# Patient Record
Sex: Male | Born: 1970 | Race: White | Hispanic: No | Smoking: Former smoker
Health system: Southern US, Community
[De-identification: ages and names within clinical notes are randomized; demographics above are authoritative.]

## PROBLEM LIST (undated history)

## (undated) DIAGNOSIS — K219 Gastro-esophageal reflux disease without esophagitis: Secondary | ICD-10-CM

## (undated) DIAGNOSIS — K589 Irritable bowel syndrome without diarrhea: Secondary | ICD-10-CM

## (undated) DIAGNOSIS — K8500 Idiopathic acute pancreatitis without necrosis or infection: Secondary | ICD-10-CM

## (undated) DIAGNOSIS — Z87891 Personal history of nicotine dependence: Secondary | ICD-10-CM

## (undated) HISTORY — PX: CHOLECYSTECTOMY: SHX55

---
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
## 2018-09-21 DIAGNOSIS — R079 Chest pain, unspecified: Secondary | ICD-10-CM | POA: Insufficient documentation

## 2018-09-21 DIAGNOSIS — J189 Pneumonia, unspecified organism: Secondary | ICD-10-CM | POA: Insufficient documentation

## 2018-09-21 DIAGNOSIS — Z720 Tobacco use: Secondary | ICD-10-CM | POA: Diagnosis present

## 2019-04-20 DIAGNOSIS — Z87891 Personal history of nicotine dependence: Secondary | ICD-10-CM

## 2019-04-20 HISTORY — DX: Personal history of nicotine dependence: Z87.891

## 2019-04-24 DIAGNOSIS — R05 Cough: Secondary | ICD-10-CM | POA: Insufficient documentation

## 2019-04-24 DIAGNOSIS — R059 Cough, unspecified: Secondary | ICD-10-CM | POA: Insufficient documentation

## 2019-05-05 DIAGNOSIS — A419 Sepsis, unspecified organism: Secondary | ICD-10-CM | POA: Insufficient documentation

## 2019-05-06 DIAGNOSIS — R197 Diarrhea, unspecified: Secondary | ICD-10-CM | POA: Insufficient documentation

## 2019-05-16 DIAGNOSIS — D72829 Elevated white blood cell count, unspecified: Secondary | ICD-10-CM | POA: Insufficient documentation

## 2019-06-09 DIAGNOSIS — K8501 Idiopathic acute pancreatitis with uninfected necrosis: Secondary | ICD-10-CM | POA: Diagnosis present

## 2019-06-20 ENCOUNTER — Encounter (HOSPITAL_COMMUNITY): Payer: Self-pay

## 2019-06-20 ENCOUNTER — Emergency Department (HOSPITAL_COMMUNITY): Payer: Self-pay

## 2019-06-20 ENCOUNTER — Inpatient Hospital Stay (HOSPITAL_COMMUNITY)
Admission: EM | Admit: 2019-06-20 | Discharge: 2019-06-28 | DRG: 438 | Disposition: A | Discharge status: Home / Self Care | Payer: Self-pay | Attending: Internal Medicine | Admitting: Internal Medicine

## 2019-06-20 ENCOUNTER — Other Ambulatory Visit: Payer: Self-pay

## 2019-06-20 DIAGNOSIS — Z9889 Other specified postprocedural states: Secondary | ICD-10-CM

## 2019-06-20 DIAGNOSIS — Z87891 Personal history of nicotine dependence: Secondary | ICD-10-CM

## 2019-06-20 DIAGNOSIS — D473 Essential (hemorrhagic) thrombocythemia: Secondary | ICD-10-CM | POA: Diagnosis present

## 2019-06-20 DIAGNOSIS — D649 Anemia, unspecified: Secondary | ICD-10-CM | POA: Diagnosis present

## 2019-06-20 DIAGNOSIS — IMO0002 Reserved for concepts with insufficient information to code with codable children: Secondary | ICD-10-CM | POA: Diagnosis present

## 2019-06-20 DIAGNOSIS — R0600 Dyspnea, unspecified: Secondary | ICD-10-CM

## 2019-06-20 DIAGNOSIS — K589 Irritable bowel syndrome without diarrhea: Secondary | ICD-10-CM | POA: Insufficient documentation

## 2019-06-20 DIAGNOSIS — K582 Mixed irritable bowel syndrome: Secondary | ICD-10-CM | POA: Diagnosis present

## 2019-06-20 DIAGNOSIS — J918 Pleural effusion in other conditions classified elsewhere: Secondary | ICD-10-CM | POA: Diagnosis present

## 2019-06-20 DIAGNOSIS — K5900 Constipation, unspecified: Secondary | ICD-10-CM

## 2019-06-20 DIAGNOSIS — R7989 Other specified abnormal findings of blood chemistry: Secondary | ICD-10-CM | POA: Diagnosis present

## 2019-06-20 DIAGNOSIS — D75839 Thrombocytosis, unspecified: Secondary | ICD-10-CM | POA: Diagnosis present

## 2019-06-20 DIAGNOSIS — K859 Acute pancreatitis without necrosis or infection, unspecified: Secondary | ICD-10-CM

## 2019-06-20 DIAGNOSIS — Z79899 Other long term (current) drug therapy: Secondary | ICD-10-CM

## 2019-06-20 DIAGNOSIS — Z20828 Contact with and (suspected) exposure to other viral communicable diseases: Secondary | ICD-10-CM | POA: Diagnosis present

## 2019-06-20 DIAGNOSIS — R1084 Generalized abdominal pain: Secondary | ICD-10-CM

## 2019-06-20 DIAGNOSIS — Z888 Allergy status to other drugs, medicaments and biological substances status: Secondary | ICD-10-CM

## 2019-06-20 DIAGNOSIS — J90 Pleural effusion, not elsewhere classified: Secondary | ICD-10-CM | POA: Diagnosis present

## 2019-06-20 DIAGNOSIS — K219 Gastro-esophageal reflux disease without esophagitis: Secondary | ICD-10-CM | POA: Insufficient documentation

## 2019-06-20 DIAGNOSIS — E46 Unspecified protein-calorie malnutrition: Secondary | ICD-10-CM | POA: Diagnosis present

## 2019-06-20 DIAGNOSIS — K861 Other chronic pancreatitis: Secondary | ICD-10-CM | POA: Diagnosis present

## 2019-06-20 DIAGNOSIS — R079 Chest pain, unspecified: Secondary | ICD-10-CM

## 2019-06-20 DIAGNOSIS — R161 Splenomegaly, not elsewhere classified: Secondary | ICD-10-CM | POA: Diagnosis present

## 2019-06-20 DIAGNOSIS — I10 Essential (primary) hypertension: Secondary | ICD-10-CM | POA: Diagnosis present

## 2019-06-20 DIAGNOSIS — Z6821 Body mass index (BMI) 21.0-21.9, adult: Secondary | ICD-10-CM

## 2019-06-20 DIAGNOSIS — K8681 Exocrine pancreatic insufficiency: Secondary | ICD-10-CM | POA: Diagnosis present

## 2019-06-20 DIAGNOSIS — Z9049 Acquired absence of other specified parts of digestive tract: Secondary | ICD-10-CM

## 2019-06-20 DIAGNOSIS — Z885 Allergy status to narcotic agent status: Secondary | ICD-10-CM

## 2019-06-20 DIAGNOSIS — K8501 Idiopathic acute pancreatitis with uninfected necrosis: Principal | ICD-10-CM | POA: Diagnosis present

## 2019-06-20 DIAGNOSIS — R109 Unspecified abdominal pain: Secondary | ICD-10-CM

## 2019-06-20 DIAGNOSIS — J189 Pneumonia, unspecified organism: Secondary | ICD-10-CM | POA: Diagnosis present

## 2019-06-20 HISTORY — DX: Idiopathic acute pancreatitis without necrosis or infection: K85.00

## 2019-06-20 HISTORY — DX: Gastro-esophageal reflux disease without esophagitis: K21.9

## 2019-06-20 HISTORY — DX: Irritable bowel syndrome, unspecified: K58.9

## 2019-06-20 LAB — CBC WITH DIFFERENTIAL/PLATELET
Abs Immature Granulocytes: 0.15 10*3/uL — ABNORMAL HIGH (ref 0.00–0.07)
Basophils Absolute: 0.1 10*3/uL (ref 0.0–0.1)
Basophils Relative: 0 %
Eosinophils Absolute: 0.1 10*3/uL (ref 0.0–0.5)
Eosinophils Relative: 0 %
HCT: 44.5 % (ref 39.0–52.0)
Hemoglobin: 14.6 g/dL (ref 13.0–17.0)
Immature Granulocytes: 1 %
Lymphocytes Relative: 9 %
Lymphs Abs: 2.5 10*3/uL (ref 0.7–4.0)
MCH: 28.6 pg (ref 26.0–34.0)
MCHC: 32.8 g/dL (ref 30.0–36.0)
MCV: 87.1 fL (ref 80.0–100.0)
Monocytes Absolute: 1.4 10*3/uL — ABNORMAL HIGH (ref 0.1–1.0)
Monocytes Relative: 5 %
Neutro Abs: 25.2 10*3/uL — ABNORMAL HIGH (ref 1.7–7.7)
Neutrophils Relative %: 85 %
Platelets: 754 10*3/uL — ABNORMAL HIGH (ref 150–400)
RBC: 5.11 MIL/uL (ref 4.22–5.81)
RDW: 12.8 % (ref 11.5–15.5)
WBC: 29.6 10*3/uL — ABNORMAL HIGH (ref 4.0–10.5)
nRBC: 0 % (ref 0.0–0.2)

## 2019-06-20 LAB — COMPREHENSIVE METABOLIC PANEL
ALT: 50 U/L — ABNORMAL HIGH (ref 0–44)
AST: 32 U/L (ref 15–41)
Albumin: 2.5 g/dL — ABNORMAL LOW (ref 3.5–5.0)
Alkaline Phosphatase: 100 U/L (ref 38–126)
Anion gap: 13 (ref 5–15)
BUN: 7 mg/dL (ref 6–20)
CO2: 23 mmol/L (ref 22–32)
Calcium: 9.2 mg/dL (ref 8.9–10.3)
Chloride: 94 mmol/L — ABNORMAL LOW (ref 98–111)
Creatinine, Ser: 0.8 mg/dL (ref 0.61–1.24)
GFR calc Af Amer: >60 mL/min (ref >60)
GFR calc non Af Amer: >60 mL/min (ref >60)
Glucose, Bld: 151 mg/dL — ABNORMAL HIGH (ref 70–99)
Potassium: 4.1 mmol/L (ref 3.5–5.1)
Sodium: 130 mmol/L — ABNORMAL LOW (ref 135–145)
Total Bilirubin: 0.9 mg/dL (ref 0.3–1.2)
Total Protein: 6.8 g/dL (ref 6.5–8.1)

## 2019-06-20 LAB — TROPONIN I (HIGH SENSITIVITY): Troponin I (High Sensitivity): 6 ng/L (ref <18)

## 2019-06-20 LAB — LACTIC ACID, PLASMA: Lactic Acid, Venous: 1.8 mmol/L (ref 0.5–1.9)

## 2019-06-20 LAB — LIPASE, BLOOD: Lipase: 181 U/L — ABNORMAL HIGH (ref 11–51)

## 2019-06-20 LAB — SARS CORONAVIRUS 2 BY RT PCR (HOSPITAL ORDER, PERFORMED IN ~~LOC~~ HOSPITAL LAB): SARS Coronavirus 2: NEGATIVE

## 2019-06-20 MED ORDER — ACETAMINOPHEN 500 MG PO TABS
1000.0000 mg | ORAL_TABLET | Freq: Once | ORAL | Status: AC
  Administered 2019-06-20: 1000 mg via ORAL
  Filled 2019-06-20: qty 2

## 2019-06-20 MED ORDER — ACETAMINOPHEN 650 MG RE SUPP: 650.0000 mg | Freq: Four times a day (QID) | RECTAL | Status: DC | PRN

## 2019-06-20 MED ORDER — SODIUM CHLORIDE 0.9 % IV SOLN
1.0000 g | Freq: Once | INTRAVENOUS | Status: AC
  Administered 2019-06-20: 1 g via INTRAVENOUS
  Filled 2019-06-20: qty 10

## 2019-06-20 MED ORDER — HYDROMORPHONE HCL 1 MG/ML IJ SOLN
1.0000 mg | Freq: Once | INTRAMUSCULAR | Status: AC
  Administered 2019-06-20: 1 mg via INTRAVENOUS
  Filled 2019-06-20: qty 1

## 2019-06-20 MED ORDER — SODIUM CHLORIDE 0.9 % IV SOLN
500.0000 mg | Freq: Once | INTRAVENOUS | Status: AC
  Administered 2019-06-20: 500 mg via INTRAVENOUS
  Filled 2019-06-20: qty 500

## 2019-06-20 MED ORDER — IOHEXOL 300 MG/ML  SOLN
100.0000 mL | Freq: Once | INTRAMUSCULAR | Status: AC | PRN
  Administered 2019-06-20: 100 mL via INTRAVENOUS

## 2019-06-20 MED ORDER — DIPHENHYDRAMINE HCL 12.5 MG/5ML PO ELIX: 12.5000 mg | ORAL_SOLUTION | Freq: Four times a day (QID) | ORAL | Status: DC | PRN

## 2019-06-20 MED ORDER — ONDANSETRON HCL 4 MG/2ML IJ SOLN
4.0000 mg | Freq: Four times a day (QID) | INTRAMUSCULAR | Status: DC | PRN
  Administered 2019-06-21 – 2019-06-28 (×3): 4 mg via INTRAVENOUS
  Filled 2019-06-20 (×2): qty 2

## 2019-06-20 MED ORDER — ONDANSETRON HCL 4 MG/2ML IJ SOLN
4.0000 mg | Freq: Once | INTRAMUSCULAR | Status: AC
  Administered 2019-06-20: 4 mg via INTRAVENOUS

## 2019-06-20 MED ORDER — DIPHENHYDRAMINE HCL 50 MG/ML IJ SOLN: 12.5000 mg | Freq: Four times a day (QID) | INTRAMUSCULAR | Status: DC | PRN

## 2019-06-20 MED ORDER — LACTATED RINGERS IV BOLUS
1000.0000 mL | Freq: Once | INTRAVENOUS | Status: AC
  Administered 2019-06-21: 1000 mL via INTRAVENOUS

## 2019-06-20 MED ORDER — ACETAMINOPHEN 325 MG PO TABS
650.0000 mg | ORAL_TABLET | Freq: Four times a day (QID) | ORAL | Status: DC | PRN
  Administered 2019-06-21 – 2019-06-24 (×3): 650 mg via ORAL
  Filled 2019-06-20 (×3): qty 2

## 2019-06-20 MED ORDER — MORPHINE SULFATE 2 MG/ML IV SOLN
INTRAVENOUS | Status: DC
  Administered 2019-06-21: 15 mg via INTRAVENOUS
  Administered 2019-06-21: 04:00:00 via INTRAVENOUS
  Filled 2019-06-20 (×2): qty 30

## 2019-06-20 MED ORDER — LACTATED RINGERS IV BOLUS
1000.0000 mL | Freq: Once | INTRAVENOUS | Status: AC
  Administered 2019-06-20: 1000 mL via INTRAVENOUS

## 2019-06-20 MED ORDER — SODIUM CHLORIDE 0.9% FLUSH: 9.0000 mL | INTRAVENOUS | Status: DC | PRN

## 2019-06-20 MED ORDER — DEXTROSE-NACL 5-0.45 % IV SOLN
INTRAVENOUS | Status: DC
  Administered 2019-06-21: 01:00:00 via INTRAVENOUS

## 2019-06-20 MED ORDER — NALOXONE HCL 0.4 MG/ML IJ SOLN: 0.4000 mg | INTRAMUSCULAR | Status: DC | PRN

## 2019-06-20 MED ORDER — ENOXAPARIN SODIUM 40 MG/0.4ML ~~LOC~~ SOLN
40.0000 mg | Freq: Every day | SUBCUTANEOUS | Status: DC
  Administered 2019-06-21: 40 mg via SUBCUTANEOUS
  Filled 2019-06-20 (×4): qty 0.4

## 2019-06-20 MED ORDER — ONDANSETRON HCL 4 MG PO TABS
4.0000 mg | ORAL_TABLET | Freq: Four times a day (QID) | ORAL | Status: DC | PRN
  Administered 2019-06-24: 4 mg via ORAL
  Filled 2019-06-20: qty 1

## 2019-06-20 NOTE — ED Notes (Signed)
4mg zofran given

## 2019-06-20 NOTE — ED Provider Notes (Signed)
I have personally seen and examined the patient. I have reviewed the documentation on PMH/FH/Soc Hx. I have discussed the plan of care with the resident and patient.  I have reviewed and agree with the resident's documentation. Please see associated encounter note.  Briefly, the patient is a 48 y.o. male here with chest pain, abdominal pain.  Patient was routed here as concern for code STEMI however EKG shows sinus tachycardia.  There is no concern for ST elevation on EKG.  Cardiology and I agreed to call off STEMI.  Patient has mostly severe epigastric abdominal pain, left-sided pain.  Has a history of idiopathic pancreatitis which is likely cause of his symptoms.  Possibly infectious.  Patient has tachycardia, intense nausea and vomiting.  Tenderness throughout on exam of his abdomen.  Sepsis work-up was initiated.  Patient with leukocytosis of 30.  Lipase elevated at 181.  Lactic acid normal.  Chest x-ray concerning for pneumonia.  IV antibiotics started. Blood cx collected. Patient with CT scan concerning for pancreatitis with fluid collections but no major abscess.  No signs of pancreatic necrosis.  Mostly has a moderate size left-sided pleural effusion as well.  Also has bowel wall thickening concerning for infectious colitis.  Will admit to medicine for further sepsis care as patient tachycardic, leukocytosis with possibly multiple sources.  Will discuss with hospitalist about expanding antibiotic coverage.  This chart was dictated using voice recognition software.  Despite best efforts to proofread,  errors can occur which can change the documentation meaning.   .Critical Care Performed by: Lennice Sites, DO Authorized by: Lennice Sites, DO   Critical care provider statement:    Critical care time (minutes):  45   Critical care was necessary to treat or prevent imminent or life-threatening deterioration of the following conditions:  Sepsis   Critical care was time spent personally by me on the  following activities:  Blood draw for specimens, development of treatment plan with patient or surrogate, discussions with primary provider, evaluation of patient's response to treatment, examination of patient, obtaining history from patient or surrogate, ordering and performing treatments and interventions, ordering and review of laboratory studies, ordering and review of radiographic studies, pulse oximetry, review of old charts and re-evaluation of patient's condition   I assumed direction of critical care for this patient from another provider in my specialty: no        EKG Interpretation  Date/Time:  Saturday June 20 2019 19:01:13 EDT Ventricular Rate:  158 PR Interval:    QRS Duration: 80 QT Interval:  266 QTC Calculation: 432 R Axis:   25 Text Interpretation:  Sinus tachycardia Atrial premature complex Confirmed by Lennice Sites [phone]) on 06/20/2019 7:07:18 PM        Lennice Sites, DO 06/20/19 2152

## 2019-06-20 NOTE — ED Triage Notes (Signed)
Per [facility], pt called 911 for Chest pain with neck pain, shob, hx idiopathic pancreatitis(recently seen at [facility] for) and having generalized left side pain, in route to [place] saw elevation in lead 2 and diverted here and activated code stemi.

## 2019-06-20 NOTE — ED Provider Notes (Signed)
[facility] EMERGENCY DEPARTMENT Provider Note   CSN: [id number] Arrival date & time: 06/20/19  1857    History   Chief Complaint Chief Complaint  Patient presents with   Chest Pain   Abdominal Pain    HPI Kyle Mooney is a 48 y.o. male.     HPI EMS as code STEMI.  Per protocol cardiology and ED providers at bedside upon arrival.  EMS reports they were called out for concerns of chest pain.  Patient reported left-sided chest pain and then in route they saw possible ST elevation in lead II on the monitor.  Patient was also tachycardic.  Administered full dose aspirin and 1 nitro.  On my interview the patient states that he had waxing on and off upper abdominal and left chest and abdominal pain this morning.  Came and went.  Was improved with hydrocodone which he has at home, but then acutely worsened.  States that it feels different than his previous pancreatitis.  Is sharp in nature.  Worse with palpation and deep breathing.  Hurts to move.  Past Medical History:  Diagnosis Date   Ex-cigarette smoker 04/2019   GERD (gastroesophageal reflux disease)    IBS (irritable bowel syndrome)    Idiopathic pancreatitis     Patient Active Problem List   Diagnosis Date Noted   Chronic pancreatitis ([place]) 06/20/2019   Thrombocytosis ([place]) 06/20/2019   LLL pneumonia ([place]) 06/20/2019   Recurrent left pleural effusion 06/20/2019   GERD (gastroesophageal reflux disease) 06/20/2019   IBS (irritable bowel syndrome) 06/20/2019   Pancreatitis, recurrent 06/20/2019    Past Surgical History:  Procedure Laterality Date   CHOLECYSTECTOMY          Home Medications    Prior to Admission medications   Not on File    Family History History reviewed. No pertinent family history.  Social History Social History   Tobacco Use   Smoking status: Former Smoker    Types: Cigarettes    Quit date: 04/20/2019    Years since quitting: 0.1  Substance Use  Topics   Alcohol use: Never    Frequency: Never   Drug use: Never     Allergies   Codeine and Other   Review of Systems Review of Systems  Constitutional: Negative for chills and fever.  HENT: Negative for sore throat.   Eyes: Negative for pain and visual disturbance.  Respiratory: Positive for cough. Negative for shortness of breath.        Persistent dry cough  Cardiovascular: Positive for chest pain.  Gastrointestinal: Positive for abdominal pain.  Genitourinary: Negative for dysuria and hematuria.  Musculoskeletal: Negative for back pain.  Skin: Negative for color change and rash.  Neurological: Negative for seizures and syncope.  All other systems reviewed and are negative.    Physical Exam Updated Vital Signs BP (!) 131/95    Pulse (!) 114    Temp 98.5 F (36.9 C) (Oral)    Resp (!) 22    Ht 6\' 1"  (1.854 m)    Wt 68 kg    SpO2 94%    BMI 19.79 kg/m   Physical Exam Vitals signs and nursing note reviewed.  Constitutional:      General: He is in acute distress.     Appearance: He is well-developed.  HENT:     Head: Normocephalic and atraumatic.  Eyes:     Conjunctiva/sclera: Conjunctivae normal.  Neck:     Musculoskeletal: Neck supple.  Cardiovascular:  Rate and Rhythm: Normal rate and regular rhythm.     Heart sounds: No murmur.  Pulmonary:     Effort: Pulmonary effort is normal. No respiratory distress.     Breath sounds: Examination of the left-lower field reveals decreased breath sounds and rhonchi. Decreased breath sounds and rhonchi present.  Chest:     Chest wall: Tenderness present.     Comments: Tenderness left-sided lower palpation Abdominal:     Palpations: Abdomen is soft.     Tenderness: There is abdominal tenderness. There is guarding.     Comments: Tenderness palpation of the epigastrium and left upper quadrant  Skin:    General: Skin is warm and dry.  Neurological:     Mental Status: He is alert and oriented to person, place, and  time.  Psychiatric:        Mood and Affect: Mood is anxious.      ED Treatments / Results  Labs (all labs ordered are listed, but only abnormal results are displayed) Labs Reviewed  CBC WITH DIFFERENTIAL/PLATELET - Abnormal; Notable for the following components:      Result Value   WBC 29.6 (*)    Platelets 754 (*)    Neutro Abs 25.2 (*)    Monocytes Absolute 1.4 (*)    Abs Immature Granulocytes 0.15 (*)    All other components within normal limits  COMPREHENSIVE METABOLIC PANEL - Abnormal; Notable for the following components:   Sodium 130 (*)    Chloride 94 (*)    Glucose, Bld 151 (*)    Albumin 2.5 (*)    ALT 50 (*)    All other components within normal limits  LIPASE, BLOOD - Abnormal; Notable for the following components:   Lipase 181 (*)    All other components within normal limits  SARS CORONAVIRUS 2 (HOSPITAL ORDER, [place] [facility])  CULTURE, BLOOD (ROUTINE X 2)  CULTURE, BLOOD (ROUTINE X 2)  URINE CULTURE  LACTIC ACID, PLASMA  URINALYSIS, ROUTINE W REFLEX MICROSCOPIC  LACTIC ACID, PLASMA  HIV ANTIBODY (ROUTINE TESTING W REFLEX)  CBC  CREATININE, SERUM  CBC  PATHOLOGIST SMEAR REVIEW  FERRITIN  C-REACTIVE PROTEIN  FIBRINOGEN  LIPASE, BLOOD  AMYLASE  TROPONIN I (HIGH SENSITIVITY)  TROPONIN I (HIGH SENSITIVITY)    EKG EKG Interpretation  Date/Time:  Saturday June 20 2019 19:01:13 EDT Ventricular Rate:  158 PR Interval:    QRS Duration: 80 QT Interval:  266 QTC Calculation: 432 R Axis:   25 Text Interpretation:  Sinus tachycardia Atrial premature complex Confirmed by Lennice Sites [phone]) on 06/20/2019 7:07:18 PM   Radiology Ct Abdomen Pelvis W Contrast  Result Date: 06/20/2019 CLINICAL DATA:  Abdominal pain.  Neutropenia. EXAM: CT ABDOMEN AND PELVIS WITH CONTRAST TECHNIQUE: Multidetector CT imaging of the abdomen and pelvis was performed using the standard protocol following bolus administration of intravenous contrast.  CONTRAST:  159mL OMNIPAQUE IOHEXOL 300 MG/ML  SOLN COMPARISON:  None. FINDINGS: Lower chest: There is a moderate-sized left-sided pleural effusion with near complete collapse of the left lower lobe.The heart size is normal. Hepatobiliary: The liver is normal. Status post cholecystectomy.There is no biliary ductal dilation. Pancreas: There are multiple peripancreatic fluid collections the largest of which measures approximately 3.7 by 1.8 cm. The pancreas appears to enhance symmetrically. Spleen: The spleen is enlarged measuring approximately 14 cm craniocaudad. Adrenals/Urinary Tract: --Adrenal glands: No adrenal hemorrhage. --Right kidney/ureter: No hydronephrosis or perinephric hematoma. --Left kidney/ureter: There is no left-sided hydronephrosis. There is a  complex collection in the left posterior pararenal space measuring approximately 8.5 x 2.5 cm. --Urinary bladder: Unremarkable. Stomach/Bowel: --Stomach/Duodenum: There is some wall thickening of the stomach. --Small bowel: No dilatation or inflammation. --Colon: There are soft tissue densities along the descending colon measuring approximately 2.8 x 2.4 cm. There is a collection of the splenic flexure measuring 4.7 x 2.7 cm causing mass effect on the nearby:Marland Kitchen There is wall thickening of the transverse colon without evidence of an obstruction. --Appendix: Not visualized. No right lower quadrant inflammation or free fluid. Vascular/Lymphatic: Atherosclerotic changes are noted of the abdominal aorta without evidence of an abdominal aortic aneurysm. The portal vein and splenic vein remain patent. The splenic artery remains patent. --No retroperitoneal lymphadenopathy. --No mesenteric lymphadenopathy. --No pelvic or inguinal lymphadenopathy. Reproductive: Unremarkable Other: There is a small volume of free fluid in the pelvis. There are multiple scattered collections in the retroperitoneum and peritoneal cavity. A few these collections demonstrate mild rim  enhancement. For example in the left upper quadrant there is a 3.9 x 1.9 cm collection that demonstrates mild peripheral rim enhancement. There is a 3.8 by 4.4 cm collection in the region of the gallbladder fossa. Given the surgical clips in the gallbladder fossa this is favored to represent a loculated fluid collection as opposed to a remnant gallbladder. Musculoskeletal. No acute displaced fractures. IMPRESSION: 1. Overall findings concerning for pancreatitis with multiple loculated fluid collections as detailed above. Some of the smaller collections, for example in the left upper quadrant, demonstrate rim enhancement. An abscess is not excluded. There is no CT evidence for pancreatic necrosis. 2. Moderate-sized left-sided pleural effusion with at least partial collapse of the left lower lobe. 3. Diffuse wall thickening of the transverse colon and splenic flexure favored to be reactive. Other considerations include infectious or inflammatory colitis. 4. Splenomegaly.  The splenic vein remains patent. Electronically Signed   By: Constance Holster M.D.   On: 06/20/2019 21:07   Dg Chest Portable 1 View  Result Date: 06/20/2019 CLINICAL DATA:  Chest pain EXAM: PORTABLE CHEST 1 VIEW COMPARISON:  None. FINDINGS: There is airspace consolidation in the left base with left pleural effusion. Lungs elsewhere are clear. Heart size and pulmonary vascularity are normal. No adenopathy. No bone lesions. IMPRESSION: Left lower lobe airspace consolidation consistent with pneumonia. Small left pleural effusion. Lungs elsewhere clear. No adenopathy. Heart size normal. Followup PA and lateral chest radiographs recommended in 3-4 weeks following trial of antibiotic therapy to ensure resolution and exclude underlying malignancy. Electronically Signed   By: Lowella Grip III M.D.   On: 06/20/2019 19:32    Procedures Procedures (including critical care time)  Medications Ordered in ED Medications  enoxaparin (LOVENOX)  injection 40 mg (has no administration in time range)  dextrose 5 %-0.45 % sodium chloride infusion (has no administration in time range)  acetaminophen (TYLENOL) tablet 650 mg (650 mg Oral Given 06/21/19 0052)    Or  acetaminophen (TYLENOL) suppository 650 mg ( Rectal See Alternative 06/21/19 0052)  ondansetron (ZOFRAN) tablet 4 mg (has no administration in time range)    Or  ondansetron (ZOFRAN) injection 4 mg (has no administration in time range)  naloxone [facility]) injection 0.4 mg (has no administration in time range)    And  sodium chloride flush (NS) 0.9 % injection 9 mL (has no administration in time range)  diphenhydrAMINE (BENADRYL) injection 12.5 mg (has no administration in time range)    Or  diphenhydrAMINE (BENADRYL) 12.5 MG/5ML elixir 12.5 mg (has no administration  in time range)  morphine 2 mg/mL PCA injection (has no administration in time range)  cefTRIAXone (ROCEPHIN) 1 g in sodium chloride 0.9 % 100 mL IVPB (has no administration in time range)  azithromycin (ZITHROMAX) 250 mg in dextrose 5 % 125 mL IVPB (has no administration in time range)  HYDROmorphone (DILAUDID) injection 1 mg (1 mg Intravenous Given 06/20/19 1920)  lactated ringers bolus 1,000 mL (0 mLs Intravenous Stopped 06/20/19 2133)  ondansetron (ZOFRAN) injection 4 mg (4 mg Intravenous Given 06/20/19 1914)  cefTRIAXone (ROCEPHIN) 1 g in sodium chloride 0.9 % 100 mL IVPB (0 g Intravenous Stopped 06/21/19 0043)  azithromycin (ZITHROMAX) 500 mg in sodium chloride 0.9 % 250 mL IVPB (0 mg Intravenous Stopped 06/21/19 0102)  acetaminophen (TYLENOL) tablet 1,000 mg (1,000 mg Oral Given 06/20/19 2100)  lactated ringers bolus 1,000 mL (1,000 mLs Intravenous New Bag/Given 06/21/19 0002)  HYDROmorphone (DILAUDID) injection 1 mg (1 mg Intravenous Given 06/20/19 2055)  iohexol (OMNIPAQUE) 300 MG/ML solution 100 mL (100 mLs Intravenous Contrast Given 06/20/19 2041)     Initial Impression / Assessment and Plan / ED Course  I have reviewed the  triage vital signs and the nursing notes.  Pertinent labs & imaging results that were available during my care of the patient were reviewed by me and considered in my medical decision making (see chart for details).       Mr. Vacha is a 48 year old male with a history of idiopathic pancreatitis, IBS, and GERD.  He was recently admitted to [facility] with CT suggesting necrotic pancreatitis with extensive fluid collections.  Was admitted on 7/14 and discharged on 7/23 after tolerating p.o. without any surgical or GI intervention.  Arrival today because of pain was unclear.  After chest x-ray became concerned for effusion consistent with pneumonia especially when I saw that his leukocytosis had increased to 29,000.  He also had significant abdominal pain and elevated lipase.  His lipase is not impressive in and of itself, but this may be due to extensive pancreatic necrosis.  CT ultimately showed findings consistent with acute pancreatitis.  Patient was treated empirically with ceftriaxone and azithromycin for pneumonia, though this effusion may be secondary to pancreatitis.  Given IV fluids, Tylenol, IV narcotics.  Overall had good response.  Continue to be tachycardic but had significant provement in symptoms.  Subsequently admitted to the inpatient hospitalist service for continued observation and management.  Final Clinical Impressions(s) / ED Diagnoses   Final diagnoses:  Acute on chronic pancreatitis ([place])  Pleural effusion associated with pancreatitis    ED Discharge Orders    None       Tillie Fantasia, MD 06/21/19 0105    Lennice Sites, DO 06/21/19 1708

## 2019-06-20 NOTE — H&P (Signed)
History and Physical    Kyle Mooney SWF:[id number] DOB: 10/06/1971 DOA: 06/20/2019  PCP: Patient, No Pcp Per (Confirm with patient/family/NH records and if not entered, this has to be entered at [facility] point of entry) Patient coming from: Patient is coming from his home and [place]  I have personally briefly reviewed patient's old medical records in [place]  Chief Complaint: Worsening abdominal pain and left flank and back pain  HPI: Kyle Mooney is a 48 y.o. male with medical history significant of recurrent pancreatitis.  He has had 5 prior hospitalizations since May 2020.  Most recent hospitalization was at [facility] 7/14 to 06/11/2019 for a diagnosis of idiopathic necrotizing pancreatitis.  The patient was seen by GI and surgical services.  He had MR CP which confirmed his necrotizing pancreatitis and also revealed multiple fluid collections in the peripancreatic region and throughout the abdomen.  He was not felt to be a candidate for a bile duct stent.  Patient during his hospitalization had a thrombocytosis which was thought to be reactive.  He had a leukocytosis also thought to be reactive.  Since being home the patient has continued to have abdominal discomfort.  He has had poor p.o. intake.  The day of admission he had a sudden increase in pain in the left flank and left lower back.  His abdominal pain also became more intense.  He was discharged from [facility] with a prescription for oxycodone and he did take this today.  Patient called EMS and asked to be taken to [facility] but he was taken instead to [facility].  He reports that his pain is severe at a 10/10 particularly in the left upper quadrant of the abdomen.  Does admit to having had a cough productive of a grayish-greenish sputum.  He denies any frank shortness of breath.    ED Course: In transfer the patient was thought to have cardiac symptoms.  Defibrillator pads were attached.   No medications were given.   In the ED his EKG was unremarkable for acute changes and his initial troponin was unremarkable.  Lab work did reveal an elevated lipase at 181.  He had a marked leukocytosis at 29,500 with a normal differential.  CT scan of the abdomen revealed changes consistent with pancreatitis.  He had multiple fluid collections in the peripancreatic region and also throughout the abdomen.  There were several thick walled fluid collections but no evidence of abscess.  The transverse colon was somewhat thickened.  The spleen was enlarged and measured 14.5 cm.  There was a moderate left pleural effusion and partial collapse of the left lung.  Chest x-ray revealed left lower lobe density consistent with pneumonia and a small left pleural effusion.  [facility] were called to admit the patient for recurrent pancreatitis and left lower lobe pneumonia.  In the ED the patient was given a dose of ceftriaxone and azithromycin.  Review of Systems: As per HPI otherwise 10 point review of systems negative.  Patient does report an almost 50 pound weight loss since May.   Past Medical History:  Diagnosis Date   Ex-cigarette smoker 04/2019   GERD (gastroesophageal reflux disease)    IBS (irritable bowel syndrome)    Idiopathic pancreatitis     Past Surgical History:  Procedure Laterality Date   CHOLECYSTECTOMY       reports that he quit smoking about 2 months ago. His smoking use included cigarettes. He does not have any smokeless tobacco history on file.  He reports that he does not drink alcohol or use drugs.  Allergies  Allergen Reactions   Codeine    Other     prolixa    History reviewed. No pertinent family history.  Social Hx - HSG. Married x 2: he had a son and two daughters from first marriage. His son died 2/2 complications of DM and opioid abuse. Married for 8 years to 2nd wife but recently seperated since his illness. 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  No family in [place].  Prior to Admission medications   Not on File    Physical Exam: Vitals:   06/20/19 2104 06/20/19 2105 06/20/19 2200 06/20/19 2300  BP:   (!) 142/96 (!) 131/95  Pulse: (!) 122  (!) 120 (!) 114  Resp:  (!) 22 (!) 31 (!) 22  Temp:      TempSrc:      SpO2: 98%  93% 94%  Weight:      Height:        Constitutional: NAD, calm, comfortable Vitals:   06/20/19 2104 06/20/19 2105 06/20/19 2200 06/20/19 2300  BP:   (!) 142/96 (!) 131/95  Pulse: (!) 122  (!) 120 (!) 114  Resp:  (!) 22 (!) 31 (!) 22  Temp:      TempSrc:      SpO2: 98%  93% 94%  Weight:      Height:       General  Chronically ill-appearing gentleman who is uncomfortable  eyes: PERRL, lids and conjunctivae normal without icterus ENMT: Mucous membranes are moist. Posterior pharynx clear of any exudate or lesions. dentition. -Fair condition Neck: normal, supple, no masses, no thyromegaly Respiratory: Right lung is clear with no rales or rhonchi.  Absent breath sounds at the left base up midway of the chest.  No wheezing is noted no rales are noted  cardiovascular: Regular tachycardia,  no murmurs / rubs / gallops. No extremity edema. 2+ pedal pulses. No carotid bruits.  Abdomen: Absent to hypoactive bowel sounds throughout.  Diffuse abdominal tenderness and protuberance.  Greatest point of tenderness is in the epigastrium.  Patient is very tender in the left upper quadrant.  Patient does guard, does have rebound.  Could not palpate his spleen edge secondary to tenderness.  Genitalia exam deferred Musculoskeletal: no clubbing / cyanosis. No joint deformity upper and lower extremities. Good ROM, no contractures. Normal muscle tone.  Skin: no rashes, lesions, ulcers. No induration.  Chronic scarring, small dark brown circular lesions, distal lower extremity Neurologic: CN 2-12 grossly intact. Sensation intact,  Strength 5/5 in all 4.  Psychiatric: Normal judgment and insight. Alert and oriented x 3. Normal mood.       Labs on Admission: I have personally reviewed following labs and imaging studies  CBC: Recent Labs  Lab 06/20/19 1915  WBC 29.6*  NEUTROABS 25.2*  HGB 14.6  HCT 44.5  MCV 87.1  PLT 093*   Basic Metabolic Panel: Recent Labs  Lab 06/20/19 1915  NA 130*  K 4.1  CL 94*  CO2 23  GLUCOSE 151*  BUN 7  CREATININE 0.80  CALCIUM 9.2   GFR: Estimated Creatinine Clearance: 109.8 mL/min (by C-G formula based on SCr of 0.8 mg/dL). Liver Function Tests: Recent Labs  Lab 06/20/19 1915  AST 32  ALT 50*  ALKPHOS 100  BILITOT 0.9  PROT 6.8  ALBUMIN 2.5*   Recent Labs  Lab 06/20/19 1915  LIPASE 181*  No results for input(s): AMMONIA in the last 168 hours. Coagulation Profile: No results for input(s): INR, PROTIME in the last 168 hours. Cardiac Enzymes: No results for input(s): CKTOTAL, CKMB, CKMBINDEX, TROPONINI in the last 168 hours. BNP (last 3 results) No results for input(s): PROBNP in the last 8760 hours. HbA1C: No results for input(s): HGBA1C in the last 72 hours. CBG: No results for input(s): GLUCAP in the last 168 hours. Lipid Profile: No results for input(s): CHOL, HDL, LDLCALC, TRIG, CHOLHDL, LDLDIRECT in the last 72 hours. Thyroid Function Tests: No results for input(s): TSH, T4TOTAL, FREET4, T3FREE, THYROIDAB in the last 72 hours. Anemia Panel: No results for input(s): VITAMINB12, FOLATE, FERRITIN, TIBC, IRON, RETICCTPCT in the last 72 hours. Urine analysis: No results found for: COLORURINE, APPEARANCEUR, [place], [place], GLUCOSEU, HGBUR, BILIRUBINUR, KETONESUR, PROTEINUR, UROBILINOGEN, NITRITE, LEUKOCYTESUR  Radiological Exams on Admission: Ct Abdomen Pelvis W Contrast  Result Date: 06/20/2019 CLINICAL DATA:  Abdominal pain.  Neutropenia. EXAM: CT ABDOMEN AND PELVIS WITH CONTRAST TECHNIQUE: Multidetector CT imaging of the abdomen and pelvis was performed using the standard protocol following bolus administration of intravenous contrast. CONTRAST:   180mL OMNIPAQUE IOHEXOL 300 MG/ML  SOLN COMPARISON:  None. FINDINGS: Lower chest: There is a moderate-sized left-sided pleural effusion with near complete collapse of the left lower lobe.The heart size is normal. Hepatobiliary: The liver is normal. Status post cholecystectomy.There is no biliary ductal dilation. Pancreas: There are multiple peripancreatic fluid collections the largest of which measures approximately 3.7 by 1.8 cm. The pancreas appears to enhance symmetrically. Spleen: The spleen is enlarged measuring approximately 14 cm craniocaudad. Adrenals/Urinary Tract: --Adrenal glands: No adrenal hemorrhage. --Right kidney/ureter: No hydronephrosis or perinephric hematoma. --Left kidney/ureter: There is no left-sided hydronephrosis. There is a complex collection in the left posterior pararenal space measuring approximately 8.5 x 2.5 cm. --Urinary bladder: Unremarkable. Stomach/Bowel: --Stomach/Duodenum: There is some wall thickening of the stomach. --Small bowel: No dilatation or inflammation. --Colon: There are soft tissue densities along the descending colon measuring approximately 2.8 x 2.4 cm. There is a collection of the splenic flexure measuring 4.7 x 2.7 cm causing mass effect on the nearby:Marland Kitchen There is wall thickening of the transverse colon without evidence of an obstruction. --Appendix: Not visualized. No right lower quadrant inflammation or free fluid. Vascular/Lymphatic: Atherosclerotic changes are noted of the abdominal aorta without evidence of an abdominal aortic aneurysm. The portal vein and splenic vein remain patent. The splenic artery remains patent. --No retroperitoneal lymphadenopathy. --No mesenteric lymphadenopathy. --No pelvic or inguinal lymphadenopathy. Reproductive: Unremarkable Other: There is a small volume of free fluid in the pelvis. There are multiple scattered collections in the retroperitoneum and peritoneal cavity. A few these collections demonstrate mild rim enhancement. For  example in the left upper quadrant there is a 3.9 x 1.9 cm collection that demonstrates mild peripheral rim enhancement. There is a 3.8 by 4.4 cm collection in the region of the gallbladder fossa. Given the surgical clips in the gallbladder fossa this is favored to represent a loculated fluid collection as opposed to a remnant gallbladder. Musculoskeletal. No acute displaced fractures. IMPRESSION: 1. Overall findings concerning for pancreatitis with multiple loculated fluid collections as detailed above. Some of the smaller collections, for example in the left upper quadrant, demonstrate rim enhancement. An abscess is not excluded. There is no CT evidence for pancreatic necrosis. 2. Moderate-sized left-sided pleural effusion with at least partial collapse of the left lower lobe. 3. Diffuse wall thickening of the transverse colon and splenic flexure favored to be  reactive. Other considerations include infectious or inflammatory colitis. 4. Splenomegaly.  The splenic vein remains patent. Electronically Signed   By: Constance Holster M.D.   On: 06/20/2019 21:07   Dg Chest Portable 1 View  Result Date: 06/20/2019 CLINICAL DATA:  Chest pain EXAM: PORTABLE CHEST 1 VIEW COMPARISON:  None. FINDINGS: There is airspace consolidation in the left base with left pleural effusion. Lungs elsewhere are clear. Heart size and pulmonary vascularity are normal. No adenopathy. No bone lesions. IMPRESSION: Left lower lobe airspace consolidation consistent with pneumonia. Small left pleural effusion. Lungs elsewhere clear. No adenopathy. Heart size normal. Followup PA and lateral chest radiographs recommended in 3-4 weeks following trial of antibiotic therapy to ensure resolution and exclude underlying malignancy. Electronically Signed   By: Lowella Grip III M.D.   On: 06/20/2019 19:32    EKG: Independently reviewed.  Sinus tachycardia with atrial premature beats.  No acute changes  Assessment/Plan Active Problems:    Chronic pancreatitis (HCC)   LLL pneumonia (HCC)   Recurrent left pleural effusion   Thrombocytosis (HCC)   Pancreatitis, recurrent  (please populate well all problems here in Problem List. (For example, if patient is on BP meds at home and you resume or decide to hold them, it is a problem that needs to be her. Same for CAD, COPD, HLD and so on)   1.  Recurrent/chronic pancreatitis -this is the patient's sixth hospitalization since May.  At his last admission to [facility] he was diagnosed as having necrotizing pancreatitis with multiple areas of loculation in the peripancreatic region and throughout the abdomen.  He is status post cholecystectomy and has no evidence of common bile duct stone by MRCP.  He does not have a history of heavy alcohol consumption and does not drink in the recent past.  His pancreatitis is idiopathic.  At his last hospitalization a common bile duct stent was discussed but he was not felt a candidate for that procedure at that time. Plan IV hydration while n.p.o.  Pain control using a PCA pump  GI consult -to be called in the a.m.  2.  Thrombocytosis -patient with a marked thrombocytosis at 754,000.  His hospital record from [facility] revealed he had a persistent thrombocytosis at that time that was thought to be reactive.  Patient now has splenomegaly.  Would be concerned for underlying hematologic disorder. Plan pathologist to review blood smear  Additional lab work to include a ferritin CRP fibrinogen  3.  Left lower lobe pneumonia -by chest x-ray and CT scan the patient has some compressive loss of lung volume secondary to a moderate pleural effusion and infiltrative  density at the left lower lobe.  Patient is afebrile but does have a leukocytosis. Plan continue ceftriaxone and azithromycin  Patient may come to thoracentesis for both diagnostic and therapeutic reasons  DVT prophylaxis: Lovenox (Lovenox/Heparin/SCD's/anticoagulated/None (if comfort care) Code  Status: Code (Full/Partial (specify details) Family Communication: Patient had no family he wanted contacted.  He does understand his diagnosis and plan of treatment and is agreeable (Specify name, relationship. Do not write "discussed with patient". Specify tel # if discussed over the phone) Disposition Plan: Home in 5 to 7 days (specify when and where you expect patient to be discharged) Consults called: GI consult to be called in a.m. (with names) Admission status: Inpatient (inpatient / obs / tele / medical floor / SDU)   Adella Hare MD [facility] Pager [phone]  If 7PM-7AM, please contact night-coverage [email or web address] Password [email or web address]  06/21/2019, 12:07 AM

## 2019-06-20 NOTE — ED Notes (Signed)
Patient transported to CT 

## 2019-06-20 NOTE — ED Notes (Signed)
Stemi cancelled by Cardiology.

## 2019-06-21 ENCOUNTER — Encounter (HOSPITAL_COMMUNITY): Payer: Self-pay | Admitting: Internal Medicine

## 2019-06-21 ENCOUNTER — Inpatient Hospital Stay (HOSPITAL_COMMUNITY): Payer: Self-pay

## 2019-06-21 DIAGNOSIS — R935 Abnormal findings on diagnostic imaging of other abdominal regions, including retroperitoneum: Secondary | ICD-10-CM

## 2019-06-21 DIAGNOSIS — K869 Disease of pancreas, unspecified: Secondary | ICD-10-CM

## 2019-06-21 DIAGNOSIS — J181 Lobar pneumonia, unspecified organism: Secondary | ICD-10-CM

## 2019-06-21 DIAGNOSIS — K8501 Idiopathic acute pancreatitis with uninfected necrosis: Principal | ICD-10-CM

## 2019-06-21 LAB — CBC
HCT: 37.5 % — ABNORMAL LOW (ref 39.0–52.0)
Hemoglobin: 11.9 g/dL — ABNORMAL LOW (ref 13.0–17.0)
MCH: 28.3 pg (ref 26.0–34.0)
MCHC: 31.7 g/dL (ref 30.0–36.0)
MCV: 89.3 fL (ref 80.0–100.0)
Platelets: 564 10*3/uL — ABNORMAL HIGH (ref 150–400)
RBC: 4.2 MIL/uL — ABNORMAL LOW (ref 4.22–5.81)
RDW: 13 % (ref 11.5–15.5)
WBC: 21.1 10*3/uL — ABNORMAL HIGH (ref 4.0–10.5)
nRBC: 0 % (ref 0.0–0.2)

## 2019-06-21 LAB — URINALYSIS, ROUTINE W REFLEX MICROSCOPIC
Bacteria, UA: NONE SEEN
Bilirubin Urine: NEGATIVE
Glucose, UA: NEGATIVE mg/dL
Hgb urine dipstick: NEGATIVE
Ketones, ur: NEGATIVE mg/dL
Leukocytes,Ua: NEGATIVE
Nitrite: NEGATIVE
Protein, ur: 30 mg/dL — AB
Specific Gravity, Urine: >1.046 — ABNORMAL HIGH (ref 1.005–1.030)
pH: 5 (ref 5.0–8.0)

## 2019-06-21 LAB — HIV ANTIBODY (ROUTINE TESTING W REFLEX): HIV Screen 4th Generation wRfx: NONREACTIVE

## 2019-06-21 LAB — CREATININE, SERUM
Creatinine, Ser: 0.73 mg/dL (ref 0.61–1.24)
GFR calc Af Amer: >60 mL/min (ref >60)
GFR calc non Af Amer: >60 mL/min (ref >60)

## 2019-06-21 LAB — FERRITIN: Ferritin: 553 ng/mL — ABNORMAL HIGH (ref 24–336)

## 2019-06-21 LAB — FIBRINOGEN: Fibrinogen: 545 mg/dL — ABNORMAL HIGH (ref 210–475)

## 2019-06-21 LAB — C-REACTIVE PROTEIN: CRP: 12.5 mg/dL — ABNORMAL HIGH (ref <1.0)

## 2019-06-21 LAB — TROPONIN I (HIGH SENSITIVITY)
Troponin I (High Sensitivity): 64 ng/L — ABNORMAL HIGH (ref <18)
Troponin I (High Sensitivity): 8 ng/L (ref <18)
Troponin I (High Sensitivity): 5 ng/L (ref <18)

## 2019-06-21 LAB — LACTIC ACID, PLASMA: Lactic Acid, Venous: 1.3 mmol/L (ref 0.5–1.9)

## 2019-06-21 LAB — AMYLASE: Amylase: 354 U/L — ABNORMAL HIGH (ref 28–100)

## 2019-06-21 LAB — LIPASE, BLOOD: Lipase: 161 U/L — ABNORMAL HIGH (ref 11–51)

## 2019-06-21 MED ORDER — ENSURE ENLIVE PO LIQD
237.0000 mL | Freq: Two times a day (BID) | ORAL | Status: DC
  Administered 2019-06-22 – 2019-06-26 (×4): 237 mL via ORAL

## 2019-06-21 MED ORDER — DIPHENHYDRAMINE HCL 12.5 MG/5ML PO ELIX: 12.5000 mg | ORAL_SOLUTION | Freq: Four times a day (QID) | ORAL | Status: DC | PRN

## 2019-06-21 MED ORDER — ONDANSETRON HCL 4 MG/2ML IJ SOLN
4.0000 mg | Freq: Four times a day (QID) | INTRAMUSCULAR | Status: DC | PRN
  Filled 2019-06-21: qty 2

## 2019-06-21 MED ORDER — HYDROMORPHONE HCL 1 MG/ML IJ SOLN
1.0000 mg | INTRAMUSCULAR | Status: DC | PRN
  Administered 2019-06-21: 1 mg via INTRAVENOUS
  Filled 2019-06-21: qty 1

## 2019-06-21 MED ORDER — SODIUM CHLORIDE 0.9 % IV SOLN
INTRAVENOUS | Status: DC
  Administered 2019-06-21 – 2019-06-25 (×10): via INTRAVENOUS

## 2019-06-21 MED ORDER — SODIUM CHLORIDE 0.9 % IV BOLUS
2000.0000 mL | Freq: Once | INTRAVENOUS | Status: AC
  Administered 2019-06-21: 2000 mL via INTRAVENOUS

## 2019-06-21 MED ORDER — DEXTROSE 5 % IV SOLN
250.0000 mg | INTRAVENOUS | Status: AC
  Administered 2019-06-21 – 2019-06-24 (×4): 250 mg via INTRAVENOUS
  Filled 2019-06-21 (×4): qty 250

## 2019-06-21 MED ORDER — MORPHINE SULFATE (PF) 4 MG/ML IV SOLN
4.0000 mg | INTRAVENOUS | Status: DC | PRN
  Administered 2019-06-21: 4 mg via INTRAVENOUS
  Filled 2019-06-21: qty 1

## 2019-06-21 MED ORDER — SODIUM CHLORIDE 0.9 % IV SOLN
1.0000 g | INTRAVENOUS | Status: DC
  Filled 2019-06-21: qty 10

## 2019-06-21 MED ORDER — MORPHINE SULFATE 2 MG/ML IV SOLN
INTRAVENOUS | Status: DC
  Administered 2019-06-21: 19.28 mg via INTRAVENOUS
  Administered 2019-06-21: 14 mg via INTRAVENOUS
  Administered 2019-06-21: 22 mg via INTRAVENOUS
  Administered 2019-06-21: 24 mg via INTRAVENOUS
  Administered 2019-06-21: 10:00:00 via INTRAVENOUS
  Administered 2019-06-22: 6 mg via INTRAVENOUS
  Administered 2019-06-22: 20 mg via INTRAVENOUS
  Administered 2019-06-22: 30 mg via INTRAVENOUS
  Administered 2019-06-22: 20 mg via INTRAVENOUS
  Administered 2019-06-22: 30 mg via INTRAVENOUS
  Administered 2019-06-23: 16 mg via INTRAVENOUS
  Administered 2019-06-23: 14 mg via INTRAVENOUS
  Administered 2019-06-23: 16.97 mg via INTRAVENOUS
  Administered 2019-06-23: 16 mg via INTRAVENOUS
  Administered 2019-06-23: 10 mg via INTRAVENOUS
  Administered 2019-06-23: 22 mg via INTRAVENOUS
  Administered 2019-06-24: 04:00:00 via INTRAVENOUS
  Administered 2019-06-24: 2 mg via INTRAVENOUS
  Administered 2019-06-24: 6 mg via INTRAVENOUS
  Administered 2019-06-24: 24 mg via INTRAVENOUS
  Filled 2019-06-21 (×6): qty 30

## 2019-06-21 MED ORDER — KETOROLAC TROMETHAMINE 30 MG/ML IJ SOLN
30.0000 mg | Freq: Four times a day (QID) | INTRAMUSCULAR | Status: DC
  Administered 2019-06-21 – 2019-06-22 (×4): 30 mg via INTRAVENOUS
  Filled 2019-06-21 (×4): qty 1

## 2019-06-21 MED ORDER — PIPERACILLIN-TAZOBACTAM 3.375 G IVPB
3.3750 g | Freq: Three times a day (TID) | INTRAVENOUS | Status: DC
  Administered 2019-06-21 – 2019-06-26 (×15): 3.375 g via INTRAVENOUS
  Filled 2019-06-21 (×14): qty 50

## 2019-06-21 MED ORDER — MORPHINE SULFATE (PF) 4 MG/ML IV SOLN: 4.0000 mg | INTRAVENOUS | Status: DC | PRN

## 2019-06-21 MED ORDER — BACLOFEN 10 MG PO TABS
5.0000 mg | ORAL_TABLET | Freq: Three times a day (TID) | ORAL | Status: DC
  Administered 2019-06-21 – 2019-06-22 (×3): 5 mg via ORAL
  Filled 2019-06-21 (×3): qty 1

## 2019-06-21 MED ORDER — SODIUM CHLORIDE 0.9% FLUSH: 9.0000 mL | INTRAVENOUS | Status: DC | PRN

## 2019-06-21 MED ORDER — NALOXONE HCL 0.4 MG/ML IJ SOLN: 0.4000 mg | INTRAMUSCULAR | Status: DC | PRN

## 2019-06-21 MED ORDER — PANTOPRAZOLE SODIUM 40 MG IV SOLR
40.0000 mg | Freq: Every day | INTRAVENOUS | Status: DC
  Administered 2019-06-21 – 2019-06-22 (×2): 40 mg via INTRAVENOUS
  Filled 2019-06-21 (×2): qty 40

## 2019-06-21 MED ORDER — DIPHENHYDRAMINE HCL 50 MG/ML IJ SOLN: 12.5000 mg | Freq: Four times a day (QID) | INTRAMUSCULAR | Status: DC | PRN

## 2019-06-21 NOTE — Progress Notes (Signed)
At approximately 0730 Patient c/o 14/10 abdominal pain, burning sensation and chest pain. Patient heart rate sustained in 120's. PCA transfusing per orders. MD paged and notified. New orders received. Will continue to monitor closely for remainder of shift.

## 2019-06-21 NOTE — Progress Notes (Signed)
Per HPI: Kyle Mooney is a 48 y.o. male with medical history significant of recurrent pancreatitis.  He has had 5 prior hospitalizations since May 2020.  Most recent hospitalization was at [facility] 7/14 to 06/11/2019 for a diagnosis of idiopathic necrotizing pancreatitis.  The patient was seen by GI and surgical services.  He had MR CP which confirmed his necrotizing pancreatitis and also revealed multiple fluid collections in the peripancreatic region and throughout the abdomen.  He was not felt to be a candidate for a bile duct stent.  Patient during his hospitalization had a thrombocytosis which was thought to be reactive.  He had a leukocytosis also thought to be reactive.  Since being home the patient has continued to have abdominal discomfort.  He has had poor p.o. intake.  The day of admission he had a sudden increase in pain in the left flank and left lower back.  His abdominal pain also became more intense.  He was discharged from [facility] with a prescription for oxycodone and he did take this today.  Patient called EMS and asked to be taken to [facility] but he was taken instead to [facility].  He reports that his pain is severe at a 10/10 particularly in the left upper quadrant of the abdomen.  Does admit to having had a cough productive of a grayish-greenish sputum.  He denies any frank shortness of breath.    I have seen and evaluated the patient at bedside.  Patient was admitted for recurrent acute on chronic pancreatitis with recent hospitalization at [facility] where he was noted to have necrotizing pancreatitis.  He has also had recent MRCP and placement of a bile duct stent was discussed at his last hospitalization, but was not performed.  We will plan to maintain on morphine PCA for pain control and remain n.p.o.  He has only received 1 L fluid bolus in the ED and I will bolus another 2 L of normal saline at this time and increase IV fluid infusion rate after the bolus.   Change antibiotics from Rocephin to Zosyn to include abdominal coverage and still maintain on azithromycin for atypical coverage could be related to left lower lobe pneumonia.  Will consult GI to assist with management.  Currently, without shortness of breath or hypoxemia and does not appear to require thoracentesis just yet, but may consider during this hospitalization.  Repeat a.m. labs ordered.  Total care time: 35 minutes.

## 2019-06-21 NOTE — Progress Notes (Signed)
Wasted 40ml morphine from PCA into steri cycle with Designer, television/film set.

## 2019-06-21 NOTE — Plan of Care (Signed)
?  Problem: Elimination: ?Goal: Will not experience complications related to urinary retention ?Outcome: Progressing ?  ?

## 2019-06-21 NOTE — ED Notes (Signed)
ED TO INPATIENT HANDOFF REPORT  ED Nurse Name and Phone #: Caprice Kluver [phone]  S Name/Age/Gender Gerald Stabs 48 y.o. male Room/Bed: 038C/038C  Code Status   Code Status: Full Code  Home/SNF/Other Home Patient oriented to: self, place, time and situation Is this baseline? Yes   Triage Complete: Triage complete  Chief Complaint Stemi  Triage Note Per [facility], pt called 911 for Chest pain with neck pain, shob, hx idiopathic pancreatitis(recently seen at [facility]) and having generalized left side pain, in route to [facility] saw elevation in lead 2 and diverted here and activated code stemi.    Allergies Allergies  Allergen Reactions  . Codeine   . Other     prolixa    Level of Care/Admitting Diagnosis ED Disposition    ED Disposition Condition Comment   Admit  Hospital Area: [place] [[id number]]  Level of Care: Med-Surg [16]  Covid Evaluation: Confirmed COVID Negative  Diagnosis: Pancreatitis, recurrent [[id number]]  Admitting Physician: Neena Rhymes [[id number]]  Attending Physician: Adella Hare E [[id number]]  Estimated length of stay: 3 - 4 days  Certification:: I certify this patient will need inpatient services for at least 2 midnights  PT Class (Do Not Modify): Inpatient [101]  PT Acc Code (Do Not Modify): Private [1]       B Medical/Surgery History Past Medical History:  Diagnosis Date  . Ex-cigarette smoker 04/2019  . GERD (gastroesophageal reflux disease)   . IBS (irritable bowel syndrome)   . Idiopathic pancreatitis    Past Surgical History:  Procedure Laterality Date  . CHOLECYSTECTOMY       A IV Location/Drains/Wounds Patient Lines/Drains/Airways Status   Active Line/Drains/Airways    Name:   Placement date:   Placement time:   Site:   Days:   Peripheral IV 06/20/19 Left Antecubital   06/20/19    -    Antecubital   1   Peripheral IV 06/20/19 Left Wrist   06/20/19    1910    Wrist   1          Intake/Output  Last 24 hours  Intake/Output Summary (Last 24 hours) at 06/21/2019 0229 Last data filed at 06/21/2019 0110 Gross per 24 hour  Intake 1350 ml  Output -  Net 1350 ml    Labs/Imaging Results for orders placed or performed during the hospital encounter of 06/20/19 (from the past 48 hour(s))  Lactic acid, plasma     Status: None   Collection Time: 06/20/19  7:10 PM  Result Value Ref Range   Lactic Acid, Venous 1.8 0.5 - 1.9 mmol/L    Comment: Performed at [facility], [place] [address], [place], [place] [address]  CBC with Differential     Status: Abnormal   Collection Time: 06/20/19  7:15 PM  Result Value Ref Range   WBC 29.6 (H) 4.0 - 10.5 K/uL   RBC 5.11 4.22 - 5.81 MIL/uL   Hemoglobin 14.6 13.0 - 17.0 g/dL   HCT 44.5 39.0 - 52.0 %   MCV 87.1 80.0 - 100.0 fL   MCH 28.6 26.0 - 34.0 pg   MCHC 32.8 30.0 - 36.0 g/dL   RDW 12.8 11.5 - 15.5 %   Platelets 754 (H) 150 - 400 K/uL   nRBC 0.0 0.0 - 0.2 %   Neutrophils Relative % 85 %   Neutro Abs 25.2 (H) 1.7 - 7.7 K/uL   Lymphocytes Relative 9 %   Lymphs Abs 2.5 0.7 -  4.0 K/uL   Monocytes Relative 5 %   Monocytes Absolute 1.4 (H) 0.1 - 1.0 K/uL   Eosinophils Relative 0 %   Eosinophils Absolute 0.1 0.0 - 0.5 K/uL   Basophils Relative 0 %   Basophils Absolute 0.1 0.0 - 0.1 K/uL   WBC Morphology See Note     Comment: Dohle Bodies Vaculated Neutrophils    Immature Granulocytes 1 %   Abs Immature Granulocytes 0.15 (H) 0.00 - 0.07 K/uL    Comment: Performed at [place] [address] [place], [place] [address]  Comprehensive metabolic panel     Status: Abnormal   Collection Time: 06/20/19  7:15 PM  Result Value Ref Range   Sodium 130 (L) 135 - 145 mmol/L   Potassium 4.1 3.5 - 5.1 mmol/L   Chloride 94 (L) 98 - 111 mmol/L   CO2 23 22 - 32 mmol/L   Glucose, Bld 151 (H) 70 - 99 mg/dL   BUN 7 6 - 20 mg/dL   Creatinine, Ser 0.80 0.61 - 1.24 mg/dL   Calcium 9.2 8.9 - 10.3 mg/dL   Total Protein 6.8 6.5 - 8.1 g/dL    Albumin 2.5 (L) 3.5 - 5.0 g/dL   AST 32 15 - 41 U/L   ALT 50 (H) 0 - 44 U/L   Alkaline Phosphatase 100 38 - 126 U/L   Total Bilirubin 0.9 0.3 - 1.2 mg/dL   GFR calc non Af Amer >60 >60 mL/min   GFR calc Af Amer >60 >60 mL/min   Anion gap 13 5 - 15    Comment: Performed at [facility], [place] [address] [place], [place] [address]  Lipase, blood     Status: Abnormal   Collection Time: 06/20/19  7:15 PM  Result Value Ref Range   Lipase 181 (H) 11 - 51 U/L    Comment: Performed at [facility], [place] [address], [place], [place] [address]  Troponin I (High Sensitivity)     Status: None   Collection Time: 06/20/19  7:15 PM  Result Value Ref Range   Troponin I (High Sensitivity) 6 <18 ng/L    Comment: (NOTE) Elevated high sensitivity troponin I (hsTnI) values and significant  changes across serial measurements may suggest ACS but many other  chronic and acute conditions are known to elevate hsTnI results.  Refer to the "Links" section for chest pain algorithms and additional  guidance. Performed at [facility], [place] [address], [place],  [address]   SARS Coronavirus 2 [facility] order, Performed in [facility] hospital lab) Nasopharyngeal Nasopharyngeal Swab     Status: None   Collection Time: 06/20/19  9:33 PM   Specimen: Nasopharyngeal Swab  Result Value Ref Range   SARS Coronavirus 2 NEGATIVE NEGATIVE    Comment: (NOTE) If result is NEGATIVE SARS-CoV-2 target nucleic acids are NOT DETECTED. The SARS-CoV-2 RNA is generally detectable in upper and lower  respiratory specimens during the acute phase of infection. The lowest  concentration of SARS-CoV-2 viral copies this assay can detect is 250  copies / mL. A negative result does not preclude SARS-CoV-2 infection  and should not be used as the sole basis for treatment or other  patient management decisions.  A negative result may occur with  improper specimen collection / handling, submission of specimen other   than nasopharyngeal swab, presence of viral mutation(s) within the  areas targeted by this assay, and inadequate number of viral copies  (<250  copies / mL). A negative result must be combined with clinical  observations, patient history, and epidemiological information. If result is POSITIVE SARS-CoV-2 target nucleic acids are DETECTED. The SARS-CoV-2 RNA is generally detectable in upper and lower  respiratory specimens dur ing the acute phase of infection.  Positive  results are indicative of active infection with SARS-CoV-2.  Clinical  correlation with patient history and other diagnostic information is  necessary to determine patient infection status.  Positive results do  not rule out bacterial infection or co-infection with other viruses. If result is PRESUMPTIVE POSTIVE SARS-CoV-2 nucleic acids MAY BE PRESENT.   A presumptive positive result was obtained on the submitted specimen  and confirmed on repeat testing.  While 2019 novel coronavirus  (SARS-CoV-2) nucleic acids may be present in the submitted sample  additional confirmatory testing may be necessary for epidemiological  and / or clinical management purposes  to differentiate between  SARS-CoV-2 and other Sarbecovirus currently known to infect humans.  If clinically indicated additional testing with an alternate test  methodology [phone]) is advised. The SARS-CoV-2 RNA is generally  detectable in upper and lower respiratory sp ecimens during the acute  phase of infection. The expected result is Negative. Fact Sheet for Patients:  StrictlyIdeas.no Fact Sheet for Healthcare Providers: BankingDealers.co.za This test is not yet approved or cleared by the [place] FDA and has been authorized for detection and/or diagnosis of SARS-CoV-2 by FDA under an Emergency Use Authorization (EUA).  This EUA will remain in effect (meaning this test can be used) for the duration of  the COVID-19 declaration under Section 564(b)(1) of the Act, 21 U.S.C. section 360bbb-3(b)(1), unless the authorization is terminated or revoked sooner. Performed at [facility], [place] [address], [place], [place] [address]   Lactic acid, plasma     Status: None   Collection Time: 06/21/19  1:06 AM  Result Value Ref Range   Lactic Acid, Venous 1.3 0.5 - 1.9 mmol/L    Comment: Performed at [place] [address], [place], [place] [address]  Troponin I (High Sensitivity)     Status: Abnormal   Collection Time: 06/21/19  1:06 AM  Result Value Ref Range   Troponin I (High Sensitivity) 64 (H) <18 ng/L    Comment: RESULT CALLED TO, READ BACK BY AND VERIFIED WITH: B.OLDLAND,RN 0220 06/21/2019 M.CAMPBELL (NOTE) Elevated high sensitivity troponin I (hsTnI) values and significant  changes across serial measurements may suggest ACS but many other  chronic and acute conditions are known to elevate hsTnI results.  Refer to the Links section for chest pain algorithms and additional  guidance. Performed at [facility], [place] [address], [place], [place] [address]   Creatinine, serum     Status: None   Collection Time: 06/21/19  1:06 AM  Result Value Ref Range   Creatinine, Ser 0.73 0.61 - 1.24 mg/dL   GFR calc non Af Amer >60 >60 mL/min   GFR calc Af Amer >60 >60 mL/min    Comment: Performed at [place] [address] [place], [place] [address]  Ferritin     Status: Abnormal   Collection Time: 06/21/19  1:06 AM  Result Value Ref Range   Ferritin 553 (H) 24 - 336 ng/mL    Comment: Performed at [facility], [place] [address], [place], [place] [address]  C-reactive protein     Status: Abnormal   Collection Time: 06/21/19  1:06 AM  Result Value  Ref Range   CRP 12.5 (H) <1.0 mg/dL    Comment: Performed at [place] [address], [place], [place] [address]  Fibrinogen     Status: Abnormal   Collection Time: 06/21/19  1:06 AM  Result Value Ref  Range   Fibrinogen 545 (H) 210 - 475 mg/dL    Comment: Performed at [place] [address], [place], [place] [address]  CBC     Status: Abnormal   Collection Time: 06/21/19  1:10 AM  Result Value Ref Range   WBC 21.1 (H) 4.0 - 10.5 K/uL   RBC 4.20 (L) 4.22 - 5.81 MIL/uL   Hemoglobin 11.9 (L) 13.0 - 17.0 g/dL   HCT 37.5 (L) 39.0 - 52.0 %   MCV 89.3 80.0 - 100.0 fL   MCH 28.3 26.0 - 34.0 pg   MCHC 31.7 30.0 - 36.0 g/dL   RDW 13.0 11.5 - 15.5 %   Platelets 564 (H) 150 - 400 K/uL   nRBC 0.0 0.0 - 0.2 %    Comment: Performed at [facility], [place] [address] [place], [place] [address]  Lipase, blood     Status: Abnormal   Collection Time: 06/21/19  1:10 AM  Result Value Ref Range   Lipase 161 (H) 11 - 51 U/L    Comment: Performed at [facility], [place] [address], [place], [place] [address]  Amylase     Status: Abnormal   Collection Time: 06/21/19  1:10 AM  Result Value Ref Range   Amylase 354 (H) 28 - 100 U/L    Comment: Performed at [facility], [place] [address] [place], [place] [address]   Ct Abdomen Pelvis W Contrast  Result Date: 06/20/2019 CLINICAL DATA:  Abdominal pain.  Neutropenia. EXAM: CT ABDOMEN AND PELVIS WITH CONTRAST TECHNIQUE: Multidetector CT imaging of the abdomen and pelvis was performed using the standard protocol following bolus administration of intravenous contrast. CONTRAST:  127mL OMNIPAQUE IOHEXOL 300 MG/ML  SOLN COMPARISON:  None. FINDINGS: Lower chest: There is a moderate-sized left-sided pleural effusion with near complete collapse of the left lower lobe.The heart size is normal. Hepatobiliary: The liver is normal. Status post cholecystectomy.There is no biliary ductal dilation. Pancreas: There are multiple peripancreatic fluid collections the largest of which measures approximately 3.7 by 1.8 cm. The pancreas appears to enhance symmetrically. Spleen: The spleen is enlarged measuring approximately 14 cm craniocaudad. Adrenals/Urinary  Tract: --Adrenal glands: No adrenal hemorrhage. --Right kidney/ureter: No hydronephrosis or perinephric hematoma. --Left kidney/ureter: There is no left-sided hydronephrosis. There is a complex collection in the left posterior pararenal space measuring approximately 8.5 x 2.5 cm. --Urinary bladder: Unremarkable. Stomach/Bowel: --Stomach/Duodenum: There is some wall thickening of the stomach. --Small bowel: No dilatation or inflammation. --Colon: There are soft tissue densities along the descending colon measuring approximately 2.8 x 2.4 cm. There is a collection of the splenic flexure measuring 4.7 x 2.7 cm causing mass effect on the nearby:Marland Kitchen There is wall thickening of the transverse colon without evidence of an obstruction. --Appendix: Not visualized. No right lower quadrant inflammation or free fluid. Vascular/Lymphatic: Atherosclerotic changes are noted of the abdominal aorta without evidence of an abdominal aortic aneurysm. The portal vein and splenic vein remain patent. The splenic artery remains patent. --No retroperitoneal lymphadenopathy. --No mesenteric lymphadenopathy. --No pelvic or inguinal lymphadenopathy. Reproductive: Unremarkable Other: There is a small volume of free fluid in the pelvis. There are multiple scattered collections in the retroperitoneum and peritoneal  cavity. A few these collections demonstrate mild rim enhancement. For example in the left upper quadrant there is a 3.9 x 1.9 cm collection that demonstrates mild peripheral rim enhancement. There is a 3.8 by 4.4 cm collection in the region of the gallbladder fossa. Given the surgical clips in the gallbladder fossa this is favored to represent a loculated fluid collection as opposed to a remnant gallbladder. Musculoskeletal. No acute displaced fractures. IMPRESSION: 1. Overall findings concerning for pancreatitis with multiple loculated fluid collections as detailed above. Some of the smaller collections, for example in the left upper  quadrant, demonstrate rim enhancement. An abscess is not excluded. There is no CT evidence for pancreatic necrosis. 2. Moderate-sized left-sided pleural effusion with at least partial collapse of the left lower lobe. 3. Diffuse wall thickening of the transverse colon and splenic flexure favored to be reactive. Other considerations include infectious or inflammatory colitis. 4. Splenomegaly.  The splenic vein remains patent. Electronically Signed   By: Constance Holster M.D.   On: 06/20/2019 21:07   Dg Chest Portable 1 View  Result Date: 06/20/2019 CLINICAL DATA:  Chest pain EXAM: PORTABLE CHEST 1 VIEW COMPARISON:  None. FINDINGS: There is airspace consolidation in the left base with left pleural effusion. Lungs elsewhere are clear. Heart size and pulmonary vascularity are normal. No adenopathy. No bone lesions. IMPRESSION: Left lower lobe airspace consolidation consistent with pneumonia. Small left pleural effusion. Lungs elsewhere clear. No adenopathy. Heart size normal. Followup PA and lateral chest radiographs recommended in 3-4 weeks following trial of antibiotic therapy to ensure resolution and exclude underlying malignancy. Electronically Signed   By: Lowella Grip III M.D.   On: 06/20/2019 19:32    Pending Labs Unresulted Labs (From admission, onward)    Start     Ordered   06/27/19 0500  Creatinine, serum  (enoxaparin (LOVENOX)    CrCl >/= 30 ml/min)  Weekly,   R    Comments: while on enoxaparin therapy    06/20/19 2356   06/20/19 2352  Pathologist smear review  Once,   STAT    Comments: Thrombocytosis persistent with splenomegaly.    06/20/19 2356   06/20/19 2343  HIV antibody (Routine Testing)  Once,   STAT     06/20/19 2356   06/20/19 2027  Urine culture  ONCE - STAT,   STAT     06/20/19 2026   06/20/19 2025  Blood Culture (routine x 2)  BLOOD CULTURE X 2,   STAT     06/20/19 2025   06/20/19 1908  Urinalysis, Routine w reflex microscopic  (ED Abdominal Pain)  ONCE - STAT,    STAT     06/20/19 1911          Vitals/Pain Today's Vitals   06/20/19 2200 06/20/19 2300 06/20/19 2356 06/21/19 0200  BP: (!) 142/96 (!) 131/95  (!) 149/89  Pulse: (!) 120 (!) 114  (!) 107  Resp: (!) 31 (!) 22  20  Temp:      TempSrc:      SpO2: 93% 94%  94%  Weight:      Height:      PainSc:   7      Isolation Precautions No active isolations  Medications Medications  enoxaparin (LOVENOX) injection 40 mg (has no administration in time range)  dextrose 5 %-0.45 % sodium chloride infusion ( Intravenous New Bag/Given 06/21/19 0108)  acetaminophen (TYLENOL) tablet 650 mg (650 mg Oral Given 06/21/19 0052)    Or  acetaminophen (TYLENOL)  suppository 650 mg ( Rectal See Alternative 06/21/19 0052)  ondansetron (ZOFRAN) tablet 4 mg (has no administration in time range)    Or  ondansetron (ZOFRAN) injection 4 mg (has no administration in time range)  naloxone (NARCAN) injection 0.4 mg (has no administration in time range)    And  sodium chloride flush (NS) 0.9 % injection 9 mL (has no administration in time range)  diphenhydrAMINE (BENADRYL) injection 12.5 mg (has no administration in time range)    Or  diphenhydrAMINE (BENADRYL) 12.5 MG/5ML elixir 12.5 mg (has no administration in time range)  morphine 2 mg/mL PCA injection (has no administration in time range)  cefTRIAXone (ROCEPHIN) 1 g in sodium chloride 0.9 % 100 mL IVPB (has no administration in time range)  azithromycin (ZITHROMAX) 250 mg in dextrose 5 % 125 mL IVPB (has no administration in time range)  HYDROmorphone (DILAUDID) injection 1 mg (1 mg Intravenous Given 06/20/19 1920)  lactated ringers bolus 1,000 mL (0 mLs Intravenous Stopped 06/20/19 2133)  ondansetron (ZOFRAN) injection 4 mg (4 mg Intravenous Given 06/20/19 1914)  cefTRIAXone (ROCEPHIN) 1 g in sodium chloride 0.9 % 100 mL IVPB (0 g Intravenous Stopped 06/21/19 0043)  azithromycin (ZITHROMAX) 500 mg in sodium chloride 0.9 % 250 mL IVPB (0 mg Intravenous Stopped 06/21/19  0102)  acetaminophen (TYLENOL) tablet 1,000 mg (1,000 mg Oral Given 06/20/19 2100)  lactated ringers bolus 1,000 mL (0 mLs Intravenous Stopped 06/21/19 0110)  HYDROmorphone (DILAUDID) injection 1 mg (1 mg Intravenous Given 06/20/19 2055)  iohexol (OMNIPAQUE) 300 MG/ML solution 100 mL (100 mLs Intravenous Contrast Given 06/20/19 2041)    Mobility walks Low fall risk   Focused Assessments GI   R Recommendations: See Admitting Provider Note  Report given to:   Additional Notes:

## 2019-06-21 NOTE — Progress Notes (Signed)
Received pt from ED. Pt alert and oriented x4. Oriented to room and call bell.

## 2019-06-21 NOTE — Consult Note (Addendum)
Referring Provider:   [facility]        Primary Care Physician:  Patient, No Pcp Per Primary Gastroenterologist:  unassigned           Reason for Consultation:   pancreatitis                ASSESSMENT /  PLAN    1. 48 yo male with several recent admissions for recurrent, presumably idiopathic, pancreatitis with associated fluid collection / pseudocysts. He denies hx of heavy Etoh, is s/p cholecystectomy and no evidence for choledocholithiasis on imaging  No history of hypertriglyceridemia.  Followed by [facility] in [place] as well as [facility]. CT scan this admission still concerning for pancreatitis with multiple loculated fluid collections but no evidence for pancreatitic necrosis as seen on CTscan at [facility] on 06/02/19.  -Im sure he has already had an IgG4 to rule out idiopathic pancreatitis? -Supportive care for now. He has been eating at home. Hasn't tolerated tube feeds in past so will try clears and advance from there as tolerated.  -No need to repeat MRCP. At some point he may need EUS but that will be up to [facility] and / or [facility] where he has follow up appointment later this month.   2. LLL PNA, on antibiotics.   3.  Lleukocytosis, probably combination of #1 and #2. improved overnight. WBC ~30 >>> 21 on antibiotics.   HPI:     Kyle Mooney is a 48 y.o. male with several recent admissions ([facility]) for recurrent idiopathic. No history of heavy Etoh he says.  He is s/p cholecystectomy. No evidence of choledocholithiasis on imaging.  No hypertriglyceridemia. Not sure if he has had IgG4. ient  Reviewed notes in Care Everywhere.   MRI of the abdomen 05/05/2019 compatible with acute pancreatitis with a complex fluid collection with internal debris within the pancreatic body measuring 3 x 4.7 cm, likely a pseudocyst. No biliary or pancreatic duct dilation. Following that he had a CTAP with contrast on 06/02/19 compatible with acute necrotizing pancreatitis with  extensive necrotic fluid collections involving multiple abdominal spaces.  Patient admitted through ED yesterday for abdominal pain and CTAP with contrast 06/20/19 still concerning for pancreatitis with multiple fluid collections.  He also has splenomegaly and a moderate sized left sided pleural effusion with at least partial collapse of the left lower lobe.  Also what is likely reactive wall thickening of the transverse colon and splenic flexure.    Patient was just released from [facility] a week or so ago.  His abdominal pain had nearly resolved then yesterday developed recurrent pain.  This pain was a little different and that it was more diffuse, burning in nature whereas pancreatitis type pain generally located just in the upper abdomen and more of a "pushing" type pain.  No associated fever, nausea or vomiting.  Bowel movements are unchanged.  Patient has alternating constipation diarrhea, says he has a history of IBS.  No blood in stool.  PREVIOUS GASTROINTESTINAL STUDIES  none  Past Medical History:  Diagnosis Date   Ex-cigarette smoker 04/2019   GERD (gastroesophageal reflux disease)    IBS (irritable bowel syndrome)    Idiopathic pancreatitis     Past Surgical History:  Procedure Laterality Date   CHOLECYSTECTOMY      Prior to Admission medications   Not on File    Current Facility-Administered Medications  Medication Dose Route Frequency Provider Last Rate Last Dose   0.9 %  sodium chloride infusion  Intravenous Continuous Heath Lark D, DO 150 mL/hr at 06/21/19 0962     acetaminophen (TYLENOL) tablet 650 mg  650 mg Oral Q6H PRN Neena Rhymes, MD   650 mg at 06/21/19 8366   Or   acetaminophen (TYLENOL) suppository 650 mg  650 mg Rectal Q6H PRN Norins, Heinz Knuckles, MD       azithromycin (ZITHROMAX) 250 mg in dextrose 5 % 125 mL IVPB  250 mg Intravenous Q24H Norins, Heinz Knuckles, MD       diphenhydrAMINE (BENADRYL) injection 12.5 mg  12.5 mg Intravenous Q6H PRN Manuella Ghazi,  Pratik D, DO       Or   diphenhydrAMINE (BENADRYL) 12.5 MG/5ML elixir 12.5 mg  12.5 mg Oral Q6H PRN Manuella Ghazi, Pratik D, DO       enoxaparin (LOVENOX) injection 40 mg  40 mg Subcutaneous Daily Norins, Heinz Knuckles, MD   40 mg at 06/21/19 0915   ketorolac (TORADOL) 30 MG/ML injection 30 mg  30 mg Intravenous Q6H Shah, Pratik D, DO       morphine 2 mg/mL PCA injection   Intravenous Q4H Shah, Pratik D, DO       morphine 4 MG/ML injection 4 mg  4 mg Intravenous Q1H PRN Manuella Ghazi, Pratik D, DO       naloxone (NARCAN) injection 0.4 mg  0.4 mg Intravenous PRN Manuella Ghazi, Pratik D, DO       And   sodium chloride flush (NS) 0.9 % injection 9 mL  9 mL Intravenous PRN Manuella Ghazi, Pratik D, DO       ondansetron (ZOFRAN) tablet 4 mg  4 mg Oral Q6H PRN Norins, Heinz Knuckles, MD       Or   ondansetron [facility]) injection 4 mg  4 mg Intravenous Q6H PRN Norins, Heinz Knuckles, MD   4 mg at 06/21/19 0928   ondansetron (ZOFRAN) injection 4 mg  4 mg Intravenous Q6H PRN Manuella Ghazi, Pratik D, DO       pantoprazole (PROTONIX) injection 40 mg  40 mg Intravenous Daily Manuella Ghazi, Pratik D, DO   40 mg at 06/21/19 0916   piperacillin-tazobactam (ZOSYN) IVPB 3.375 g  3.375 g Intravenous Q8H Shah, Pratik D, DO 12.5 mL/hr at 06/21/19 0758 3.375 g at 06/21/19 0758    Allergies as of 06/20/2019 - Review Complete 06/20/2019  Allergen Reaction Noted   Codeine  06/20/2019   Other  06/20/2019    History reviewed. No pertinent family history.  Social History   Socioeconomic History   Marital status: Legally Separated    Spouse name: Not on file   Number of children: Not on file   Years of education: Not on file   Highest education level: Not on file  Occupational History   Not on file  Social Needs   Financial resource strain: Not on file   Food insecurity    Worry: Not on file    Inability: Not on file   Transportation needs    Medical: Not on file    Non-medical: Not on file  Tobacco Use   Smoking status: Former Smoker    Types:  Cigarettes    Quit date: 04/20/2019    Years since quitting: 0.1  Substance and Sexual Activity   Alcohol use: Never    Frequency: Never   Drug use: Never   Sexual activity: Not on file  Lifestyle   Physical activity    Days per week: Not on file    Minutes per session: Not on file  Stress: Not on file  Relationships   Social connections    Talks on phone: Not on file    Gets together: Not on file    Attends religious service: Not on file    Active member of club or organization: Not on file    Attends meetings of clubs or organizations: Not on file    Relationship status: Not on file   Intimate partner violence    Fear of current or ex partner: Not on file    Emotionally abused: Not on file    Physically abused: Not on file    Forced sexual activity: Not on file  Other Topics Concern   Not on file  Social History Narrative   Not on file    Review of Systems: All systems reviewed and negative except where noted in HPI.  Physical Exam: Vital signs in last 24 hours: Temp:  [97.5 F (36.4 C)-99.1 F (37.3 C)] 99.1 F (37.3 C) (08/02 0656) Pulse Rate:  [107-151] 119 (08/02 0656) Resp:  [18-31] 24 (08/02 0937) BP: (123-150)/(88-110) 124/97 (08/02 0656) SpO2:  [93 %-100 %] 95 % (08/02 0937) Weight:  [68 kg-75 kg] 75 kg (08/02 0302) Last BM Date: 06/20/19 General:   Alert, thin male male in NAD Psych:  Pleasant, cooperative. Normal mood and affect. Eyes:  Pupils equal, sclera clear, no icterus.   Conjunctiva pink. Ears:  Normal auditory acuity. Nose:  No deformity, discharge,  or lesions. Neck:  Supple; no masses Lungs:  Clear throughout to auscultation.   No wheezes, crackles, or rhonchi.  Heart:  Sinus tachycardia; no murmurs, no lower extremity edema Abdomen:  Soft, diffusely tender, hypoactive bowel sounds.    Rectal:  Deferred  Msk:  Symmetrical without gross deformities. . Neurologic:  Alert and  oriented x4;  grossly normal neurologically. Skin:   Intact without significant lesions or rashes.   Intake/Output from previous day: 08/01 0701 - 08/02 0700 In: 1610.6 [I.V.:260.6; IV Piggyback:1350] Out: 250 [Urine:250] Intake/Output this shift: No intake/output data recorded.  Lab Results: Recent Labs    06/20/19 1915 06/21/19 0110  WBC 29.6* 21.1*  HGB 14.6 11.9*  HCT 44.5 37.5*  PLT 754* 564*   BMET Recent Labs    06/20/19 1915 06/21/19 0106  NA 130*  --   K 4.1  --   CL 94*  --   CO2 23  --   GLUCOSE 151*  --   BUN 7  --   CREATININE 0.80 0.73  CALCIUM 9.2  --    LFT Recent Labs    06/20/19 1915  PROT 6.8  ALBUMIN 2.5*  AST 32  ALT 50*  ALKPHOS 100  BILITOT 0.9   . CBC Latest Ref Rng & Units 06/21/2019 06/20/2019  WBC 4.0 - 10.5 K/uL 21.1(H) 29.6(H)  Hemoglobin 13.0 - 17.0 g/dL 11.9(L) 14.6  Hematocrit 39.0 - 52.0 % 37.5(L) 44.5  Platelets 150 - 400 K/uL 564(H) 754(H)    . CMP Latest Ref Rng & Units 06/21/2019 06/20/2019  Glucose 70 - 99 mg/dL - 151(H)  BUN 6 - 20 mg/dL - 7  Creatinine 0.61 - 1.24 mg/dL 0.73 0.80  Sodium 135 - 145 mmol/L - 130(L)  Potassium 3.5 - 5.1 mmol/L - 4.1  Chloride 98 - 111 mmol/L - 94(L)  CO2 22 - 32 mmol/L - 23  Calcium 8.9 - 10.3 mg/dL - 9.2  Total Protein 6.5 - 8.1 g/dL - 6.8  Total Bilirubin 0.3 - 1.2 mg/dL - 0.9  Alkaline  Phos 38 - 126 U/L - 100  AST 15 - 41 U/L - 32  ALT 0 - 44 U/L - 50(H)   Studies/Results: Ct Abdomen Pelvis W Contrast  Result Date: 06/20/2019 CLINICAL DATA:  Abdominal pain.  Neutropenia. EXAM: CT ABDOMEN AND PELVIS WITH CONTRAST TECHNIQUE: Multidetector CT imaging of the abdomen and pelvis was performed using the standard protocol following bolus administration of intravenous contrast. CONTRAST:  174mL OMNIPAQUE IOHEXOL 300 MG/ML  SOLN COMPARISON:  None. FINDINGS: Lower chest: There is a moderate-sized left-sided pleural effusion with near complete collapse of the left lower lobe.The heart size is normal. Hepatobiliary: The liver is normal. Status  post cholecystectomy.There is no biliary ductal dilation. Pancreas: There are multiple peripancreatic fluid collections the largest of which measures approximately 3.7 by 1.8 cm. The pancreas appears to enhance symmetrically. Spleen: The spleen is enlarged measuring approximately 14 cm craniocaudad. Adrenals/Urinary Tract: --Adrenal glands: No adrenal hemorrhage. --Right kidney/ureter: No hydronephrosis or perinephric hematoma. --Left kidney/ureter: There is no left-sided hydronephrosis. There is a complex collection in the left posterior pararenal space measuring approximately 8.5 x 2.5 cm. --Urinary bladder: Unremarkable. Stomach/Bowel: --Stomach/Duodenum: There is some wall thickening of the stomach. --Small bowel: No dilatation or inflammation. --Colon: There are soft tissue densities along the descending colon measuring approximately 2.8 x 2.4 cm. There is a collection of the splenic flexure measuring 4.7 x 2.7 cm causing mass effect on the nearby:Marland Kitchen There is wall thickening of the transverse colon without evidence of an obstruction. --Appendix: Not visualized. No right lower quadrant inflammation or free fluid. Vascular/Lymphatic: Atherosclerotic changes are noted of the abdominal aorta without evidence of an abdominal aortic aneurysm. The portal vein and splenic vein remain patent. The splenic artery remains patent. --No retroperitoneal lymphadenopathy. --No mesenteric lymphadenopathy. --No pelvic or inguinal lymphadenopathy. Reproductive: Unremarkable Other: There is a small volume of free fluid in the pelvis. There are multiple scattered collections in the retroperitoneum and peritoneal cavity. A few these collections demonstrate mild rim enhancement. For example in the left upper quadrant there is a 3.9 x 1.9 cm collection that demonstrates mild peripheral rim enhancement. There is a 3.8 by 4.4 cm collection in the region of the gallbladder fossa. Given the surgical clips in the gallbladder fossa this is  favored to represent a loculated fluid collection as opposed to a remnant gallbladder. Musculoskeletal. No acute displaced fractures. IMPRESSION: 1. Overall findings concerning for pancreatitis with multiple loculated fluid collections as detailed above. Some of the smaller collections, for example in the left upper quadrant, demonstrate rim enhancement. An abscess is not excluded. There is no CT evidence for pancreatic necrosis. 2. Moderate-sized left-sided pleural effusion with at least partial collapse of the left lower lobe. 3. Diffuse wall thickening of the transverse colon and splenic flexure favored to be reactive. Other considerations include infectious or inflammatory colitis. 4. Splenomegaly.  The splenic vein remains patent. Electronically Signed   By: Constance Holster M.D.   On: 06/20/2019 21:07   Dg Chest Portable 1 View  Result Date: 06/20/2019 CLINICAL DATA:  Chest pain EXAM: PORTABLE CHEST 1 VIEW COMPARISON:  None. FINDINGS: There is airspace consolidation in the left base with left pleural effusion. Lungs elsewhere are clear. Heart size and pulmonary vascularity are normal. No adenopathy. No bone lesions. IMPRESSION: Left lower lobe airspace consolidation consistent with pneumonia. Small left pleural effusion. Lungs elsewhere clear. No adenopathy. Heart size normal. Followup PA and lateral chest radiographs recommended in 3-4 weeks following trial of antibiotic therapy to ensure resolution  and exclude underlying malignancy. Electronically Signed   By: Lowella Grip III M.D.   On: 06/20/2019 19:32    Active Problems:   Chronic pancreatitis (HCC)   Thrombocytosis (HCC)   LLL pneumonia (HCC)   Recurrent left pleural effusion   Pancreatitis, recurrent    Tye Savoy, NP-C @  06/21/2019, 9:57 AM

## 2019-06-22 ENCOUNTER — Encounter (HOSPITAL_COMMUNITY): Payer: Self-pay | Admitting: General Practice

## 2019-06-22 DIAGNOSIS — K861 Other chronic pancreatitis: Secondary | ICD-10-CM

## 2019-06-22 DIAGNOSIS — D473 Essential (hemorrhagic) thrombocythemia: Secondary | ICD-10-CM

## 2019-06-22 DIAGNOSIS — K5900 Constipation, unspecified: Secondary | ICD-10-CM

## 2019-06-22 DIAGNOSIS — K859 Acute pancreatitis without necrosis or infection, unspecified: Secondary | ICD-10-CM

## 2019-06-22 LAB — URINE CULTURE: Culture: NO GROWTH

## 2019-06-22 LAB — COMPREHENSIVE METABOLIC PANEL
ALT: 25 U/L (ref 0–44)
AST: 14 U/L — ABNORMAL LOW (ref 15–41)
Albumin: 1.7 g/dL — ABNORMAL LOW (ref 3.5–5.0)
Alkaline Phosphatase: 87 U/L (ref 38–126)
Anion gap: 6 (ref 5–15)
BUN: 9 mg/dL (ref 6–20)
CO2: 25 mmol/L (ref 22–32)
Calcium: 8.2 mg/dL — ABNORMAL LOW (ref 8.9–10.3)
Chloride: 100 mmol/L (ref 98–111)
Creatinine, Ser: 0.71 mg/dL (ref 0.61–1.24)
GFR calc Af Amer: >60 mL/min (ref >60)
GFR calc non Af Amer: >60 mL/min (ref >60)
Glucose, Bld: 105 mg/dL — ABNORMAL HIGH (ref 70–99)
Potassium: 3.9 mmol/L (ref 3.5–5.1)
Sodium: 131 mmol/L — ABNORMAL LOW (ref 135–145)
Total Bilirubin: 0.5 mg/dL (ref 0.3–1.2)
Total Protein: 4.8 g/dL — ABNORMAL LOW (ref 6.5–8.1)

## 2019-06-22 LAB — CBC
HCT: 29.8 % — ABNORMAL LOW (ref 39.0–52.0)
Hemoglobin: 9.4 g/dL — ABNORMAL LOW (ref 13.0–17.0)
MCH: 28.1 pg (ref 26.0–34.0)
MCHC: 31.5 g/dL (ref 30.0–36.0)
MCV: 89 fL (ref 80.0–100.0)
Platelets: 439 10*3/uL — ABNORMAL HIGH (ref 150–400)
RBC: 3.35 MIL/uL — ABNORMAL LOW (ref 4.22–5.81)
RDW: 13.1 % (ref 11.5–15.5)
WBC: 16.3 10*3/uL — ABNORMAL HIGH (ref 4.0–10.5)
nRBC: 0 % (ref 0.0–0.2)

## 2019-06-22 LAB — LIPASE, BLOOD: Lipase: 129 U/L — ABNORMAL HIGH (ref 11–51)

## 2019-06-22 LAB — MAGNESIUM: Magnesium: 1.4 mg/dL — ABNORMAL LOW (ref 1.7–2.4)

## 2019-06-22 MED ORDER — POLYETHYLENE GLYCOL 3350 17 G PO PACK
17.0000 g | PACK | Freq: Every day | ORAL | Status: DC
  Administered 2019-06-22: 17 g via ORAL
  Filled 2019-06-22: qty 1

## 2019-06-22 MED ORDER — KETOROLAC TROMETHAMINE 30 MG/ML IJ SOLN
30.0000 mg | Freq: Three times a day (TID) | INTRAMUSCULAR | Status: DC | PRN
  Administered 2019-06-23 (×2): 30 mg via INTRAVENOUS
  Filled 2019-06-22 (×2): qty 1

## 2019-06-22 MED ORDER — POLYETHYLENE GLYCOL 3350 17 G PO PACK
17.0000 g | PACK | Freq: Two times a day (BID) | ORAL | Status: DC
  Administered 2019-06-22 – 2019-06-23 (×3): 17 g via ORAL
  Filled 2019-06-22 (×7): qty 1

## 2019-06-22 MED ORDER — METOPROLOL TARTRATE 5 MG/5ML IV SOLN
5.0000 mg | Freq: Four times a day (QID) | INTRAVENOUS | Status: DC | PRN
  Administered 2019-06-22 – 2019-06-25 (×3): 5 mg via INTRAVENOUS
  Filled 2019-06-22 (×3): qty 5

## 2019-06-22 MED ORDER — BACLOFEN 10 MG PO TABS
10.0000 mg | ORAL_TABLET | Freq: Three times a day (TID) | ORAL | Status: DC
  Administered 2019-06-22 – 2019-06-28 (×17): 10 mg via ORAL
  Filled 2019-06-22 (×17): qty 1

## 2019-06-22 MED ORDER — MAGNESIUM SULFATE 2 GM/50ML IV SOLN
2.0000 g | Freq: Once | INTRAVENOUS | Status: AC
  Administered 2019-06-22: 2 g via INTRAVENOUS
  Filled 2019-06-22: qty 50

## 2019-06-22 MED ORDER — BOOST / RESOURCE BREEZE PO LIQD CUSTOM
1.0000 | Freq: Three times a day (TID) | ORAL | Status: DC
  Administered 2019-06-22 – 2019-06-26 (×9): 1 via ORAL

## 2019-06-22 MED ORDER — PANTOPRAZOLE SODIUM 40 MG PO TBEC
40.0000 mg | DELAYED_RELEASE_TABLET | Freq: Every day | ORAL | Status: DC
  Administered 2019-06-23 – 2019-06-28 (×6): 40 mg via ORAL
  Filled 2019-06-22 (×6): qty 1

## 2019-06-22 NOTE — Progress Notes (Signed)
PROGRESS NOTE    Kyle Mooney  UJW:[id number] DOB: November 23, 1970 DOA: 06/20/2019 PCP: Patient, No Pcp Per   Brief Narrative:  Per HPI: Kyle Mooney a 47 y.o.malewith medical history significant ofrecurrent pancreatitis. He has had 5 prior hospitalizations since May 2020. Most recent hospitalization was at [facility] 7/14 to 06/11/2019 for a diagnosis of idiopathic necrotizing pancreatitis. The patient was seen by GI and surgical services. He had MR CP which confirmed his necrotizing pancreatitis and also revealed multiple fluid collections in the peripancreatic region and throughout the abdomen. He was not felt to be a candidate for a bile duct stent. Patient during his hospitalization had a thrombocytosis which was thought to be reactive. He had a leukocytosis also thought to be reactive. Since being home the patient has continued to have abdominal discomfort. He has had poor p.o. intake. The day of admission he had a sudden increase in pain in the left flank and left lower back. His abdominal pain also became more intense. He was discharged from [facility] with a prescription for oxycodone and he did take this today. Patient called EMS and asked to be taken to [facility] but he was taken instead to [facility]. He reports that his pain is severe at a 10/10 particularly in the left upper quadrant of the abdomen. Does admit to having had a cough productive of a grayish-greenish sputum.He denies any frank shortness of breath.   Patient was admitted for recurrent acute on chronic pancreatitis with recent hospitalization at [facility] where he was noted to have necrotizing pancreatitis.  He has also had recent MRCP and placement of a bile duct stent was discussed at his last hospitalization, but was not performed.   8/3: Patient has improvement in his pain control and is currently tolerating clear liquid diet.  No other acute overnight events noted.  He is continued on  azithromycin and Zosyn for now.  Appreciate GI involvement and further evaluation.  Assessment & Plan:   Active Problems:   Chronic pancreatitis (HCC)   Thrombocytosis (HCC)   LLL pneumonia (HCC)   Recurrent left pleural effusion   Pancreatitis, recurrent   Recurrent abdominal pain secondary to idiopathic, persistent pancreatitis with pseudocysts-improved -Continue current IV fluid hydration -Begin to wean pain management and Toradol has been changed to as needed -Begin to wean off PCA pump as tolerated -Advance diet per GI -Will require further follow-up at [facility] with GI team -Possible MRCP soon -Continue IV Zosyn for now  Leukocytosis likely secondary to pancreatitis-improving -Continue to trend CBC  Left lower lobe fluid collection-moderate size -No significant shortness of breath or respiratory distress currently noted -Hold off on thoracentesis at this time given acute pancreatitis; would consider this once pancreatitis has resolved in outpatient setting -Continue on azithromycin and Zosyn for empiric coverage of possible pneumonia, but this is unlikely  Thrombocytosis-improving -Partially could be related to hemoconcentration as trend is downwards with IV fluid -Patient is noted to have some splenomegaly and will likely require further evaluation from hematologist in near future -Continue to trend with CBC   DVT prophylaxis: Lovenox Code Status: Full Family Communication: None at bedside Disposition Plan: Continue ongoing treatment for acute pancreatitis which appears to be slowly improving.  Anticipate discharge in the next 24 to 48 hours pending further GI evaluation and diet advancement.   Consultants:   GI  Procedures:   None  Antimicrobials:  Anti-infectives (From admission, onward)   Start     Dose/Rate Route Frequency Ordered Stop  06/21/19 2000  cefTRIAXone (ROCEPHIN) 1 g in sodium chloride 0.9 % 100 mL IVPB  Status:  Discontinued     1 g 200  mL/hr over 30 Minutes Intravenous Every 24 hours 06/21/19 0026 06/21/19 0710   06/21/19 2000  azithromycin (ZITHROMAX) 250 mg in dextrose 5 % 125 mL IVPB     250 mg 125 mL/hr over 60 Minutes Intravenous Every 24 hours 06/21/19 0026 06/25/19 1959   06/21/19 0800  piperacillin-tazobactam (ZOSYN) IVPB 3.375 g     3.375 g 12.5 mL/hr over 240 Minutes Intravenous Every 8 hours 06/21/19 0718     06/20/19 2015  cefTRIAXone (ROCEPHIN) 1 g in sodium chloride 0.9 % 100 mL IVPB     1 g 200 mL/hr over 30 Minutes Intravenous  Once 06/20/19 2008 06/21/19 0043   06/20/19 2015  azithromycin (ZITHROMAX) 500 mg in sodium chloride 0.9 % 250 mL IVPB     500 mg 250 mL/hr over 60 Minutes Intravenous  Once 06/20/19 2008 06/21/19 0102       Subjective: Patient seen and evaluated today with no new acute complaints or concerns. No acute concerns or events noted overnight.  He is able to tolerate his clear liquid diet and denies further abdominal pain this morning.  No nausea or vomiting noted.  Objective: Vitals:   06/21/19 2143 06/21/19 2318 06/22/19 0507 06/22/19 0510  BP: 96/63  100/64   Pulse: 89  90   Resp: 17 19 17 14   Temp: 98.2 F (36.8 C)  98 F (36.7 C)   TempSrc: Oral  Oral   SpO2: 100% 96% 100% 96%  Weight:      Height:        Intake/Output Summary (Last 24 hours) at 06/22/2019 0710 Last data filed at 06/21/2019 1837 Gross per 24 hour  Intake 1079.54 ml  Output 200 ml  Net 879.54 ml   Filed Weights   06/20/19 1910 06/21/19 0302  Weight: 68 kg 75 kg    Examination:  General exam: Appears calm and comfortable  Respiratory system: Clear to auscultation. Respiratory effort normal. Cardiovascular system: S1 & S2 heard, RRR. No JVD, murmurs, rubs, gallops or clicks. No pedal edema. Gastrointestinal system: Abdomen is nondistended, soft and nontender. No organomegaly or masses felt. Normal bowel sounds heard. Central nervous system: Alert and oriented. No focal neurological  deficits. Extremities: Symmetric 5 x 5 power. Skin: No rashes, lesions or ulcers Psychiatry: Judgement and insight appear normal. Mood & affect appropriate.     Data Reviewed: I have personally reviewed following labs and imaging studies  CBC: Recent Labs  Lab 06/20/19 1915 06/21/19 0110 06/22/19 0200  WBC 29.6* 21.1* 16.3*  NEUTROABS 25.2*  --   --   HGB 14.6 11.9* 9.4*  HCT 44.5 37.5* 29.8*  MCV 87.1 89.3 89.0  PLT 754* 564* 439*   Basic Metabolic Panel: Recent Labs  Lab 06/20/19 1915 06/21/19 0106 06/22/19 0200  NA 130*  --  131*  K 4.1  --  3.9  CL 94*  --  100  CO2 23  --  25  GLUCOSE 151*  --  105*  BUN 7  --  9  CREATININE 0.80 0.73 0.71  CALCIUM 9.2  --  8.2*  MG  --   --  1.4*   GFR: Estimated Creatinine Clearance: 121.1 mL/min (by C-G formula based on SCr of 0.71 mg/dL). Liver Function Tests: Recent Labs  Lab 06/20/19 1915 06/22/19 0200  AST 32 14*  ALT 50* 25  ALKPHOS 100 87  BILITOT 0.9 0.5  PROT 6.8 4.8*  ALBUMIN 2.5* 1.7*   Recent Labs  Lab 06/20/19 1915 06/21/19 0110 06/22/19 0200  LIPASE 181* 161* 129*  AMYLASE  --  354*  --    No results for input(s): AMMONIA in the last 168 hours. Coagulation Profile: No results for input(s): INR, PROTIME in the last 168 hours. Cardiac Enzymes: No results for input(s): CKTOTAL, CKMB, CKMBINDEX, TROPONINI in the last 168 hours. BNP (last 3 results) No results for input(s): PROBNP in the last 8760 hours. HbA1C: No results for input(s): HGBA1C in the last 72 hours. CBG: No results for input(s): GLUCAP in the last 168 hours. Lipid Profile: No results for input(s): CHOL, HDL, LDLCALC, TRIG, CHOLHDL, LDLDIRECT in the last 72 hours. Thyroid Function Tests: No results for input(s): TSH, T4TOTAL, FREET4, T3FREE, THYROIDAB in the last 72 hours. Anemia Panel: Recent Labs    06/21/19 0106  FERRITIN 553*   Sepsis Labs: Recent Labs  Lab 06/20/19 1910 06/21/19 0106  LATICACIDVEN 1.8 1.3     Recent Results (from the past 240 hour(s))  Blood Culture (routine x 2)     Status: None (Preliminary result)   Collection Time: 06/20/19  9:20 PM   Specimen: BLOOD RIGHT WRIST  Result Value Ref Range Status   Specimen Description BLOOD RIGHT WRIST  Final   Special Requests   Final    BOTTLES DRAWN AEROBIC AND ANAEROBIC Blood Culture results may not be optimal due to an inadequate volume of blood received in culture bottles   Culture   Final    NO GROWTH < 12 HOURS Performed at [facility], [address], [place], [place] [address]    Report Status PENDING  Incomplete  Blood Culture (routine x 2)     Status: None (Preliminary result)   Collection Time: 06/20/19  9:30 PM   Specimen: BLOOD RIGHT FOREARM  Result Value Ref Range Status   Specimen Description BLOOD RIGHT FOREARM  Final   Special Requests   Final    BOTTLES DRAWN AEROBIC AND ANAEROBIC Blood Culture adequate volume   Culture   Final    NO GROWTH < 12 HOURS Performed at [facility], [address], [place], [place] [address]    Report Status PENDING  Incomplete  SARS Coronavirus 2 [facility] order, Performed in [facility] hospital lab) Nasopharyngeal Nasopharyngeal Swab     Status: None   Collection Time: 06/20/19  9:33 PM   Specimen: Nasopharyngeal Swab  Result Value Ref Range Status   SARS Coronavirus 2 NEGATIVE NEGATIVE Final    Comment: (NOTE) If result is NEGATIVE SARS-CoV-2 target nucleic acids are NOT DETECTED. The SARS-CoV-2 RNA is generally detectable in upper and lower  respiratory specimens during the acute phase of infection. The lowest  concentration of SARS-CoV-2 viral copies this assay can detect is 250  copies / mL. A negative result does not preclude SARS-CoV-2 infection  and should not be used as the sole basis for treatment or other  patient management decisions.  A negative result may occur with  improper specimen collection / handling, submission of specimen other  than  nasopharyngeal swab, presence of viral mutation(s) within the  areas targeted by this assay, and inadequate number of viral copies  (<250 copies / mL). A negative result must be combined with clinical  observations, patient history, and epidemiological information. If result is POSITIVE SARS-CoV-2 target nucleic acids are DETECTED. The SARS-CoV-2 RNA is generally  detectable in upper and lower  respiratory specimens dur ing the acute phase of infection.  Positive  results are indicative of active infection with SARS-CoV-2.  Clinical  correlation with patient history and other diagnostic information is  necessary to determine patient infection status.  Positive results do  not rule out bacterial infection or co-infection with other viruses. If result is PRESUMPTIVE POSTIVE SARS-CoV-2 nucleic acids MAY BE PRESENT.   A presumptive positive result was obtained on the submitted specimen  and confirmed on repeat testing.  While 2019 novel coronavirus  (SARS-CoV-2) nucleic acids may be present in the submitted sample  additional confirmatory testing may be necessary for epidemiological  and / or clinical management purposes  to differentiate between  SARS-CoV-2 and other Sarbecovirus currently known to infect humans.  If clinically indicated additional testing with an alternate test  methodology [phone]) is advised. The SARS-CoV-2 RNA is generally  detectable in upper and lower respiratory sp ecimens during the acute  phase of infection. The expected result is Negative. Fact Sheet for Patients:  BoilerBrush.com.cy Fact Sheet for Healthcare Providers: https://pope.com/ This test is not yet approved or cleared by the [place] FDA and has been authorized for detection and/or diagnosis of SARS-CoV-2 by FDA under an Emergency Use Authorization (EUA).  This EUA will remain in effect (meaning this test can be used) for the duration of  the COVID-19 declaration under Section 564(b)(1) of the Act, 21 U.S.C. section 360bbb-3(b)(1), unless the authorization is terminated or revoked sooner. Performed at [facility], [address], [place], [place] [address]          Radiology Studies: Dg Abd 1 View  Result Date: 06/21/2019 CLINICAL DATA:  Pt reports extreme abdominal pain across entire abdomen. Reports hx of pancreatitis. Pain with any movements. EXAM: ABDOMEN - 1 VIEW COMPARISON:  None. FINDINGS: The bowel gas pattern is normal. Cholecystectomy clips. No radio-opaque calculi or other significant radiographic abnormality are seen. IMPRESSION: Negative. Electronically Signed   By: Corlis Leak M.D.   On: 06/21/2019 11:04   Ct Abdomen Pelvis W Contrast  Result Date: 06/20/2019 CLINICAL DATA:  Abdominal pain.  Neutropenia. EXAM: CT ABDOMEN AND PELVIS WITH CONTRAST TECHNIQUE: Multidetector CT imaging of the abdomen and pelvis was performed using the standard protocol following bolus administration of intravenous contrast. CONTRAST:  OMNIPAQUE IOHEXOL 300 MG/ML  SOLN COMPARISON:  None. FINDINGS: Lower chest: There is a moderate-sized left-sided pleural effusion with near complete collapse of the left lower lobe.The heart size is normal. Hepatobiliary: The liver is normal. Status post cholecystectomy.There is no biliary ductal dilation. Pancreas: There are multiple peripancreatic fluid collections the largest of which measures approximately 3.7 by 1.8 cm. The pancreas appears to enhance symmetrically. Spleen: The spleen is enlarged measuring approximately 14 cm craniocaudad. Adrenals/Urinary Tract: --Adrenal glands: No adrenal hemorrhage. --Right kidney/ureter: No hydronephrosis or perinephric hematoma. --Left kidney/ureter: There is no left-sided hydronephrosis. There is a complex collection in the left posterior pararenal space measuring approximately 8.5 x 2.5 cm. --Urinary bladder: Unremarkable. Stomach/Bowel:  --Stomach/Duodenum: There is some wall thickening of the stomach. --Small bowel: No dilatation or inflammation. --Colon: There are soft tissue densities along the descending colon measuring approximately 2.8 x 2.4 cm. There is a collection of the splenic flexure measuring 4.7 x 2.7 cm causing mass effect on the nearby:Marland Kitchen There is wall thickening of the transverse colon without evidence of an obstruction. --Appendix: Not visualized. No right lower quadrant inflammation or free fluid. Vascular/Lymphatic: Atherosclerotic changes are  noted of the abdominal aorta without evidence of an abdominal aortic aneurysm. The portal vein and splenic vein remain patent. The splenic artery remains patent. --No retroperitoneal lymphadenopathy. --No mesenteric lymphadenopathy. --No pelvic or inguinal lymphadenopathy. Reproductive: Unremarkable Other: There is a small volume of free fluid in the pelvis. There are multiple scattered collections in the retroperitoneum and peritoneal cavity. A few these collections demonstrate mild rim enhancement. For example in the left upper quadrant there is a 3.9 x 1.9 cm collection that demonstrates mild peripheral rim enhancement. There is a 3.8 by 4.4 cm collection in the region of the gallbladder fossa. Given the surgical clips in the gallbladder fossa this is favored to represent a loculated fluid collection as opposed to a remnant gallbladder. Musculoskeletal. No acute displaced fractures. IMPRESSION: 1. Overall findings concerning for pancreatitis with multiple loculated fluid collections as detailed above. Some of the smaller collections, for example in the left upper quadrant, demonstrate rim enhancement. An abscess is not excluded. There is no CT evidence for pancreatic necrosis. 2. Moderate-sized left-sided pleural effusion with at least partial collapse of the left lower lobe. 3. Diffuse wall thickening of the transverse colon and splenic flexure favored to be reactive. Other  considerations include infectious or inflammatory colitis. 4. Splenomegaly.  The splenic vein remains patent. Electronically Signed   By: Katherine Mantle M.D.   On: 06/20/2019 21:07   Dg Chest Portable 1 View  Result Date: 06/20/2019 CLINICAL DATA:  Chest pain EXAM: PORTABLE CHEST 1 VIEW COMPARISON:  None. FINDINGS: There is airspace consolidation in the left base with left pleural effusion. Lungs elsewhere are clear. Heart size and pulmonary vascularity are normal. No adenopathy. No bone lesions. IMPRESSION: Left lower lobe airspace consolidation consistent with pneumonia. Small left pleural effusion. Lungs elsewhere clear. No adenopathy. Heart size normal. Followup PA and lateral chest radiographs recommended in 3-4 weeks following trial of antibiotic therapy to ensure resolution and exclude underlying malignancy. Electronically Signed   By: Bretta Bang III M.D.   On: 06/20/2019 19:32        Scheduled Meds:  baclofen  5 mg Oral TID   enoxaparin (LOVENOX) injection  40 mg Subcutaneous Daily   feeding supplement (ENSURE ENLIVE)  237 mL Oral BID BM   morphine   Intravenous Q4H   pantoprazole (PROTONIX) IV  40 mg Intravenous Daily   Continuous Infusions:  sodium chloride 150 mL/hr at 06/21/19 1751   azithromycin 250 mg (06/21/19 2006)   magnesium sulfate bolus IVPB     piperacillin-tazobactam (ZOSYN)  IV 3.375 g (06/21/19 2318)     LOS: 2 days    Time spent: 30 minutes    Aishi Courts Hoover Brunette, DO [facility] Pager [phone]  If 7PM-7AM, please contact night-coverage [email or web address] Password [email or web address] 06/22/2019, 7:10 AM

## 2019-06-22 NOTE — TOC Initial Note (Signed)
Transition of Care [facility]) - Initial/Assessment Note    Patient Details  Name: Kyle Mooney MRN: [id number] Date of Birth: [birth year]  [facility] CM/SW Contact:    Marilu Favre, RN Phone Number: 06/22/2019, 1:32 PM  Clinical Narrative:                  Patient from home, confirmed face sheet information. Patient and his wife are "on and off, she comes and goes". Patient stated wife will help him at discharge. He is establishing care with Dr Wylene Men at [place] continue to follow , for assistance with discharge prescriptions. Patient aware NCM cannot assist with any pain medication. Patient would like to use TOC pharmacy at discharge. Expected Discharge Plan: Home/Self Care Barriers to Discharge: Continued Medical Work up   Patient Goals and CMS Choice Patient states their goals for this hospitalization and ongoing recovery are:: to go home CMS Medicare.gov Compare Post Acute Care list provided to:: Patient Choice offered to / list presented to : NA  Expected Discharge Plan and Services Expected Discharge Plan: Home/Self Care   Discharge Planning Services: CM Consult, [place] [facility], Medication Assistance   Living arrangements for the past 2 months: Single Family Home                 DME Arranged: N/A         HH Arranged: NA          Prior Living Arrangements/Services Living arrangements for the past 2 months: Single Family Home Lives with:: Spouse(on and off) Patient language and need for interpreter reviewed:: Yes Do you feel safe going back to the place where you live?: Yes      Need for Family Participation in Patient Care: No (Comment) Care giver support system in place?: Yes (comment)   Criminal Activity/Legal Involvement Pertinent to Current Situation/Hospitalization: No - Comment as needed  Activities of Daily Living Home Assistive Devices/Equipment: Eyeglasses ADL Screening (condition at time of admission) Patient's cognitive  ability adequate to safely complete daily activities?: Yes Is the patient deaf or have difficulty hearing?: No Does the patient have difficulty seeing, even when wearing glasses/contacts?: No Does the patient have difficulty concentrating, remembering, or making decisions?: No Patient able to express need for assistance with ADLs?: Yes Does the patient have difficulty dressing or bathing?: No Independently performs ADLs?: Yes (appropriate for developmental age) Does the patient have difficulty walking or climbing stairs?: No Weakness of Legs: None Weakness of Arms/Hands: None  Permission Sought/Granted                  Emotional Assessment Appearance:: Appears stated age Attitude/Demeanor/Rapport: Engaged Affect (typically observed): Accepting Orientation: : Oriented to Self, Oriented to Place, Oriented to  Time, Oriented to Situation Alcohol / Substance Use: Not Applicable Psych Involvement: No (comment)  Admission diagnosis:  Pleural effusion associated with pancreatitis [K85.90, J91.8] Acute on chronic pancreatitis ([place]) [K85.90, K86.1] Patient Active Problem List   Diagnosis Date Noted  . Chronic pancreatitis ([place]) 06/20/2019  . Thrombocytosis ([place]) 06/20/2019  . LLL pneumonia ([place]) 06/20/2019  . Recurrent left pleural effusion 06/20/2019  . GERD (gastroesophageal reflux disease) 06/20/2019  . IBS (irritable bowel syndrome) 06/20/2019  . Pancreatitis, recurrent 06/20/2019   PCP:  Patient, No Pcp Per Pharmacy:   [facility] [address], [place] [address] Phone: [phone] Fax: [phone]     Social Determinants of Health (SDOH) Interventions  Readmission Risk Interventions No flowsheet data found.

## 2019-06-22 NOTE — Plan of Care (Signed)
?  Problem: Elimination: ?Goal: Will not experience complications related to urinary retention ?Outcome: Progressing ?  ?

## 2019-06-22 NOTE — Progress Notes (Signed)
Daily Rounding Note  06/22/2019, 12:45 PM  LOS: 2 days   SUBJECTIVE:   Chief complaint: Recurrent pancreatitis. Abdominal pain much improved today the help of PCA.  Now just feels upper abdominal burning.  Tolerating clears and would love to have something more substantial to eat.  No nausea.  OBJECTIVE:         Vital signs in last 24 hours:    Temp:  [97.7 F (36.5 C)-98.2 F (36.8 C)] 98 F (36.7 C) (08/03 0507) Pulse Rate:  [89-94] 90 (08/03 0507) Resp:  [12-20] 12 (08/03 1225) BP: (96-115)/(63-86) 100/64 (08/03 0507) SpO2:  [95 %-100 %] 95 % (08/03 1225) Last BM Date: 06/21/19 Filed Weights   06/20/19 1910 06/21/19 0302  Weight: 68 kg 75 kg   General: Pleasant, comfortable, does not look ill. Heart: RRR. Chest: Clear bilaterally.  No labored breathing or cough. Abdomen: Soft.  Minimal tenderness across the upper abdomen.  No guarding or rebound. Extremities: No CCE. Neuro/Psych: Oriented x3.  Calm, pleasant.  No tremor or gross deficits.  Intake/Output from previous day: 08/02 0701 - 08/03 0700 In: 1079.5 [I.V.:1015; IV Piggyback:64.5] Out: 200 [Urine:200]  Intake/Output this shift: Total I/O In: 800 [P.O.:800] Out: -   Lab Results: Recent Labs    06/20/19 1915 06/21/19 0110 06/22/19 0200  WBC 29.6* 21.1* 16.3*  HGB 14.6 11.9* 9.4*  HCT 44.5 37.5* 29.8*  PLT 754* 564* 439*   BMET Recent Labs    06/20/19 1915 06/21/19 0106 06/22/19 0200  NA 130*  --  131*  K 4.1  --  3.9  CL 94*  --  100  CO2 23  --  25  GLUCOSE 151*  --  105*  BUN 7  --  9  CREATININE 0.80 0.73 0.71  CALCIUM 9.2  --  8.2*   LFT Recent Labs    06/20/19 1915 06/22/19 0200  PROT 6.8 4.8*  ALBUMIN 2.5* 1.7*  AST 32 14*  ALT 50* 25  ALKPHOS 100 87  BILITOT 0.9 0.5   PT/INR No results for input(s): LABPROT, INR in the last 72 hours. Hepatitis Panel No results for input(s): HEPBSAG, HCVAB, HEPAIGM, HEPBIGM in  the last 72 hours.  Studies/Results: Dg Abd 1 View  Result Date: 06/21/2019 CLINICAL DATA:  Pt reports extreme abdominal pain across entire abdomen. Reports hx of pancreatitis. Pain with any movements. EXAM: ABDOMEN - 1 VIEW COMPARISON:  None. FINDINGS: The bowel gas pattern is normal. Cholecystectomy clips. No radio-opaque calculi or other significant radiographic abnormality are seen. IMPRESSION: Negative. Electronically Signed   By: Lucrezia Europe M.D.   On: 06/21/2019 11:04   Ct Abdomen Pelvis W Contrast  Result Date: 06/20/2019 CLINICAL DATA:  Abdominal pain.  Neutropenia. EXAM: CT ABDOMEN AND PELVIS WITH CONTRAST TECHNIQUE: Multidetector CT imaging of the abdomen and pelvis was performed using the standard protocol following bolus administration of intravenous contrast. CONTRAST:  13mL OMNIPAQUE IOHEXOL 300 MG/ML  SOLN COMPARISON:  None. FINDINGS: Lower chest: There is a moderate-sized left-sided pleural effusion with near complete collapse of the left lower lobe.The heart size is normal. Hepatobiliary: The liver is normal. Status post cholecystectomy.There is no biliary ductal dilation. Pancreas: There are multiple peripancreatic fluid collections the largest of which measures approximately 3.7 by 1.8 cm. The pancreas appears to enhance symmetrically. Spleen: The spleen is enlarged measuring approximately 14 cm craniocaudad. Adrenals/Urinary Tract: --Adrenal glands: No adrenal hemorrhage. --Right kidney/ureter: No hydronephrosis or perinephric hematoma. --Left  kidney/ureter: There is no left-sided hydronephrosis. There is a complex collection in the left posterior pararenal space measuring approximately 8.5 x 2.5 cm. --Urinary bladder: Unremarkable. Stomach/Bowel: --Stomach/Duodenum: There is some wall thickening of the stomach. --Small bowel: No dilatation or inflammation. --Colon: There are soft tissue densities along the descending colon measuring approximately 2.8 x 2.4 cm. There is a collection of  the splenic flexure measuring 4.7 x 2.7 cm causing mass effect on the nearby:Marland Kitchen There is wall thickening of the transverse colon without evidence of an obstruction. --Appendix: Not visualized. No right lower quadrant inflammation or free fluid. Vascular/Lymphatic: Atherosclerotic changes are noted of the abdominal aorta without evidence of an abdominal aortic aneurysm. The portal vein and splenic vein remain patent. The splenic artery remains patent. --No retroperitoneal lymphadenopathy. --No mesenteric lymphadenopathy. --No pelvic or inguinal lymphadenopathy. Reproductive: Unremarkable Other: There is a small volume of free fluid in the pelvis. There are multiple scattered collections in the retroperitoneum and peritoneal cavity. A few these collections demonstrate mild rim enhancement. For example in the left upper quadrant there is a 3.9 x 1.9 cm collection that demonstrates mild peripheral rim enhancement. There is a 3.8 by 4.4 cm collection in the region of the gallbladder fossa. Given the surgical clips in the gallbladder fossa this is favored to represent a loculated fluid collection as opposed to a remnant gallbladder. Musculoskeletal. No acute displaced fractures. IMPRESSION: 1. Overall findings concerning for pancreatitis with multiple loculated fluid collections as detailed above. Some of the smaller collections, for example in the left upper quadrant, demonstrate rim enhancement. An abscess is not excluded. There is no CT evidence for pancreatic necrosis. 2. Moderate-sized left-sided pleural effusion with at least partial collapse of the left lower lobe. 3. Diffuse wall thickening of the transverse colon and splenic flexure favored to be reactive. Other considerations include infectious or inflammatory colitis. 4. Splenomegaly.  The splenic vein remains patent. Electronically Signed   By: Constance Holster M.D.   On: 06/20/2019 21:07   Dg Chest Portable 1 View  Result Date: 06/20/2019 CLINICAL DATA:   Chest pain EXAM: PORTABLE CHEST 1 VIEW COMPARISON:  None. FINDINGS: There is airspace consolidation in the left base with left pleural effusion. Lungs elsewhere are clear. Heart size and pulmonary vascularity are normal. No adenopathy. No bone lesions. IMPRESSION: Left lower lobe airspace consolidation consistent with pneumonia. Small left pleural effusion. Lungs elsewhere clear. No adenopathy. Heart size normal. Followup PA and lateral chest radiographs recommended in 3-4 weeks following trial of antibiotic therapy to ensure resolution and exclude underlying malignancy. Electronically Signed   By: Lowella Grip III M.D.   On: 06/20/2019 19:32    ASSESMENT:   *     Recurrent, idiopathic pancreatitis.  Pseudocysts, complex fluid collections on MRCP of 05/05/19.  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   Currently admission # 4 with pancreatitis.  Discharged from [facility] hospital about 10 days ago.  Into the [facility] ED with recurrent abdominal pain. CTAP 06/20/19: Pancreatitis, multiple fluid collections, splenomegaly, left pleural effusion and partial left lower lobe collapse.  Reactive colon wall thickening of transverse and splenic flexure. Previous Coley cystectomy, no history of choledocholithiasis.  Followed by digestive health in [place] and [facility]  *    LLL PNA.      PLAN   *  Advance to low fat diet.  Add Miralax.      Azucena Freed  06/22/2019, 12:45 PM Phone [phone]

## 2019-06-22 NOTE — Progress Notes (Signed)
Wasted 2.12ml morphine from PCA into stericycle with Rebecca,RN.

## 2019-06-23 LAB — COMPREHENSIVE METABOLIC PANEL
ALT: 20 U/L (ref 0–44)
AST: 16 U/L (ref 15–41)
Albumin: 1.5 g/dL — ABNORMAL LOW (ref 3.5–5.0)
Alkaline Phosphatase: 91 U/L (ref 38–126)
Anion gap: 9 (ref 5–15)
BUN: 7 mg/dL (ref 6–20)
CO2: 24 mmol/L (ref 22–32)
Calcium: 8 mg/dL — ABNORMAL LOW (ref 8.9–10.3)
Chloride: 99 mmol/L (ref 98–111)
Creatinine, Ser: 0.61 mg/dL (ref 0.61–1.24)
GFR calc Af Amer: >60 mL/min (ref >60)
GFR calc non Af Amer: >60 mL/min (ref >60)
Glucose, Bld: 90 mg/dL (ref 70–99)
Potassium: 3.9 mmol/L (ref 3.5–5.1)
Sodium: 132 mmol/L — ABNORMAL LOW (ref 135–145)
Total Bilirubin: 0.5 mg/dL (ref 0.3–1.2)
Total Protein: 4.8 g/dL — ABNORMAL LOW (ref 6.5–8.1)

## 2019-06-23 LAB — CBC
HCT: 27.3 % — ABNORMAL LOW (ref 39.0–52.0)
Hemoglobin: 8.6 g/dL — ABNORMAL LOW (ref 13.0–17.0)
MCH: 28.3 pg (ref 26.0–34.0)
MCHC: 31.5 g/dL (ref 30.0–36.0)
MCV: 89.8 fL (ref 80.0–100.0)
Platelets: 432 10*3/uL — ABNORMAL HIGH (ref 150–400)
RBC: 3.04 MIL/uL — ABNORMAL LOW (ref 4.22–5.81)
RDW: 13.2 % (ref 11.5–15.5)
WBC: 14.6 10*3/uL — ABNORMAL HIGH (ref 4.0–10.5)
nRBC: 0 % (ref 0.0–0.2)

## 2019-06-23 LAB — MAGNESIUM: Magnesium: 1.7 mg/dL (ref 1.7–2.4)

## 2019-06-23 LAB — LIPASE, BLOOD
Lipase: 102 U/L — ABNORMAL HIGH (ref 11–51)
Lipase: 93 U/L — ABNORMAL HIGH (ref 11–51)

## 2019-06-23 MED ORDER — BISACODYL 5 MG PO TBEC
20.0000 mg | DELAYED_RELEASE_TABLET | Freq: Once | ORAL | Status: AC
  Administered 2019-06-23: 20 mg via ORAL
  Filled 2019-06-23: qty 4

## 2019-06-23 NOTE — Progress Notes (Signed)
Initial Nutrition Assessment  DOCUMENTATION CODES:   Not applicable  INTERVENTION:  Monitor for tolerance to Heart Healthy diet Ensure Enlive po BID, each supplement provides 350 kcal and 20 grams of protein MVI   NUTRITION DIAGNOSIS:   Increased nutrient needs related to chronic illness(chronic pancreatitis) as evidenced by estimated needs.   GOAL:   Patient will meet greater than or equal to 90% of their needs  MONITOR:   PO intake, Supplement acceptance, Weight trends, Labs, I & O's  REASON FOR ASSESSMENT:   Malnutrition Screening Tool    ASSESSMENT:  RD working remotely.  48 year old male with medical history significant of recurrent pancreatitis, 5 prior hospitalizations since May 2020 with most recent 7/14-7/23 at [facility] for idiopathic necrotizing pancreatitis. Patient admitted with recurrent/chronic pancreatitis, thrombocytosis, and left lower lob pneumonia.  Unable reach patient via phone after 2 attempts. Per chart review, pt was not a candidate for bile duct stent s/p MRCP during 7/14-7/23 admission at [facility] Patient reports continued abdominal discomfort and poor po since discharge about 10 days ago.  8/4: Per GI, consider pt transfer to [facility] 8/3 - tolerating CL diet, abdominal pain improving  Patient with 75% po today on HH diet. RD to order Ensure BID and monitor for tolerance of advanced diet.  Per review of [facility] admissions, pt experienced 19 lb wt loss over a month. Patient with 8 lb wt gain over the past 2 weeks. Suspect actual gains to be less considering wts taken at different facilities.  Current wt 75kg (165.4 lbs) 7/23: from [facility]- 71.3 kg 6/27: from [facility] - 79.6 kg  Medications reviewed and include: morphine, protonix, miralax, zosyn PRN toradol today  Labs: Lipase 93 - trending down WBC 14.6 trending down   NUTRITION - FOCUSED PHYSICAL EXAM: Unable to complete at this time   Diet Order:   Diet Order            Diet Heart Room  service appropriate? Yes; Fluid consistency: Thin  Diet effective now              EDUCATION NEEDS:   No education needs have been identified at this time  Skin:  Skin Assessment: Reviewed RN Assessment  Last BM:  8/2  Height:   Ht Readings from Last 1 Encounters:  06/21/19 6\' 1"  (1.854 m)    Weight:   Wt Readings from Last 1 Encounters:  06/21/19 75 kg    Ideal Body Weight:  83.6 kg  BMI:  Body mass index is 21.81 kg/m.  Estimated Nutritional Needs:   Kcal:  2150-2350  Protein:  108-118  Fluid:  >2.1L  Lajuan Lines, RD, LDN  After Hours/Weekend Pager: [phone]

## 2019-06-23 NOTE — Progress Notes (Signed)
PROGRESS NOTE    Kyle Mooney  YOV:[id number] DOB: Nov 01, 1971 DOA: 06/20/2019 PCP: Patient, No Pcp Per   Brief Narrative:  Per HPI: Kyle Mooney a 48 y.o.malewith medical history significant ofrecurrent pancreatitis. He has had 5 prior hospitalizations since May 2020. Most recent hospitalization was at [facility] 7/14 to 06/11/2019 for a diagnosis of idiopathic necrotizing pancreatitis. The patient was seen by GI and surgical services. He had MR CP which confirmed his necrotizing pancreatitis and also revealed multiple fluid collections in the peripancreatic region and throughout the abdomen. He was not felt to be a candidate for a bile duct stent. Patient during his hospitalization had a thrombocytosis which was thought to be reactive. He had a leukocytosis also thought to be reactive. Since being home the patient has continued to have abdominal discomfort. He has had poor p.o. intake. The day of admission he had a sudden increase in pain in the left flank and left lower back. His abdominal pain also became more intense. He was discharged from [facility] with a prescription for oxycodone and he did take this today. Patient called EMS and asked to be taken to [facility] but he was taken instead to [facility]. He reports that his pain is severe at a 10/10 particularly in the left upper quadrant of the abdomen. Does admit to having had a cough productive of a grayish-greenish sputum.He denies any frank shortness of breath.   Patient was admitted for recurrent acute on chronic pancreatitis with recent hospitalization at [facility] where he was noted to have necrotizing pancreatitis.  He has also had recent MRCP and placement of a bile duct stent was discussed at his last hospitalization, but was not performed.   8/3: Patient has improvement in his pain control and is currently tolerating clear liquid diet.    Subjective:  06/23/2019 -patient was seen by myself for  first time, apparently started on a clear soft diet by gastroenterologist he has been tolerating clears well but still complaining of excessive pain on PCA pump.  Denies any nausea or vomiting.   Lipase steadily improving, on azithromycin and Zosyn for presumed pneumonia.  Afebrile normotensive, improved leukocytosis.   Assessment & Plan:   Active Problems:   Chronic pancreatitis (HCC)   Thrombocytosis (HCC)   LLL pneumonia (HCC)   Recurrent left pleural effusion   Pancreatitis, recurrent   Acute on chronic pancreatitis (HCC)   Constipation   Recurrent abdominal pain secondary to idiopathic, persistent pancreatitis with pseudocysts -Continues to improve, but still complaining of pain -Continue current IV fluid hydration -Begin to wean pain management and Toradol has been changed to as needed -Begin to wean off PCA pump as tolerated >>> has not been successfully taken off PCA pump as of now -Advance diet per GI -Will require further follow-up at [facility] with GI team -Possible MRCP soon -Continue IV Zosyn for now  Leukocytosis likely secondary to pancreatitis -Improving -Continue to trend CBC  Left lower lobe fluid collection-moderate size -Denies shortness of breath, - Does not seem to be in any respiratory distress at this time. -Hold off on thoracentesis at this time given acute pancreatitis; would consider this once pancreatitis has resolved in outpatient setting -Continue on azithromycin and Zosyn for empiric coverage of possible pneumonia, but this is unlikely  Thrombocytosis-improving -Partially could be related to hemoconcentration as trend is downwards with IV fluid -Patient is noted to have some splenomegaly and will likely require further evaluation from hematologist in near future -Continue to trend  with CBC   DVT prophylaxis: Lovenox Code Status: Full Family Communication: None at bedside Disposition Plan: Continue ongoing treatment for acute pancreatitis  which appears to be slowly improving.  Anticipate discharge in the next 24 to 48 hours pending further GI evaluation and diet advancement.   Consultants:   GI  Procedures:   None  Antimicrobials:  Anti-infectives (From admission, onward)   Start     Dose/Rate Route Frequency Ordered Stop   06/21/19 2000  cefTRIAXone (ROCEPHIN) 1 g in sodium chloride 0.9 % 100 mL IVPB  Status:  Discontinued     1 g 200 mL/hr over 30 Minutes Intravenous Every 24 hours 06/21/19 0026 06/21/19 0710   06/21/19 2000  azithromycin (ZITHROMAX) 250 mg in dextrose 5 % 125 mL IVPB     250 mg 125 mL/hr over 60 Minutes Intravenous Every 24 hours 06/21/19 0026 06/25/19 1959   06/21/19 0800  piperacillin-tazobactam (ZOSYN) IVPB 3.375 g     3.375 g 12.5 mL/hr over 240 Minutes Intravenous Every 8 hours 06/21/19 0718     06/20/19 2015  cefTRIAXone (ROCEPHIN) 1 g in sodium chloride 0.9 % 100 mL IVPB     1 g 200 mL/hr over 30 Minutes Intravenous  Once 06/20/19 2008 06/21/19 0043   06/20/19 2015  azithromycin (ZITHROMAX) 500 mg in sodium chloride 0.9 % 250 mL IVPB     500 mg 250 mL/hr over 60 Minutes Intravenous  Once 06/20/19 2008 06/21/19 0102      Subjective: Patient seen and evaluated today with no new acute complaints or concerns. No acute concerns or events noted overnight.  He is able to tolerate his clear liquid diet and denies further abdominal pain this morning.  No nausea or vomiting noted.  Objective: Filed Weights   06/20/19 1910 06/21/19 0302  Weight: 68 kg 75 kg   BP 115/76 (BP Location: Right Arm)   Pulse 91   Temp 98.1 F (36.7 C) (Oral)   Resp 16   Ht 6\' 1"  (1.854 m)   Wt 75 kg   SpO2 100%   BMI 21.81 kg/m    Physical Exam  Constitution:  Alert, cooperative, no distress,  Psychiatric: Normal and stable mood and affect, cognition intact,   HEENT: Normocephalic, PERRL, otherwise with in Normal limits  Chest:Chest symmetric Cardio vascular:  S1/S2, RRR, No murmure, No Rubs or Gallops   pulmonary: Clear to auscultation bilaterally, respirations unlabored, negative wheezes / crackles Abdomen: Soft, diffuse abdominal tenderness, non-distended, bowel sounds,no masses, no organomegaly Muscular skeletal: Limited exam - in bed, able to move all 4 extremities, Normal strength,  Neuro: CNII-XII intact. , normal motor and sensation, reflexes intact  Extremities: No pitting edema lower extremities, +2 pulses  Skin: Dry, warm to touch, negative for any Rashes, No open wounds Wounds: per nursing documentation       Data Reviewed: I have personally reviewed following labs and imaging studies  CBC: Recent Labs  Lab 06/20/19 1915 06/21/19 0110 06/22/19 0200 06/23/19 0142  WBC 29.6* 21.1* 16.3* 14.6*  NEUTROABS 25.2*  --   --   --   HGB 14.6 11.9* 9.4* 8.6*  HCT 44.5 37.5* 29.8* 27.3*  MCV 87.1 89.3 89.0 89.8  PLT 754* 564* 439* 010*   Basic Metabolic Panel: Recent Labs  Lab 06/20/19 1915 06/21/19 0106 06/22/19 0200 06/23/19 0142  NA 130*  --  131* 132*  K 4.1  --  3.9 3.9  CL 94*  --  100 99  CO2 23  --  25 24  GLUCOSE 151*  --  105* 90  BUN 7  --  9 7  CREATININE 0.80 0.73 0.71 0.61  CALCIUM 9.2  --  8.2* 8.0*  MG  --   --  1.4* 1.7   GFR: Estimated Creatinine Clearance: 121.1 mL/min (by C-G formula based on SCr of 0.61 mg/dL). Liver Function Tests: Recent Labs  Lab 06/20/19 1915 06/22/19 0200 06/23/19 0142  AST 32 14* 16  ALT 50* 25 20  ALKPHOS 100 87 91  BILITOT 0.9 0.5 0.5  PROT 6.8 4.8* 4.8*  ALBUMIN 2.5* 1.7* 1.5*   Recent Labs  Lab 06/20/19 1915 06/21/19 0110 06/22/19 0200 06/23/19 0142  LIPASE 181* 161* 129* 102*  AMYLASE  --  354*  --   --    Anemia Panel: Recent Labs    06/21/19 0106  FERRITIN 553*   Sepsis Labs: Recent Labs  Lab 06/20/19 1910 06/21/19 0106  LATICACIDVEN 1.8 1.3    Recent Results (from the past 240 hour(s))  Blood Culture (routine x 2)     Status: None (Preliminary result)   Collection Time: 06/20/19   9:20 PM   Specimen: BLOOD RIGHT WRIST  Result Value Ref Range Status   Specimen Description BLOOD RIGHT WRIST  Final   Special Requests   Final    BOTTLES DRAWN AEROBIC AND ANAEROBIC Blood Culture results may not be optimal due to an inadequate volume of blood received in culture bottles   Culture   Final    NO GROWTH 3 DAYS Performed at [facility], [place] [address], [place], [place] [address]    Report Status PENDING  Incomplete  Blood Culture (routine x 2)     Status: None (Preliminary result)   Collection Time: 06/20/19  9:30 PM   Specimen: BLOOD RIGHT FOREARM  Result Value Ref Range Status   Specimen Description BLOOD RIGHT FOREARM  Final   Special Requests   Final    BOTTLES DRAWN AEROBIC AND ANAEROBIC Blood Culture adequate volume   Culture   Final    NO GROWTH 3 DAYS Performed at [facility], [place] [address], [place], [place] [address]    Report Status PENDING  Incomplete  SARS Coronavirus 2 [facility] order, Performed in [facility] hospital lab) Nasopharyngeal Nasopharyngeal Swab     Status: None   Collection Time: 06/20/19  9:33 PM   Specimen: Nasopharyngeal Swab  Result Value Ref Range Status   SARS Coronavirus 2 NEGATIVE NEGATIVE Final    Comment: (NOTE) If result is NEGATIVE SARS-CoV-2 target nucleic acids are NOT DETECTED. The SARS-CoV-2 RNA is generally detectable in upper and lower  respiratory specimens during the acute phase of infection. The lowest  concentration of SARS-CoV-2 viral copies this assay can detect is 250  copies / mL. A negative result does not preclude SARS-CoV-2 infection  and should not be used as the sole basis for treatment or other  patient management decisions.  A negative result may occur with  improper specimen collection / handling, submission of specimen other  than nasopharyngeal swab, presence of viral mutation(s) within the  areas targeted by this assay, and inadequate number of viral copies  (<250 copies / mL). A  negative result must be combined with clinical  observations, patient history, and epidemiological information. If result is POSITIVE SARS-CoV-2 target nucleic acids are DETECTED. The SARS-CoV-2 RNA is generally detectable in upper and lower  respiratory specimens dur ing the acute phase of infection.  Positive  results are indicative of active infection with SARS-CoV-2.  Clinical  correlation with patient history and other diagnostic information is  necessary to determine patient infection status.  Positive results do  not rule out bacterial infection or co-infection with other viruses. If result is PRESUMPTIVE POSTIVE SARS-CoV-2 nucleic acids MAY BE PRESENT.   A presumptive positive result was obtained on the submitted specimen  and confirmed on repeat testing.  While 2019 novel coronavirus  (SARS-CoV-2) nucleic acids may be present in the submitted sample  additional confirmatory testing may be necessary for epidemiological  and / or clinical management purposes  to differentiate between  SARS-CoV-2 and other Sarbecovirus currently known to infect humans.  If clinically indicated additional testing with an alternate test  methodology [phone]) is advised. The SARS-CoV-2 RNA is generally  detectable in upper and lower respiratory sp ecimens during the acute  phase of infection. The expected result is Negative. Fact Sheet for Patients:  StrictlyIdeas.no Fact Sheet for Healthcare Providers: BankingDealers.co.za This test is not yet approved or cleared by the [place] FDA and has been authorized for detection and/or diagnosis of SARS-CoV-2 by FDA under an Emergency Use Authorization (EUA).  This EUA will remain in effect (meaning this test can be used) for the duration of the COVID-19 declaration under Section 564(b)(1) of the Act, 21 U.S.C. section 360bbb-3(b)(1), unless the authorization is terminated or revoked sooner. Performed  at [facility], [place] [address], [place], [place] [address]   Urine culture     Status: None   Collection Time: 06/21/19  3:42 AM   Specimen: Urine, Clean Catch  Result Value Ref Range Status   Specimen Description URINE, CLEAN CATCH  Final   Special Requests NONE  Final   Culture   Final    NO GROWTH Performed at [facility], [place] [address] [place], [place] [address]    Report Status 06/22/2019 FINAL  Final      Scheduled Meds: . baclofen  10 mg Oral TID  . bisacodyl  20 mg Oral Once  . enoxaparin (LOVENOX) injection  40 mg Subcutaneous Daily  . feeding supplement  1 Container Oral TID BM  . feeding supplement (ENSURE ENLIVE)  237 mL Oral BID BM  . morphine   Intravenous Q4H  . pantoprazole  40 mg Oral Q0600  . polyethylene glycol  17 g Oral BID   Continuous Infusions: . sodium chloride 150 mL/hr at 06/23/19 1046  . azithromycin Stopped (06/23/19 3291)  . piperacillin-tazobactam (ZOSYN)  IV 3.375 g (06/23/19 0723)     LOS: 3 days    Time spent: 30 minutes  Deatra James, MD  [facility] Pager [phone]  If 7PM-7AM, please contact night-coverage [email or web address] Password [email or web address] 06/23/2019, 11:40 AM

## 2019-06-23 NOTE — Progress Notes (Signed)
          Daily Rounding Note  06/23/2019, 10:54 AM  LOS: 3 days   SUBJECTIVE:   Chief complaint: Recurrent pancreatitis.    Still having abd pain and using PCA Morphine "a couple of times and hour" (22 mg total yesterday).   No BM's yet despite 3 doses of Miralax since yest AM.   Tolerating HH diet.    OBJECTIVE:         Vital signs in last 24 hours:    Temp:  [98 F (36.7 C)-99.6 F (37.6 C)] 98.1 F (36.7 C) (08/04 1048) Pulse Rate:  [84-110] 91 (08/04 1048) Resp:  [12-22] 16 (08/04 1048) BP: (100-115)/(63-76) 115/76 (08/04 1048) SpO2:  [91 %-100 %] 100 % (08/04 1048) Last BM Date: 06/21/19 Filed Weights   06/20/19 1910 06/21/19 0302  Weight: 68 kg 75 kg   General: pale, chronically ill looking   Heart: RRR Chest: clear bil.   Abdomen: soft, tender without g/r diffuse but >> in upper abdomen.    Extremities: no CCE Neuro/Psych:  Pleasant, cooperative.    Intake/Output from previous day: 08/03 0701 - 08/04 0700 In: 6279.8 [P.O.:1640; I.V.:4364.8; IV Piggyback:275] Out: 671 [Urine:671]  Intake/Output this shift: Total I/O In: 360 [P.O.:360] Out: -   Lab Results: Recent Labs    06/21/19 0110 06/22/19 0200 06/23/19 0142  WBC 21.1* 16.3* 14.6*  HGB 11.9* 9.4* 8.6*  HCT 37.5* 29.8* 27.3*  PLT 564* 439* 432*   BMET Recent Labs    06/20/19 1915 06/21/19 0106 06/22/19 0200 06/23/19 0142  NA 130*  --  131* 132*  K 4.1  --  3.9 3.9  CL 94*  --  100 99  CO2 23  --  25 24  GLUCOSE 151*  --  105* 90  BUN 7  --  9 7  CREATININE 0.80 0.73 0.71 0.61  CALCIUM 9.2  --  8.2* 8.0*   LFT Recent Labs    06/20/19 1915 06/22/19 0200 06/23/19 0142  PROT 6.8 4.8* 4.8*  ALBUMIN 2.5* 1.7* 1.5*  AST 32 14* 16  ALT 50* 25 20  ALKPHOS 100 87 91  BILITOT 0.9 0.5 0.5     ASSESMENT:   *     Recurrent, idiopathic pancreatitis.  Pseudocysts, complex fluid collections on MRCP of 05/05/19.  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   Currently admission # 4 with pancreatitis.  Discharged from [facility] hospital about 10 days ago.   CTAP 06/20/19: Pancreatitis, multiple fluid collections, splenomegaly, left pleural effusion and partial left lower lobe collapse.  Reactive colon wall thickening of transverse and splenic flexure. Previous cholecystectomy, no history of choledocholithiasis.  Followed by digestive health in [place] and [facility] Pain continues, no fever, WBCs improving.    *    LLL PNA.     *    Normocytic anemia, declining Hgb.  No bloody or tarry stools.    *   Protein malnutrition.  Low albumin.     PLAN   *   May need to consider transfer to [facility] given ongoing pain. Asked pt to try to slow use of morphine, see how truly manageable vs unmanageable his pain is.        Azucena Freed  06/23/2019, 10:54 AM Phone [phone]

## 2019-06-24 ENCOUNTER — Inpatient Hospital Stay (HOSPITAL_COMMUNITY): Payer: Self-pay

## 2019-06-24 LAB — TROPONIN I (HIGH SENSITIVITY): Troponin I (High Sensitivity): 11 ng/L (ref <18)

## 2019-06-24 LAB — BASIC METABOLIC PANEL
Anion gap: 9 (ref 5–15)
BUN: 6 mg/dL (ref 6–20)
CO2: 23 mmol/L (ref 22–32)
Calcium: 8.1 mg/dL — ABNORMAL LOW (ref 8.9–10.3)
Chloride: 99 mmol/L (ref 98–111)
Creatinine, Ser: 0.66 mg/dL (ref 0.61–1.24)
GFR calc Af Amer: >60 mL/min (ref >60)
GFR calc non Af Amer: >60 mL/min (ref >60)
Glucose, Bld: 114 mg/dL — ABNORMAL HIGH (ref 70–99)
Potassium: 4.5 mmol/L (ref 3.5–5.1)
Sodium: 131 mmol/L — ABNORMAL LOW (ref 135–145)

## 2019-06-24 LAB — CBC
HCT: 27.7 % — ABNORMAL LOW (ref 39.0–52.0)
Hemoglobin: 9 g/dL — ABNORMAL LOW (ref 13.0–17.0)
MCH: 28.6 pg (ref 26.0–34.0)
MCHC: 32.5 g/dL (ref 30.0–36.0)
MCV: 87.9 fL (ref 80.0–100.0)
Platelets: 522 10*3/uL — ABNORMAL HIGH (ref 150–400)
RBC: 3.15 MIL/uL — ABNORMAL LOW (ref 4.22–5.81)
RDW: 13 % (ref 11.5–15.5)
WBC: 14.8 10*3/uL — ABNORMAL HIGH (ref 4.0–10.5)
nRBC: 0 % (ref 0.0–0.2)

## 2019-06-24 LAB — LIPASE, BLOOD: Lipase: 115 U/L — ABNORMAL HIGH (ref 11–51)

## 2019-06-24 MED ORDER — METOPROLOL TARTRATE 5 MG/5ML IV SOLN
5.0000 mg | Freq: Once | INTRAVENOUS | Status: AC
  Administered 2019-06-24: 5 mg via INTRAVENOUS
  Filled 2019-06-24: qty 5

## 2019-06-24 MED ORDER — OXYCODONE HCL 5 MG PO TABS
10.0000 mg | ORAL_TABLET | ORAL | Status: DC | PRN
  Administered 2019-06-24 – 2019-06-28 (×11): 10 mg via ORAL
  Filled 2019-06-24 (×12): qty 2

## 2019-06-24 MED ORDER — MORPHINE SULFATE (PF) 4 MG/ML IV SOLN
4.0000 mg | INTRAVENOUS | Status: DC | PRN
  Administered 2019-06-24: 2 mg via INTRAVENOUS
  Administered 2019-06-25 – 2019-06-27 (×5): 4 mg via INTRAVENOUS
  Filled 2019-06-24 (×6): qty 1

## 2019-06-24 NOTE — Progress Notes (Signed)
Dr. [place] notified of pt having CP, HR 148. Will continue to monitor

## 2019-06-24 NOTE — Progress Notes (Signed)
PROGRESS NOTE    Kyle Mooney  ZOX:[id number] DOB: [birth year] DOA: 06/20/2019 PCP: Patient, No Pcp Per    Brief Narrative:   Kyle Mooney a 48 y.o.malewith medical history significant ofrecurrent pancreatitis. He has had 5 prior hospitalizations since May 2020. Most recent hospitalization was at [facility] 7/14 to 06/11/2019 for a diagnosis of idiopathic necrotizing pancreatitis. The patient was seen by GI and surgical services. He had MRCP which confirmed his necrotizing pancreatitis and also revealed multiple fluid collections in the peripancreatic region and throughout the abdomen. He was not felt to be a candidate for a bile duct stent. Patient during his hospitalization had a thrombocytosis which was thought to be reactive. He had a leukocytosis also thought to be reactive. Since being home the patient has continued to have abdominal discomfort. He has had poor p.o. intake. The day of admission he had a sudden increase in pain in the left flank and left lower back. His abdominal pain also became more intense. He was discharged from [facility] with a prescription for oxycodone and he did take this today.   Patient called EMS and asked to be taken to [facility] but he was taken instead to [facility]. He reports that his pain is severe at a 10/10 particularly in the left upper quadrant of the abdomen. Does admit to having had a cough productive of a grayish-greenish sputum.He denies any frank shortness of breath.  Patient was admitted for recurrent acute on chronic pancreatitis with recent hospitalization at [facility] where he was noted to have necrotizing pancreatitis. He has also had recent MRCP and placement of a bile duct stent was discussed at his last hospitalization, but was not performed.    Assessment & Plan:   Active Problems:   Chronic pancreatitis (HCC)   Thrombocytosis (HCC)   LLL pneumonia (HCC)   Recurrent left pleural effusion  Pancreatitis, recurrent   Acute on chronic pancreatitis (HCC)   Constipation   Acute on chronic pancreatitis Patient presenting with recurrent epigastric pain likely secondary to from his idiopathic pancreatitis.  He is followed by digestive health at [facility], Dr. Carolynn Comment.  Lipase on admission noted to be elevated to 129.  CT abdomen/pelvis with contrast on admission notable for multiple peripancreatic fluid collections largest measuring 3.7 x 1.8 cm with enhancement of the pancreas, small volume free fluid in the pelvis, no evidence of pancreatic necrosis.  Patient was evaluated by [facility]; with no plans for endoscopic intervention during this hospitalization.  He was initially treated with aggressive IV fluid hydration, and morphine PCA for pain control. --Diet now advanced and tolerating; abdominal pain relatively resolved --Discontinue morphine PCA today --Start oxycodone 10 mg p.o. q4h prn and morphine 4mg  IV q4hr prn for severe breakthrough pain --Continue IV antibiotics with Zosyn; anticipate discharge tomorrow and transition to Augmentin to complete 14-day course --Will need follow-up with his primary gastroenterologist following discharge  Left lower lobe pneumonia Left pleural effusion CT abdomen/pelvis notable for left lower lobe consolidation. --Continue antibiotics with azithromycin and Zosyn   DVT prophylaxis: Lovenox Code Status: Full code Family Communication: None Disposition Plan: Continue inpatient hospitalization, transition off of PCA today, continue IV fluid hydration, anticipate likely discharge home tomorrow with follow-up with his primary gastroenterologist at [facility]   Consultants:   [facility]  Procedures:   None  Antimicrobials:   Zosyn  Azithromycin   Subjective: Patient seen and examined at bedside, resting comfortably.  States abdominal  discomfort has relatively resolved.  Has been  using less of his PCA.  States has already organized follow-up with his primary gastroenterologist.  Discussed with patient need to transition off of PCA pump today to oral pain medications.  No other complaints/concerns at this time.  Denies headache, no chest pain, no palpitations, no nausea/vomiting/diarrhea, no weakness, no issues with bowel/bladder function, no paresthesias.  No acute events overnight per nursing staff.  Objective: Vitals:   06/24/19 0601 06/24/19 0750 06/24/19 0835 06/24/19 1335  BP: 112/75 118/73  132/74  Pulse: (!) 123 (!) 122  (!) 105  Resp: 20 (!) 22 (!) 22 18  Temp: (!) 100.6 F (38.1 C) 99.4 F (37.4 C)  97.6 F (36.4 C)  TempSrc: Oral Oral  Oral  SpO2: 99% 97% 99% 100%  Weight:      Height:        Intake/Output Summary (Last 24 hours) at 06/24/2019 1426 Last data filed at 06/24/2019 1335 Gross per 24 hour  Intake 1086.13 ml  Output -  Net 1086.13 ml   Filed Weights   06/20/19 1910 06/21/19 0302  Weight: 68 kg 75 kg    Examination:  General exam: Appears calm and comfortable  Respiratory system: Clear to auscultation. Respiratory effort normal. Cardiovascular system: S1 & S2 heard, RRR. No JVD, murmurs, rubs, gallops or clicks. No pedal edema. Gastrointestinal system: Abdomen is nondistended, soft and nontender. No organomegaly or masses felt. Normal bowel sounds heard. Central nervous system: Alert and oriented. No focal neurological deficits. Extremities: Symmetric 5 x 5 power. Skin: No rashes, lesions or ulcers Psychiatry: Judgement and insight appear normal. Mood & affect appropriate.     Data Reviewed: I have personally reviewed following labs and imaging studies  CBC: Recent Labs  Lab 06/20/19 1915 06/21/19 0110 06/22/19 0200 06/23/19 0142 06/24/19 0818  WBC 29.6* 21.1* 16.3* 14.6* 14.8*  NEUTROABS 25.2*  --   --   --   --   HGB 14.6 11.9* 9.4* 8.6* 9.0*  HCT 44.5 37.5* 29.8* 27.3* 27.7*  MCV 87.1 89.3 89.0 89.8 87.9  PLT 754*  564* 439* 432* 522*   Basic Metabolic Panel: Recent Labs  Lab 06/20/19 1915 06/21/19 0106 06/22/19 0200 06/23/19 0142 06/24/19 0158  NA 130*  --  131* 132* 131*  K 4.1  --  3.9 3.9 4.5  CL 94*  --  100 99 99  CO2 23  --  25 24 23   GLUCOSE 151*  --  105* 90 114*  BUN 7  --  9 7 6   CREATININE 0.80 0.73 0.71 0.61 0.66  CALCIUM 9.2  --  8.2* 8.0* 8.1*  MG  --   --  1.4* 1.7  --    GFR: Estimated Creatinine Clearance: 121.1 mL/min (by C-G formula based on SCr of 0.66 mg/dL). Liver Function Tests: Recent Labs  Lab 06/20/19 1915 06/22/19 0200 06/23/19 0142  AST 32 14* 16  ALT 50* 25 20  ALKPHOS 100 87 91  BILITOT 0.9 0.5 0.5  PROT 6.8 4.8* 4.8*  ALBUMIN 2.5* 1.7* 1.5*   Recent Labs  Lab 06/21/19 0110 06/22/19 0200 06/23/19 0142 06/23/19 1136 06/24/19 0158  LIPASE 161* 129* 102* 93* 115*  AMYLASE 354*  --   --   --   --    No results for input(s): AMMONIA in the last 168 hours. Coagulation Profile: No results for input(s): INR, PROTIME in the last 168 hours. Cardiac Enzymes: No results for input(s): CKTOTAL, CKMB, CKMBINDEX, TROPONINI  in the last 168 hours. BNP (last 3 results) No results for input(s): PROBNP in the last 8760 hours. HbA1C: No results for input(s): HGBA1C in the last 72 hours. CBG: No results for input(s): GLUCAP in the last 168 hours. Lipid Profile: No results for input(s): CHOL, HDL, LDLCALC, TRIG, CHOLHDL, LDLDIRECT in the last 72 hours. Thyroid Function Tests: No results for input(s): TSH, T4TOTAL, FREET4, T3FREE, THYROIDAB in the last 72 hours. Anemia Panel: No results for input(s): VITAMINB12, FOLATE, FERRITIN, TIBC, IRON, RETICCTPCT in the last 72 hours. Sepsis Labs: Recent Labs  Lab 06/20/19 1910 06/21/19 0106  LATICACIDVEN 1.8 1.3    Recent Results (from the past 240 hour(s))  Blood Culture (routine x 2)     Status: None (Preliminary result)   Collection Time: 06/20/19  9:20 PM   Specimen: BLOOD RIGHT WRIST  Result Value Ref  Range Status   Specimen Description BLOOD RIGHT WRIST  Final   Special Requests   Final    BOTTLES DRAWN AEROBIC AND ANAEROBIC Blood Culture results may not be optimal due to an inadequate volume of blood received in culture bottles   Culture   Final    NO GROWTH 4 DAYS Performed at [facility], [address] [place], [place] [address]    Report Status PENDING  Incomplete  Blood Culture (routine x 2)     Status: None (Preliminary result)   Collection Time: 06/20/19  9:30 PM   Specimen: BLOOD RIGHT FOREARM  Result Value Ref Range Status   Specimen Description BLOOD RIGHT FOREARM  Final   Special Requests   Final    BOTTLES DRAWN AEROBIC AND ANAEROBIC Blood Culture adequate volume   Culture   Final    NO GROWTH 4 DAYS Performed at [facility], [address], [place], [place] [address]    Report Status PENDING  Incomplete  SARS Coronavirus 2 [facility] order, Performed in [facility] hospital lab) Nasopharyngeal Nasopharyngeal Swab     Status: None   Collection Time: 06/20/19  9:33 PM   Specimen: Nasopharyngeal Swab  Result Value Ref Range Status   SARS Coronavirus 2 NEGATIVE NEGATIVE Final    Comment: (NOTE) If result is NEGATIVE SARS-CoV-2 target nucleic acids are NOT DETECTED. The SARS-CoV-2 RNA is generally detectable in upper and lower  respiratory specimens during the acute phase of infection. The lowest  concentration of SARS-CoV-2 viral copies this assay can detect is 250  copies / mL. A negative result does not preclude SARS-CoV-2 infection  and should not be used as the sole basis for treatment or other  patient management decisions.  A negative result may occur with  improper specimen collection / handling, submission of specimen other  than nasopharyngeal swab, presence of viral mutation(s) within the  areas targeted by this assay, and inadequate number of viral copies  (<250 copies / mL). A negative result must be combined with clinical  observations,  patient history, and epidemiological information. If result is POSITIVE SARS-CoV-2 target nucleic acids are DETECTED. The SARS-CoV-2 RNA is generally detectable in upper and lower  respiratory specimens dur ing the acute phase of infection.  Positive  results are indicative of active infection with SARS-CoV-2.  Clinical  correlation with patient history and other diagnostic information is  necessary to determine patient infection status.  Positive results do  not rule out bacterial infection or co-infection with other viruses. If result is PRESUMPTIVE POSTIVE SARS-CoV-2 nucleic acids MAY BE PRESENT.   A presumptive  positive result was obtained on the submitted specimen  and confirmed on repeat testing.  While 2019 novel coronavirus  (SARS-CoV-2) nucleic acids may be present in the submitted sample  additional confirmatory testing may be necessary for epidemiological  and / or clinical management purposes  to differentiate between  SARS-CoV-2 and other Sarbecovirus currently known to infect humans.  If clinically indicated additional testing with an alternate test  methodology [phone]) is advised. The SARS-CoV-2 RNA is generally  detectable in upper and lower respiratory sp ecimens during the acute  phase of infection. The expected result is Negative. Fact Sheet for Patients:  BoilerBrush.com.cy Fact Sheet for Healthcare Providers: https://pope.com/ This test is not yet approved or cleared by the [place] FDA and has been authorized for detection and/or diagnosis of SARS-CoV-2 by FDA under an Emergency Use Authorization (EUA).  This EUA will remain in effect (meaning this test can be used) for the duration of the COVID-19 declaration under Section 564(b)(1) of the Act, 21 U.S.C. section 360bbb-3(b)(1), unless the authorization is terminated or revoked sooner. Performed at [facility], [address], [place], [place]  [address]   Urine culture     Status: None   Collection Time: 06/21/19  3:42 AM   Specimen: Urine, Clean Catch  Result Value Ref Range Status   Specimen Description URINE, CLEAN CATCH  Final   Special Requests NONE  Final   Culture   Final    NO GROWTH Performed at [facility], [address], [place], [place] [address]    Report Status 06/22/2019 FINAL  Final         Radiology Studies: No results found.      Scheduled Meds: . baclofen  10 mg Oral TID  . enoxaparin (LOVENOX) injection  40 mg Subcutaneous Daily  . feeding supplement  1 Container Oral TID BM  . feeding supplement (ENSURE ENLIVE)  237 mL Oral BID BM  . pantoprazole  40 mg Oral Q0600  . polyethylene glycol  17 g Oral BID   Continuous Infusions: . sodium chloride 150 mL/hr at 06/24/19 0158  . azithromycin 250 mg (06/23/19 1931)  . piperacillin-tazobactam (ZOSYN)  IV 3.375 g (06/24/19 0817)     LOS: 4 days    Time spent: 36 minutes spent on chart review, personally reviewing all imaging studies, labs, discussion with nursing staff, consultants, updating family and interview/physical exam; more than 50% of that time was spent in counseling and/or coordination of care.    Alvira Philips [place], DO [facility] Pager [phone]  If 7PM-7AM, please contact night-coverage [email or web address] Password [email or web address] 06/24/2019, 2:26 PM

## 2019-06-25 ENCOUNTER — Inpatient Hospital Stay (HOSPITAL_COMMUNITY): Payer: Self-pay

## 2019-06-25 DIAGNOSIS — J90 Pleural effusion, not elsewhere classified: Secondary | ICD-10-CM

## 2019-06-25 LAB — BASIC METABOLIC PANEL
Anion gap: 11 (ref 5–15)
BUN: <5 mg/dL — ABNORMAL LOW (ref 6–20)
CO2: 23 mmol/L (ref 22–32)
Calcium: 8 mg/dL — ABNORMAL LOW (ref 8.9–10.3)
Chloride: 96 mmol/L — ABNORMAL LOW (ref 98–111)
Creatinine, Ser: 0.48 mg/dL — ABNORMAL LOW (ref 0.61–1.24)
GFR calc Af Amer: >60 mL/min (ref >60)
GFR calc non Af Amer: >60 mL/min (ref >60)
Glucose, Bld: 107 mg/dL — ABNORMAL HIGH (ref 70–99)
Potassium: 3.5 mmol/L (ref 3.5–5.1)
Sodium: 130 mmol/L — ABNORMAL LOW (ref 135–145)

## 2019-06-25 LAB — CULTURE, BLOOD (ROUTINE X 2)
Culture: NO GROWTH
Culture: NO GROWTH
Special Requests: ADEQUATE

## 2019-06-25 LAB — CBC
HCT: 32.8 % — ABNORMAL LOW (ref 39.0–52.0)
Hemoglobin: 10.4 g/dL — ABNORMAL LOW (ref 13.0–17.0)
MCH: 28 pg (ref 26.0–34.0)
MCHC: 31.7 g/dL (ref 30.0–36.0)
MCV: 88.2 fL (ref 80.0–100.0)
Platelets: 696 10*3/uL — ABNORMAL HIGH (ref 150–400)
RBC: 3.72 MIL/uL — ABNORMAL LOW (ref 4.22–5.81)
RDW: 13.2 % (ref 11.5–15.5)
WBC: 21.6 10*3/uL — ABNORMAL HIGH (ref 4.0–10.5)
nRBC: 0 % (ref 0.0–0.2)

## 2019-06-25 LAB — LIPASE, BLOOD: Lipase: 212 U/L — ABNORMAL HIGH (ref 11–51)

## 2019-06-25 LAB — SARS CORONAVIRUS 2 (TAT 6-24 HRS): SARS Coronavirus 2: NEGATIVE

## 2019-06-25 MED ORDER — IOHEXOL 300 MG/ML  SOLN
100.0000 mL | Freq: Once | INTRAMUSCULAR | Status: AC | PRN
  Administered 2019-06-25: 100 mL via INTRAVENOUS

## 2019-06-25 MED ORDER — FUROSEMIDE 10 MG/ML IJ SOLN
40.0000 mg | Freq: Two times a day (BID) | INTRAMUSCULAR | Status: DC
  Administered 2019-06-25: 16:00:00 40 mg via INTRAVENOUS
  Filled 2019-06-25: qty 4

## 2019-06-25 MED ORDER — POTASSIUM CHLORIDE CRYS ER 20 MEQ PO TBCR
40.0000 meq | EXTENDED_RELEASE_TABLET | Freq: Once | ORAL | Status: AC
  Administered 2019-06-25: 40 meq via ORAL
  Filled 2019-06-25: qty 2

## 2019-06-25 NOTE — Progress Notes (Signed)
PROGRESS NOTE    Kyle Mooney  NUU:[id number] DOB: [birth year] DOA: 06/20/2019 PCP: Patient, No Pcp Per    Brief Narrative:   Kyle Mooney a 48 y.o.malewith medical history significant ofrecurrent pancreatitis. He has had 5 prior hospitalizations since May 2020. Most recent hospitalization was at [facility] 7/14 to 06/11/2019 for a diagnosis of idiopathic necrotizing pancreatitis. The patient was seen by GI and surgical services. He had MRCP which confirmed his necrotizing pancreatitis and also revealed multiple fluid collections in the peripancreatic region and throughout the abdomen. He was not felt to be a candidate for a bile duct stent. Patient during his hospitalization had a thrombocytosis which was thought to be reactive. He had a leukocytosis also thought to be reactive. Since being home the patient has continued to have abdominal discomfort. He has had poor p.o. intake. The day of admission he had a sudden increase in pain in the left flank and left lower back. His abdominal pain also became more intense. He was discharged from [facility] with a prescription for oxycodone and he did take this today.   Patient called EMS and asked to be taken to [facility] but he was taken instead to [facility]. He reports that his pain is severe at a 10/10 particularly in the left upper quadrant of the abdomen. Does admit to having had a cough productive of a grayish-greenish sputum.He denies any frank shortness of breath.  Patient was admitted for recurrent acute on chronic pancreatitis with recent hospitalization at [facility] where he was noted to have necrotizing pancreatitis. He has also had recent MRCP and placement of a bile duct stent was discussed at his last hospitalization, but was not performed.    Assessment & Plan:   Active Problems:   Chronic pancreatitis (HCC)   Thrombocytosis (HCC)   LLL pneumonia (HCC)   Recurrent left pleural effusion  Pancreatitis, recurrent   Acute on chronic pancreatitis (HCC)   Constipation   Acute on chronic pancreatitis with multiple peripancreatic fluid collections Patient presenting with recurrent epigastric pain likely secondary to from his idiopathic pancreatitis.  He is followed by digestive health at [facility], Dr. Carolynn Comment.  Lipase on admission noted to be elevated to 129.  CT abdomen/pelvis with contrast on admission notable for multiple peripancreatic fluid collections largest measuring 3.7 x 1.8 cm with enhancement of the pancreas, small volume free fluid in the pelvis, no evidence of pancreatic necrosis.  Patient was evaluated by [facility]; with no plans for endoscopic intervention during this hospitalization.  He was initially treated with aggressive IV fluid hydration, and morphine PCA for pain control; in which PCA now has been discontinued. --Continues with abdominal discomfort and worsening abdominal distention --Leukocytosis worsening 14.8-->21.6 --Check CT abdomen/pelvis with contrast to evaluate interval worsening of peripancreatic fluid collections --If fluid collections are worse, may need GI reconsult versus general surgery evaluation --oxycodone 10 mg p.o. q4h prn and morphine 4mg  IV q4hr prn for severe breakthrough pain --Continue IV antibiotics with Zosyn  Left lower lobe pneumonia Left pleural effusion CT abdomen/pelvis notable for left lower lobe consolidation. --Continue antibiotics with azithromycin and Zosyn --Check CT chest for evaluation of left-sided pleural effusion to determine if any loculations present --IR for thoracentesis with labs to include amylase to evaluate for possible pancreatic pleural fistula as etiology of pleural effusion vs exudative/transudate of effusion   DVT prophylaxis: Lovenox Code Status: Full code Family Communication: None Disposition Plan: Continue inpatient hospitalization, check CT chest/abdomen/pelvis, IR for  thoracentesis on left, further dependent on clinical course   Consultants:    GI - signed off 8/4  IR  Procedures:   Paracentesis: Pending  Antimicrobials:   Zosyn  Azithromycin   Subjective:  Patient seen and examined at bedside, mildly distressed.  Patient reports pleuritic chest pain overnight with worsening abdominal symptoms.  Also reports decreased appetite and worsening abdominal distention.  Also with mild nausea without vomiting.  Reports some diarrhea.  Notable worsening leukocytosis, concern for interval increase in his peripancreatic fluid collections.  Also notable for significant pleural effusion.  No other complaints or concerns at this time.  Denies headache, no fever/chills/night sweats, no palpitations, no vomiting, no weakness, no issues with bowel/bladder function, no paresthesias.  No acute events overnight per nursing staff.  Objective: Vitals:   06/24/19 2330 06/25/19 0439 06/25/19 0528 06/25/19 0620  BP:  130/82    Pulse: (!) 103 (!) 132 (!) 126 (!) 108  Resp:  20    Temp:  99.3 F (37.4 C)    TempSrc:  Oral    SpO2:  96%    Weight:      Height:        Intake/Output Summary (Last 24 hours) at 06/25/2019 1055 Last data filed at 06/25/2019 0907 Gross per 24 hour  Intake 2504.9 ml  Output -  Net 2504.9 ml   Filed Weights   06/20/19 1910 06/21/19 0302  Weight: 68 kg 75 kg    Examination:  General exam: Appears calm and comfortable  Respiratory system: Breath sounds slightly decreased left base, otherwise clear to auscultation, normal respiratory effort, on room air Cardiovascular system: S1 & S2 heard, tachycardic, regular rhythm. No JVD, murmurs, rubs, gallops or clicks.  Trace bilateral pedal edema Gastrointestinal system: Abdomen is distended, mild generalized tenderness to palpation, No organomegaly or masses felt. Normal bowel sounds heard. Central nervous system: Alert and oriented. No focal neurological deficits. Extremities:  Symmetric 5 x 5 power. Skin: No rashes, lesions or ulcers Psychiatry: Judgement and insight appear normal. Mood & affect appropriate.     Data Reviewed: I have personally reviewed following labs and imaging studies  CBC: Recent Labs  Lab 06/20/19 1915 06/21/19 0110 06/22/19 0200 06/23/19 0142 06/24/19 0818 06/25/19 0400  WBC 29.6* 21.1* 16.3* 14.6* 14.8* 21.6*  NEUTROABS 25.2*  --   --   --   --   --   HGB 14.6 11.9* 9.4* 8.6* 9.0* 10.4*  HCT 44.5 37.5* 29.8* 27.3* 27.7* 32.8*  MCV 87.1 89.3 89.0 89.8 87.9 88.2  PLT 754* 564* 439* 432* 522* 696*   Basic Metabolic Panel: Recent Labs  Lab 06/20/19 1915 06/21/19 0106 06/22/19 0200 06/23/19 0142 06/24/19 0158 06/25/19 0400  NA 130*  --  131* 132* 131* 130*  K 4.1  --  3.9 3.9 4.5 3.5  CL 94*  --  100 99 99 96*  CO2 23  --  25 24 23 23   GLUCOSE 151*  --  105* 90 114* 107*  BUN 7  --  9 7 6  <5*  CREATININE 0.80 0.73 0.71 0.61 0.66 0.48*  CALCIUM 9.2  --  8.2* 8.0* 8.1* 8.0*  MG  --   --  1.4* 1.7  --   --    GFR: Estimated Creatinine Clearance: 121.1 mL/min (A) (by C-G formula based on SCr of 0.48 mg/dL (L)). Liver Function Tests: Recent Labs  Lab 06/20/19 1915 06/22/19 0200 06/23/19 0142  AST 32 14* 16  ALT 50* 25 20  ALKPHOS 100 87 91  BILITOT 0.9 0.5 0.5  PROT 6.8 4.8* 4.8*  ALBUMIN 2.5* 1.7* 1.5*   Recent Labs  Lab 06/21/19 0110 06/22/19 0200 06/23/19 0142 06/23/19 1136 06/24/19 0158 06/25/19 0400  LIPASE 161* 129* 102* 93* 115* 212*  AMYLASE 354*  --   --   --   --   --    No results for input(s): AMMONIA in the last 168 hours. Coagulation Profile: No results for input(s): INR, PROTIME in the last 168 hours. Cardiac Enzymes: No results for input(s): CKTOTAL, CKMB, CKMBINDEX, TROPONINI in the last 168 hours. BNP (last 3 results) No results for input(s): PROBNP in the last 8760 hours. HbA1C: No results for input(s): HGBA1C in the last 72 hours. CBG: No results for input(s): GLUCAP in the  last 168 hours. Lipid Profile: No results for input(s): CHOL, HDL, LDLCALC, TRIG, CHOLHDL, LDLDIRECT in the last 72 hours. Thyroid Function Tests: No results for input(s): TSH, T4TOTAL, FREET4, T3FREE, THYROIDAB in the last 72 hours. Anemia Panel: No results for input(s): VITAMINB12, FOLATE, FERRITIN, TIBC, IRON, RETICCTPCT in the last 72 hours. Sepsis Labs: Recent Labs  Lab 06/20/19 1910 06/21/19 0106  LATICACIDVEN 1.8 1.3    Recent Results (from the past 240 hour(s))  Blood Culture (routine x 2)     Status: None (Preliminary result)   Collection Time: 06/20/19  9:20 PM   Specimen: BLOOD RIGHT WRIST  Result Value Ref Range Status   Specimen Description BLOOD RIGHT WRIST  Final   Special Requests   Final    BOTTLES DRAWN AEROBIC AND ANAEROBIC Blood Culture results may not be optimal due to an inadequate volume of blood received in culture bottles   Culture   Final    NO GROWTH 4 DAYS Performed at [facility], [address], [place], [place] [address]    Report Status PENDING  Incomplete  Blood Culture (routine x 2)     Status: None (Preliminary result)   Collection Time: 06/20/19  9:30 PM   Specimen: BLOOD RIGHT FOREARM  Result Value Ref Range Status   Specimen Description BLOOD RIGHT FOREARM  Final   Special Requests   Final    BOTTLES DRAWN AEROBIC AND ANAEROBIC Blood Culture adequate volume   Culture   Final    NO GROWTH 4 DAYS Performed at [facility], [address], [place], [place] [address]    Report Status PENDING  Incomplete  SARS Coronavirus 2 [facility] order, Performed in [facility] hospital lab) Nasopharyngeal Nasopharyngeal Swab     Status: None   Collection Time: 06/20/19  9:33 PM   Specimen: Nasopharyngeal Swab  Result Value Ref Range Status   SARS Coronavirus 2 NEGATIVE NEGATIVE Final    Comment: (NOTE) If result is NEGATIVE SARS-CoV-2 target nucleic acids are NOT DETECTED. The SARS-CoV-2 RNA is generally detectable in upper and lower   respiratory specimens during the acute phase of infection. The lowest  concentration of SARS-CoV-2 viral copies this assay can detect is 250  copies / mL. A negative result does not preclude SARS-CoV-2 infection  and should not be used as the sole basis for treatment or other  patient management decisions.  A negative result may occur with  improper specimen collection / handling, submission of specimen other  than nasopharyngeal swab, presence of viral mutation(s) within the  areas targeted by this assay, and inadequate number of viral copies  (<250 copies / mL). A negative result must be combined with  clinical  observations, patient history, and epidemiological information. If result is POSITIVE SARS-CoV-2 target nucleic acids are DETECTED. The SARS-CoV-2 RNA is generally detectable in upper and lower  respiratory specimens dur ing the acute phase of infection.  Positive  results are indicative of active infection with SARS-CoV-2.  Clinical  correlation with patient history and other diagnostic information is  necessary to determine patient infection status.  Positive results do  not rule out bacterial infection or co-infection with other viruses. If result is PRESUMPTIVE POSTIVE SARS-CoV-2 nucleic acids MAY BE PRESENT.   A presumptive positive result was obtained on the submitted specimen  and confirmed on repeat testing.  While 2019 novel coronavirus  (SARS-CoV-2) nucleic acids may be present in the submitted sample  additional confirmatory testing may be necessary for epidemiological  and / or clinical management purposes  to differentiate between  SARS-CoV-2 and other Sarbecovirus currently known to infect humans.  If clinically indicated additional testing with an alternate test  methodology [phone]) is advised. The SARS-CoV-2 RNA is generally  detectable in upper and lower respiratory sp ecimens during the acute  phase of infection. The expected result is Negative. Fact  Sheet for Patients:  BoilerBrush.com.cy Fact Sheet for Healthcare Providers: https://pope.com/ This test is not yet approved or cleared by the [place] FDA and has been authorized for detection and/or diagnosis of SARS-CoV-2 by FDA under an Emergency Use Authorization (EUA).  This EUA will remain in effect (meaning this test can be used) for the duration of the COVID-19 declaration under Section 564(b)(1) of the Act, 21 U.S.C. section 360bbb-3(b)(1), unless the authorization is terminated or revoked sooner. Performed at [facility], [address] [place], [place] [address]   Urine culture     Status: None   Collection Time: 06/21/19  3:42 AM   Specimen: Urine, Clean Catch  Result Value Ref Range Status   Specimen Description URINE, CLEAN CATCH  Final   Special Requests NONE  Final   Culture   Final    NO GROWTH Performed at [facility], [address], [place], [place] [address]    Report Status 06/22/2019 FINAL  Final         Radiology Studies: Dg Chest Port 1 View  Result Date: 06/24/2019 CLINICAL DATA:  Chest pain for 1 hour EXAM: PORTABLE CHEST 1 VIEW COMPARISON:  06/20/2019 FINDINGS: Left-sided pleural effusion is again seen and slightly enlarged when compare with the prior study. Likely underlying atelectasis/infiltrate is present in the left base. The right lung is clear. No bony abnormality is noted. IMPRESSION: Increasing left-sided pleural effusion. Electronically Signed   By: Alcide Clever M.D.   On: 06/24/2019 19:33        Scheduled Meds: . baclofen  10 mg Oral TID  . enoxaparin (LOVENOX) injection  40 mg Subcutaneous Daily  . feeding supplement  1 Container Oral TID BM  . feeding supplement (ENSURE ENLIVE)  237 mL Oral BID BM  . pantoprazole  40 mg Oral Q0600  . polyethylene glycol  17 g Oral BID   Continuous Infusions: . sodium chloride 150 mL/hr at 06/25/19 0933  .  piperacillin-tazobactam (ZOSYN)  IV 3.375 g (06/25/19 0816)     LOS: 5 days    Time spent: 39 minutes spent on chart review, personally reviewing all imaging studies, labs, discussion with nursing staff, consultants, updating family and interview/physical exam; more than 50% of that time was spent in counseling and/or coordination of care.  Alvira Philips [place], DO [facility] Pager [phone]  If 7PM-7AM, please contact night-coverage [email or web address] Password [email or web address] 06/25/2019, 10:55 AM

## 2019-06-25 NOTE — TOC Progression Note (Signed)
Transition of Care [facility]) - Progression Note    Patient Details  Name: Kyle Mooney MRN: [id number] Date of Birth: September 11, 1971  [facility] CM/SW Contact  Jacalyn Lefevre Edson Snowball, RN Phone Number: 06/25/2019, 10:16 AM  Clinical Narrative:     See previous note , continue to follow for medication assistance.   Expected Discharge Plan: Home/Self Care Barriers to Discharge: Continued Medical Work up  Expected Discharge Plan and Services Expected Discharge Plan: Home/Self Care   Discharge Planning Services: CM Consult, MATCH Program, Medication Assistance   Living arrangements for the past 2 months: Single Family Home                 DME Arranged: N/A         HH Arranged: NA           Social Determinants of Health (SDOH) Interventions    Readmission Risk Interventions No flowsheet data found.

## 2019-06-25 NOTE — Plan of Care (Signed)
  Problem: Activity: Goal: Risk for activity intolerance will decrease Outcome: Progressing   Problem: Nutrition: Goal: Adequate nutrition will be maintained Outcome: Progressing   

## 2019-06-25 NOTE — Plan of Care (Signed)
  Problem: Pain Managment: Goal: General experience of comfort will improve Outcome: Progressing   

## 2019-06-26 ENCOUNTER — Inpatient Hospital Stay (HOSPITAL_COMMUNITY): Payer: Self-pay

## 2019-06-26 ENCOUNTER — Encounter (HOSPITAL_COMMUNITY): Payer: Self-pay | Admitting: Radiology

## 2019-06-26 HISTORY — PX: IR THORACENTESIS ASP PLEURAL SPACE W/IMG GUIDE: IMG5380

## 2019-06-26 LAB — COMPREHENSIVE METABOLIC PANEL
ALT: 28 U/L (ref 0–44)
AST: 28 U/L (ref 15–41)
Albumin: 1.4 g/dL — ABNORMAL LOW (ref 3.5–5.0)
Alkaline Phosphatase: 123 U/L (ref 38–126)
Anion gap: 11 (ref 5–15)
BUN: <5 mg/dL — ABNORMAL LOW (ref 6–20)
CO2: 27 mmol/L (ref 22–32)
Calcium: 8.2 mg/dL — ABNORMAL LOW (ref 8.9–10.3)
Chloride: 92 mmol/L — ABNORMAL LOW (ref 98–111)
Creatinine, Ser: 0.68 mg/dL (ref 0.61–1.24)
GFR calc Af Amer: >60 mL/min (ref >60)
GFR calc non Af Amer: >60 mL/min (ref >60)
Glucose, Bld: 87 mg/dL (ref 70–99)
Potassium: 3.7 mmol/L (ref 3.5–5.1)
Sodium: 130 mmol/L — ABNORMAL LOW (ref 135–145)
Total Bilirubin: 0.6 mg/dL (ref 0.3–1.2)
Total Protein: 4.8 g/dL — ABNORMAL LOW (ref 6.5–8.1)

## 2019-06-26 LAB — GRAM STAIN

## 2019-06-26 LAB — PROTEIN, PLEURAL OR PERITONEAL FLUID: Total protein, fluid: <3 g/dL

## 2019-06-26 LAB — GLUCOSE, PLEURAL OR PERITONEAL FLUID: Glucose, Fluid: 84 mg/dL

## 2019-06-26 LAB — CBC
HCT: 28 % — ABNORMAL LOW (ref 39.0–52.0)
Hemoglobin: 9 g/dL — ABNORMAL LOW (ref 13.0–17.0)
MCH: 28 pg (ref 26.0–34.0)
MCHC: 32.1 g/dL (ref 30.0–36.0)
MCV: 87 fL (ref 80.0–100.0)
Platelets: 674 10*3/uL — ABNORMAL HIGH (ref 150–400)
RBC: 3.22 MIL/uL — ABNORMAL LOW (ref 4.22–5.81)
RDW: 13.2 % (ref 11.5–15.5)
WBC: 21.2 10*3/uL — ABNORMAL HIGH (ref 4.0–10.5)
nRBC: 0 % (ref 0.0–0.2)

## 2019-06-26 LAB — SAVE SMEAR(SSMR), FOR PROVIDER SLIDE REVIEW

## 2019-06-26 LAB — LACTATE DEHYDROGENASE, PLEURAL OR PERITONEAL FLUID: LD, Fluid: 165 U/L — ABNORMAL HIGH (ref 3–23)

## 2019-06-26 LAB — BODY FLUID CELL COUNT WITH DIFFERENTIAL
Lymphs, Fluid: 3 %
Monocyte-Macrophage-Serous Fluid: 17 % — ABNORMAL LOW (ref 50–90)
Neutrophil Count, Fluid: 80 % — ABNORMAL HIGH (ref 0–25)
Total Nucleated Cell Count, Fluid: 5940 cu mm — ABNORMAL HIGH (ref 0–1000)

## 2019-06-26 LAB — LACTATE DEHYDROGENASE: LDH: 158 U/L (ref 98–192)

## 2019-06-26 LAB — PROCALCITONIN: Procalcitonin: 0.59 ng/mL

## 2019-06-26 LAB — LIPASE, BLOOD: Lipase: 193 U/L — ABNORMAL HIGH (ref 11–51)

## 2019-06-26 LAB — AMYLASE, PLEURAL OR PERITONEAL FLUID: Amylase, Fluid: 1574 U/L

## 2019-06-26 LAB — LACTIC ACID, PLASMA: Lactic Acid, Venous: 0.6 mmol/L (ref 0.5–1.9)

## 2019-06-26 LAB — AMYLASE: Amylase: 433 U/L — ABNORMAL HIGH (ref 28–100)

## 2019-06-26 MED ORDER — PRO-STAT SUGAR FREE PO LIQD
30.0000 mL | Freq: Three times a day (TID) | ORAL | Status: DC
  Administered 2019-06-26 – 2019-06-28 (×6): 30 mL via ORAL
  Filled 2019-06-26 (×6): qty 30

## 2019-06-26 MED ORDER — METHOCARBAMOL 500 MG PO TABS
500.0000 mg | ORAL_TABLET | Freq: Four times a day (QID) | ORAL | Status: DC | PRN
  Administered 2019-06-26 (×2): 500 mg via ORAL
  Filled 2019-06-26 (×2): qty 1

## 2019-06-26 MED ORDER — PIPERACILLIN-TAZOBACTAM 3.375 G IVPB
3.3750 g | Freq: Three times a day (TID) | INTRAVENOUS | Status: DC
  Administered 2019-06-26 – 2019-06-28 (×6): 3.375 g via INTRAVENOUS
  Filled 2019-06-26 (×6): qty 50

## 2019-06-26 MED ORDER — METOPROLOL TARTRATE 12.5 MG HALF TABLET
12.5000 mg | ORAL_TABLET | Freq: Two times a day (BID) | ORAL | Status: DC
  Administered 2019-06-26 – 2019-06-28 (×4): 12.5 mg via ORAL
  Filled 2019-06-26 (×4): qty 1

## 2019-06-26 MED ORDER — ENOXAPARIN SODIUM 40 MG/0.4ML ~~LOC~~ SOLN
40.0000 mg | SUBCUTANEOUS | Status: DC
  Filled 2019-06-26: qty 0.4

## 2019-06-26 MED ORDER — FUROSEMIDE 10 MG/ML IJ SOLN
40.0000 mg | Freq: Two times a day (BID) | INTRAMUSCULAR | Status: DC
  Administered 2019-06-26 – 2019-06-28 (×5): 40 mg via INTRAVENOUS
  Filled 2019-06-26 (×5): qty 4

## 2019-06-26 MED ORDER — LIDOCAINE HCL (PF) 1 % IJ SOLN
INTRAMUSCULAR | Status: DC | PRN
  Administered 2019-06-26: 5 mL

## 2019-06-26 MED ORDER — ADULT MULTIVITAMIN W/MINERALS CH
1.0000 | ORAL_TABLET | Freq: Every day | ORAL | Status: DC
  Administered 2019-06-26 – 2019-06-28 (×3): 1 via ORAL
  Filled 2019-06-26 (×3): qty 1

## 2019-06-26 MED ORDER — LIDOCAINE HCL 1 % IJ SOLN
INTRAMUSCULAR | Status: AC
  Filled 2019-06-26: qty 20

## 2019-06-26 NOTE — Plan of Care (Signed)
  Problem: Elimination: Goal: Will not experience complications related to bowel motility Outcome: Progressing Goal: Will not experience complications related to urinary retention Outcome: Progressing   

## 2019-06-26 NOTE — Progress Notes (Signed)
PROGRESS NOTE    Kyle Mooney  ZOX:[id number] DOB: 1971-04-15 DOA: 06/20/2019 PCP: Patient, No Pcp Per    Brief Narrative:   Kyle Mooney a 48 y.o.malewith medical history significant ofrecurrent pancreatitis. He has had 5 prior hospitalizations since May 2020. Most recent hospitalization was at [facility] 7/14 to 06/11/2019 for a diagnosis of idiopathic necrotizing pancreatitis. The patient was seen by GI and surgical services. He had MRCP which confirmed his necrotizing pancreatitis and also revealed multiple fluid collections in the peripancreatic region and throughout the abdomen. He was not felt to be a candidate for a bile duct stent. Patient during his hospitalization had a thrombocytosis which was thought to be reactive. He had a leukocytosis also thought to be reactive. Since being home the patient has continued to have abdominal discomfort. He has had poor p.o. intake. The day of admission he had a sudden increase in pain in the left flank and left lower back. His abdominal pain also became more intense. He was discharged from [facility] with a prescription for oxycodone and he did take this today.   Patient called EMS and asked to be taken to [facility] but he was taken instead to [facility]. He reports that his pain is severe at a 10/10 particularly in the left upper quadrant of the abdomen. Does admit to having had a cough productive of a grayish-greenish sputum.He denies any frank shortness of breath.  Patient was admitted for recurrent acute on chronic pancreatitis with recent hospitalization at [facility] where he was noted to have necrotizing pancreatitis. He has also had recent MRCP and placement of a bile duct stent was discussed at his last hospitalization, but was not performed.    Assessment & Plan:   Active Problems:   Chronic pancreatitis (HCC)   Thrombocytosis (HCC)   LLL pneumonia (HCC)   Recurrent left pleural effusion  Pancreatitis, recurrent   Acute on chronic pancreatitis (HCC)   Constipation   Acute on chronic pancreatitis with multiple peripancreatic fluid collections Patient presenting with recurrent epigastric pain likely secondary to from his idiopathic pancreatitis.  He is followed by digestive health at [facility], Dr. Carolynn Comment.  Lipase on admission noted to be elevated to 129.  CT abdomen/pelvis with contrast on admission notable for multiple peripancreatic fluid collections largest measuring 3.7 x 1.8 cm with enhancement of the pancreas, small volume free fluid in the pelvis, no evidence of pancreatic necrosis.  Patient was evaluated by [facility]; with no plans for endoscopic intervention during this hospitalization.  He was initially treated with aggressive IV fluid hydration, and morphine PCA for pain control; in which PCA now has been discontinued. --Currently tolerating diet, no nausea/vomiting --oxycodone 10 mg p.o. q4h prn and morphine 4mg  IV q4hr prn for severe breakthrough pain --Continue IV antibiotics with Zosyn  Left lower lobe pneumonia Left pleural effusion CT abdomen/pelvis notable for left lower lobe consolidation. --completed 5-day course of azithromycin --Continue Zosyn --Continue antibiotics with azithromycin and Zosyn --s/p IR thoracentesis on 06/26/2019 with 1.3 L cloudy yellow fluid removed --Pleural fluid analysis, amylase 1574, glucose 84, LDH 165, WBC 5940, neutrophils 80, Gram stain WBC present, no organisms --Pleural fluid culture: Pending --Pending serum amylase, LDH --Continue furosemide 40 mg IV twice daily --Strict I's and O's and daily weights --If pleural fluid amylase greater than 5 times serum amylase, may need GI reconsult for consideration of ERCP and possible pancreatic duct stenting   DVT prophylaxis: Lovenox Code Status: Full code Family  Communication: None Disposition Plan: Continue inpatient hospitalization, anticipate  discharge home in 2-3 days   Consultants:   [facility] - signed off 8/4  IR  Procedures:   Paracentesis 06/26/2019 - 1.3L removed  Antimicrobials:   Zosyn  Azithromycin - completed 5-day course   Subjective:  Patient seen and examined at bedside following thoracentesis this morning.  Complains of some tenderness at thoracentesis site, otherwise breathing much improved.  Now titrated off of supplemental oxygen.  Awaiting further fluid analysis from pleural fluid studies.  Tolerating diet without nausea/vomiting.  Abdominal pain much improved.  No other complaints or concerns at this time.  Denies headache, no fever/chills/night sweats, no palpitations, no vomiting, no weakness, no issues with bowel/bladder function, no paresthesias.  No acute events overnight per nursing staff.  Objective: Vitals:   06/25/19 0620 06/25/19 1607 06/25/19 2139 06/26/19 0510  BP:  (!) 156/96 (!) 147/89 (!) 145/87  Pulse: (!) 108 (!) 102 (!) 119 (!) 114  Resp:  (!) 23 19 19   Temp:  98.5 F (36.9 C) 99.8 F (37.7 C) 98.7 F (37.1 C)  TempSrc:  Oral Oral Oral  SpO2:  99% 97% 98%  Weight:      Height:        Intake/Output Summary (Last 24 hours) at 06/26/2019 1507 Last data filed at 06/26/2019 1017 Gross per 24 hour  Intake 50 ml  Output 200 ml  Net -150 ml   Filed Weights   06/20/19 1910 06/21/19 0302  Weight: 68 kg 75 kg    Examination:  General exam: Appears calm and comfortable  Respiratory system: Breath sounds slightly decreased left base, otherwise clear to auscultation, normal respiratory effort, on room air Cardiovascular system: S1 & S2 heard, tachycardic, regular rhythm. No JVD, murmurs, rubs, gallops or clicks.  Trace bilateral pedal edema Gastrointestinal system: Abdomen is distended, mild generalized tenderness to palpation, No organomegaly or masses felt. Normal bowel sounds heard. Central nervous system: Alert and oriented. No focal neurological deficits. Extremities:  Symmetric 5 x 5 power. Skin: No rashes, lesions or ulcers Psychiatry: Judgement and insight appear normal. Mood & affect appropriate.     Data Reviewed: I have personally reviewed following labs and imaging studies  CBC: Recent Labs  Lab 06/20/19 1915  06/22/19 0200 06/23/19 0142 06/24/19 0818 06/25/19 0400 06/26/19 0353  WBC 29.6*   < > 16.3* 14.6* 14.8* 21.6* 21.2*  NEUTROABS 25.2*  --   --   --   --   --   --   HGB 14.6   < > 9.4* 8.6* 9.0* 10.4* 9.0*  HCT 44.5   < > 29.8* 27.3* 27.7* 32.8* 28.0*  MCV 87.1   < > 89.0 89.8 87.9 88.2 87.0  PLT 754*   < > 439* 432* 522* 696* 674*   < > = values in this interval not displayed.   Basic Metabolic Panel: Recent Labs  Lab 06/22/19 0200 06/23/19 0142 06/24/19 0158 06/25/19 0400 06/26/19 0353  NA 131* 132* 131* 130* 130*  K 3.9 3.9 4.5 3.5 3.7  CL 100 99 99 96* 92*  CO2 25 24 23 23 27   GLUCOSE 105* 90 114* 107* 87  BUN 9 7 6  <5* <5*  CREATININE 0.71 0.61 0.66 0.48* 0.68  CALCIUM 8.2* 8.0* 8.1* 8.0* 8.2*  MG 1.4* 1.7  --   --   --    GFR: Estimated Creatinine Clearance: 121.1 mL/min (by C-G formula based on SCr of 0.68 mg/dL). Liver Function Tests: Recent  Labs  Lab 06/20/19 1915 06/22/19 0200 06/23/19 0142 06/26/19 0353  AST 32 14* 16 28  ALT 50* 25 20 28   ALKPHOS 100 87 91 123  BILITOT 0.9 0.5 0.5 0.6  PROT 6.8 4.8* 4.8* 4.8*  ALBUMIN 2.5* 1.7* 1.5* 1.4*   Recent Labs  Lab 06/21/19 0110  06/23/19 0142 06/23/19 1136 06/24/19 0158 06/25/19 0400 06/26/19 0353  LIPASE 161*   < > 102* 93* 115* 212* 193*  AMYLASE 354*  --   --   --   --   --   --    < > = values in this interval not displayed.   No results for input(s): AMMONIA in the last 168 hours. Coagulation Profile: No results for input(s): INR, PROTIME in the last 168 hours. Cardiac Enzymes: No results for input(s): CKTOTAL, CKMB, CKMBINDEX, TROPONINI in the last 168 hours. BNP (last 3 results) No results for input(s): PROBNP in the last 8760  hours. HbA1C: No results for input(s): HGBA1C in the last 72 hours. CBG: No results for input(s): GLUCAP in the last 168 hours. Lipid Profile: No results for input(s): CHOL, HDL, LDLCALC, TRIG, CHOLHDL, LDLDIRECT in the last 72 hours. Thyroid Function Tests: No results for input(s): TSH, T4TOTAL, FREET4, T3FREE, THYROIDAB in the last 72 hours. Anemia Panel: No results for input(s): VITAMINB12, FOLATE, FERRITIN, TIBC, IRON, RETICCTPCT in the last 72 hours. Sepsis Labs: Recent Labs  Lab 06/20/19 1910 06/21/19 0106 06/26/19 0353  PROCALCITON  --   --  0.59  LATICACIDVEN 1.8 1.3 0.6    Recent Results (from the past 240 hour(s))  Blood Culture (routine x 2)     Status: None   Collection Time: 06/20/19  9:20 PM   Specimen: BLOOD RIGHT WRIST  Result Value Ref Range Status   Specimen Description BLOOD RIGHT WRIST  Final   Special Requests   Final    BOTTLES DRAWN AEROBIC AND ANAEROBIC Blood Culture results may not be optimal due to an inadequate volume of blood received in culture bottles   Culture   Final    NO GROWTH 5 DAYS Performed at [facility], [address] [place], [place] [address]    Report Status 06/25/2019 FINAL  Final  Blood Culture (routine x 2)     Status: None   Collection Time: 06/20/19  9:30 PM   Specimen: BLOOD RIGHT FOREARM  Result Value Ref Range Status   Specimen Description BLOOD RIGHT FOREARM  Final   Special Requests   Final    BOTTLES DRAWN AEROBIC AND ANAEROBIC Blood Culture adequate volume   Culture   Final    NO GROWTH 5 DAYS Performed at [facility], [address], [place], [place] [address]    Report Status 06/25/2019 FINAL  Final  SARS Coronavirus 2 [place] order, Performed in [facility] hospital lab) Nasopharyngeal Nasopharyngeal Swab     Status: None   Collection Time: 06/20/19  9:33 PM   Specimen: Nasopharyngeal Swab  Result Value Ref Range Status   SARS Coronavirus 2 NEGATIVE NEGATIVE Final    Comment: (NOTE) If  result is NEGATIVE SARS-CoV-2 target nucleic acids are NOT DETECTED. The SARS-CoV-2 RNA is generally detectable in upper and lower  respiratory specimens during the acute phase of infection. The lowest  concentration of SARS-CoV-2 viral copies this assay can detect is 250  copies / mL. A negative result does not preclude SARS-CoV-2 infection  and should not be used as the sole basis for treatment  or other  patient management decisions.  A negative result may occur with  improper specimen collection / handling, submission of specimen other  than nasopharyngeal swab, presence of viral mutation(s) within the  areas targeted by this assay, and inadequate number of viral copies  (<250 copies / mL). A negative result must be combined with clinical  observations, patient history, and epidemiological information. If result is POSITIVE SARS-CoV-2 target nucleic acids are DETECTED. The SARS-CoV-2 RNA is generally detectable in upper and lower  respiratory specimens dur ing the acute phase of infection.  Positive  results are indicative of active infection with SARS-CoV-2.  Clinical  correlation with patient history and other diagnostic information is  necessary to determine patient infection status.  Positive results do  not rule out bacterial infection or co-infection with other viruses. If result is PRESUMPTIVE POSTIVE SARS-CoV-2 nucleic acids MAY BE PRESENT.   A presumptive positive result was obtained on the submitted specimen  and confirmed on repeat testing.  While 2019 novel coronavirus  (SARS-CoV-2) nucleic acids may be present in the submitted sample  additional confirmatory testing may be necessary for epidemiological  and / or clinical management purposes  to differentiate between  SARS-CoV-2 and other Sarbecovirus currently known to infect humans.  If clinically indicated additional testing with an alternate test  methodology [phone]) is advised. The SARS-CoV-2 RNA is generally    detectable in upper and lower respiratory sp ecimens during the acute  phase of infection. The expected result is Negative. Fact Sheet for Patients:  BoilerBrush.com.cy Fact Sheet for Healthcare Providers: https://pope.com/ This test is not yet approved or cleared by the [place] FDA and has been authorized for detection and/or diagnosis of SARS-CoV-2 by FDA under an Emergency Use Authorization (EUA).  This EUA will remain in effect (meaning this test can be used) for the duration of the COVID-19 declaration under Section 564(b)(1) of the Act, 21 U.S.C. section 360bbb-3(b)(1), unless the authorization is terminated or revoked sooner. Performed at [facility], [address], [place], [place] [address]   Urine culture     Status: None   Collection Time: 06/21/19  3:42 AM   Specimen: Urine, Clean Catch  Result Value Ref Range Status   Specimen Description URINE, CLEAN CATCH  Final   Special Requests NONE  Final   Culture   Final    NO GROWTH Performed at [facility], [address] [place], [place] [address]    Report Status 06/22/2019 FINAL  Final  SARS CORONAVIRUS 2 Nasal Swab Aptima Multi Swab     Status: None   Collection Time: 06/25/19 11:00 AM   Specimen: Aptima Multi Swab; Nasal Swab  Result Value Ref Range Status   SARS Coronavirus 2 NEGATIVE NEGATIVE Final    Comment: (NOTE) SARS-CoV-2 target nucleic acids are NOT DETECTED. The SARS-CoV-2 RNA is generally detectable in upper and lower respiratory specimens during the acute phase of infection. Negative results do not preclude SARS-CoV-2 infection, do not rule out co-infections with other pathogens, and should not be used as the sole basis for treatment or other patient management decisions. Negative results must be combined with clinical observations, patient history, and epidemiological information. The expected result is Negative. Fact Sheet for  Patients: HairSlick.no Fact Sheet for Healthcare Providers: quierodirigir.com This test is not yet approved or cleared by the [place] FDA and  has been authorized for detection and/or diagnosis of SARS-CoV-2 by FDA under an Emergency Use Authorization (EUA). This EUA  will remain  in effect (meaning this test can be used) for the duration of the COVID-19 declaration under Section 56 4(b)(1) of the Act, 21 U.S.C. section 360bbb-3(b)(1), unless the authorization is terminated or revoked sooner. Performed at [facility], [address], [place], [place] [address]   Gram stain     Status: None   Collection Time: 06/26/19  9:19 AM   Specimen: Pleural, Right; Pleural Fluid  Result Value Ref Range Status   Specimen Description PLEURAL RIGHT  Final   Special Requests NONE  Final   Gram Stain   Final    CYTOSPIN SMEAR WBC PRESENT, PREDOMINANTLY PMN NO ORGANISMS SEEN Performed at [facility], [address] [place], [place] [address]    Report Status 06/26/2019 FINAL  Final         Radiology Studies: Dg Chest 1 View  Result Date: 06/26/2019 CLINICAL DATA:  Status post thoracentesis. EXAM: CHEST  1 VIEW COMPARISON:  06/24/2019 FINDINGS: Interval near complete evacuation of the left pleural fluid collection. A small residual effusion remains. No evidence of a postprocedural pneumothorax. Left basilar atelectasis is noted. The right lung is clear. No right-sided effusion. IMPRESSION: Interval near complete evacuation of the left pleural fluid collection with left lower lobe atelectasis. No postprocedural pneumothorax. Electronically Signed   By: Rudie Meyer M.D.   On: 06/26/2019 09:40   Ct Chest W Contrast  Result Date: 06/25/2019 CLINICAL DATA:  Shortness of breath, acute on chronic pancreatitis with multiple peripancreatic fluid collections, recurrent epigastric pain EXAM: CT CHEST, ABDOMEN, AND PELVIS WITH  CONTRAST TECHNIQUE: Multidetector CT imaging of the chest, abdomen and pelvis was performed following the standard protocol during bolus administration of intravenous contrast. 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 Normal heart size. No pericardial effusion. Mediastinum/Nodes: No enlarged mediastinal, hilar, or axillary lymph nodes. Thyroid gland, trachea, and esophagus demonstrate no significant findings. Lungs/Pleura: There is a large left pleural effusion with associated atelectasis or consolidation, enlarged compared to prior examination. There is a new, trace right pleural effusion. There are scattered subpleural ground-glass pulmonary opacities, most conspicuous in the right upper lobe. Musculoskeletal: No chest wall mass or suspicious bone lesions identified. CT ABDOMEN PELVIS FINDINGS Hepatobiliary: No focal liver abnormality is seen. Status post cholecystectomy. No biliary dilatation. Pancreas: Redemonstrated extensive retroperitoneal inflammation and multiple peripancreatic fluid collections. There is a new discrete fluid collection or component adjacent to the greater curvature of the stomach and spleen measuring approximately 6.2 x 2.7 cm (series 3, image 52). There is an additional new fluid collection within or adjacent to the omentum measuring 4.7 x 2.5 cm (series 3, image 75). There is an additional new fluid collection within the small bowel mesentery measuring 5.0 x 3.2 cm (series 3, image 86). Other fluid collections are not significantly changed, for example anterior to the pancreatic neck (series 3, image 67), adjacent to the porta hepatis (series 3, image 67), adjacent to the left kidney and splenic flexure (series 3, image 73), and posterior to the left kidney (series 3, image 65). Spleen:  Splenomegaly, maximum span 14.4 cm. Adrenals/Urinary Tract: Adrenal glands are unremarkable. Kidneys are normal, without renal calculi, solid lesion, or hydronephrosis. Bladder is unremarkable. Stomach/Bowel: Stomach is within normal limits. Appendix appears normal. Inflammatory thickening of the transverse colon (series 3, image 81), similar to prior examination, and of  the mid small bowel, particularly a segment in the anterior abdomen (series 3, image 95). Vascular/Lymphatic: Aortic atherosclerosis. No enlarged abdominal or pelvic lymph nodes. Reproductive: No mass or other abnormality. Other: Extensive anasarca. Small volume ascites, similar to prior examination. Musculoskeletal: No acute or significant osseous findings. IMPRESSION: 1. There is a large left pleural effusion with associated atelectasis or consolidation, enlarged compared to prior examination. There is a new, trace right pleural effusion. 2. There are scattered subpleural ground-glass pulmonary opacities, most conspicuous in the right upper lobe. These are nonspecific and infectious or inflammatory. 3. Redemonstrated extensive retroperitoneal inflammation and multiple peripancreatic fluid collections. There is a new discrete fluid collection or component adjacent to the greater curvature of the stomach and spleen measuring approximately 6.2 x 2.7 cm (series 3, image 52). There is an additional new fluid collection within or adjacent to the omentum measuring 4.7 x 2.5 cm (series 3, image 75). There is an additional new fluid collection within the small bowel mesentery measuring 5.0 x 3.2 cm (series 3, image 86). 4. Other fluid collections are not significantly changed, for example anterior to the pancreatic neck (series 3, image 67), adjacent to the porta hepatis (series 3, image 67), adjacent to the left kidney and splenic flexure (series 3, image 73), and posterior to the left kidney (series 3, image 65). 5. Inflammatory thickening of the  transverse colon (series 3, image 81), similar to prior examination, and of the mid small bowel, particularly a segment in the anterior abdomen (series 3, image 95). 6. Extensive anasarca. Small volume ascites, similar to prior examination. Electronically Signed   By: Lauralyn Primes M.D.   On: 06/25/2019 14:18   Ct Abdomen Pelvis W Contrast  Result Date: 06/25/2019 CLINICAL DATA:  Shortness of breath, acute on chronic pancreatitis with multiple peripancreatic fluid collections, recurrent epigastric pain EXAM: CT CHEST, ABDOMEN, AND PELVIS WITH CONTRAST TECHNIQUE: Multidetector CT imaging of the chest, abdomen and pelvis was performed following the standard protocol during bolus administration of intravenous contrast. 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 Normal heart size. No pericardial effusion. Mediastinum/Nodes: No enlarged mediastinal, hilar, or axillary lymph nodes. Thyroid gland, trachea, and esophagus demonstrate no significant findings. Lungs/Pleura: There is a large left pleural effusion with associated atelectasis or consolidation, enlarged compared to prior examination. There is a new, trace right pleural effusion. There are scattered subpleural ground-glass pulmonary opacities, most conspicuous in the right upper lobe. Musculoskeletal: No chest wall mass or suspicious bone lesions identified. CT ABDOMEN PELVIS FINDINGS Hepatobiliary: No focal liver abnormality is seen. Status post cholecystectomy. No biliary dilatation. Pancreas: Redemonstrated extensive retroperitoneal inflammation and multiple peripancreatic fluid collections. There is a new discrete fluid collection or component adjacent to the greater curvature of the stomach and spleen measuring approximately 6.2 x 2.7 cm (series 3, image 52). There is  an additional new fluid collection within or adjacent to the omentum measuring 4.7 x 2.5 cm (series 3, image 75). There is an additional new fluid collection within the small bowel mesentery measuring 5.0 x 3.2 cm (series 3, image 86). Other fluid collections are not significantly changed, for example anterior to the pancreatic neck (series 3, image 67), adjacent to the porta hepatis (series 3, image 67), adjacent to the left kidney and splenic flexure (series 3, image 73), and posterior to the left kidney (series 3, image 65). Spleen:  Splenomegaly, maximum span 14.4 cm. Adrenals/Urinary Tract: Adrenal glands are unremarkable. Kidneys are normal, without renal calculi, solid lesion, or hydronephrosis. Bladder is unremarkable. Stomach/Bowel: Stomach is within normal limits. Appendix appears normal. Inflammatory thickening of the transverse colon (series 3, image 81), similar to prior examination, and of the mid small bowel, particularly a segment in the anterior abdomen (series 3, image 95). Vascular/Lymphatic: Aortic atherosclerosis. No enlarged abdominal or pelvic lymph nodes. Reproductive: No mass or other abnormality. Other: Extensive anasarca. Small volume ascites, similar to prior examination. Musculoskeletal: No acute or significant osseous findings. IMPRESSION: 1. There is a large left pleural effusion with associated atelectasis or consolidation, enlarged compared to prior examination. There is a new, trace right pleural effusion. 2. There are scattered subpleural ground-glass pulmonary opacities, most conspicuous in the right upper lobe. These are nonspecific and infectious or inflammatory. 3. Redemonstrated extensive retroperitoneal inflammation and multiple peripancreatic fluid collections. There is a new discrete fluid collection or component adjacent to the greater curvature of the stomach and spleen measuring approximately 6.2 x 2.7 cm (series 3, image 52). There is an additional new fluid collection  within or adjacent to the omentum measuring 4.7 x 2.5 cm (series 3, image 75). There is an additional new fluid collection within the small bowel mesentery measuring 5.0 x 3.2 cm (series 3, image 86). 4. Other fluid collections are not significantly changed, for example anterior to the pancreatic neck (series 3, image 67), adjacent to the porta hepatis (series 3, image 67), adjacent to the left kidney and splenic flexure (series 3, image 73), and posterior to the left kidney (series 3, image 65). 5. Inflammatory thickening of the transverse colon (series 3, image 81), similar to prior examination, and of the mid small bowel, particularly a segment in the anterior abdomen (series 3, image 95). 6. Extensive anasarca. Small volume ascites, similar to prior examination. Electronically Signed   By: Lauralyn Primes M.D.   On: 06/25/2019 14:18   Dg Chest Port 1 View  Result Date: 06/24/2019 CLINICAL DATA:  Chest pain for 1 hour EXAM: PORTABLE CHEST 1 VIEW COMPARISON:  06/20/2019 FINDINGS: Left-sided pleural effusion is again seen and slightly enlarged when compare with the prior study. Likely underlying atelectasis/infiltrate is present in the left base. The right lung is clear. No bony abnormality is noted. IMPRESSION: Increasing left-sided pleural effusion. Electronically Signed   By: Alcide Clever M.D.   On: 06/24/2019 19:33   Ir Thoracentesis Asp Pleural Space W/img Guide  Result Date: 06/26/2019 INDICATION: Shortness of breath. Left-sided pleural effusion. Request for diagnostic and therapeutic thoracentesis. EXAM: ULTRASOUND GUIDED LEFT THORACENTESIS MEDICATIONS: None. COMPLICATIONS: None immediate. PROCEDURE: An ultrasound guided thoracentesis was thoroughly discussed with the patient and questions answered. The benefits, risks, alternatives and complications were also discussed. The patient understands and wishes to proceed with the procedure. Written consent was obtained. Ultrasound was performed to localize and  mark an adequate pocket of fluid in the left chest. The area was then prepped and draped in the normal sterile fashion. 1% Lidocaine was used for local anesthesia. Under ultrasound guidance a 6 Fr Safe-T-Centesis catheter was introduced. Thoracentesis was performed. The catheter was removed and a dressing applied. FINDINGS: A total of approximately 1.3 L of hazy yellow fluid was removed. Samples were sent to the laboratory as requested by the clinical team. IMPRESSION: Successful ultrasound guided left thoracentesis yielding 1.3 L of pleural fluid. Read by: Brayton El PA-C Electronically Signed   By: Richarda Overlie M.D.   On:  06/26/2019 09:19        Scheduled Meds:  baclofen  10 mg Oral TID   feeding supplement (PRO-STAT SUGAR FREE 64)  30 mL Oral TID BM   furosemide  40 mg Intravenous BID   lidocaine       metoprolol tartrate  12.5 mg Oral BID   multivitamin with minerals  1 tablet Oral Daily   pantoprazole  40 mg Oral Q0600   polyethylene glycol  17 g Oral BID   Continuous Infusions:  piperacillin-tazobactam (ZOSYN)  IV 3.375 g (06/26/19 1059)     LOS: 6 days    Time spent: 36 minutes spent on chart review, personally reviewing all imaging studies, labs, discussion with nursing staff, consultants, updating family and interview/physical exam; more than 50% of that time was spent in counseling and/or coordination of care.    Alvira Philips [place], DO [facility] Pager [phone]  If 7PM-7AM, please contact night-coverage [email or web address] Password [email or web address] 06/26/2019, 3:07 PM

## 2019-06-26 NOTE — Progress Notes (Signed)
Nutrition Follow-up  DOCUMENTATION CODES:   Not applicable  INTERVENTION:   -D/c Boost Breeze po TID, each supplement provides 250 kcal and 9 grams of protein -D/c Ensure Enlive po BID, each supplement provides 350 kcal and 20 grams of protein -30 ml Prostat TID, each supplement provides 100 kcals and 15 grams protein -Magic cup TID with meals, each supplement provides 290 kcal and 9 grams of protein -Hormel Shake TID with meals, each supplement provides 520 kcals and 22 grams protein -MVI with minerals daily  NUTRITION DIAGNOSIS:   Increased nutrient needs related to chronic illness(chronic pancreatitis) as evidenced by estimated needs.  Ongoing  GOAL:   Patient will meet greater than or equal to 90% of their needs  Progressing   MONITOR:   PO intake, Supplement acceptance, Weight trends, Labs, I & O's  REASON FOR ASSESSMENT:   Malnutrition Screening Tool    ASSESSMENT:   48 year old male with medical history significant of recurrent pancreatitis, 5 prior hospitalizations since May 2020 with most recent 7/14-7/23 at [facility] for idiopathic necrotizing pancreatitis. Patient admitted with recurrent/chronic pancreatitis, thrombocytosis, and left lower lob pneumonia.  8/4: Per GI, consider pt transfer to [facility] 8/3 - tolerating CL diet, abdominal pain improving, advanced to heart healthy diet 8/7- s/p lt thoracentesis (1.3 L hazy yellow fluid removed)   Reviewed I/O's: +1.3 L x 24 hours and +13 L since admission  UOP: 350 ml x 24 hours  Pt unavailable at time of visit.   Noted meal completions now 0% for the past 48 hours. Noted previous meal completion 75-100%. Pt is also refusing both Boost Breeze and Ensure supplements.   Per MD notes, plan to check CT of abdomen and pelvic to evaluate posisbility worsening peripancreatic fluid collections.   Medications reviewed and include miralax.   Labs reviewed: Na: 130.   Diet Order:   Diet Order            Diet Heart  Room service appropriate? Yes; Fluid consistency: Thin  Diet effective now              EDUCATION NEEDS:   No education needs have been identified at this time  Skin:  Skin Assessment: Reviewed RN Assessment  Last BM:  06/25/19  Height:   Ht Readings from Last 1 Encounters:  06/21/19 6\' 1"  (1.854 m)    Weight:   Wt Readings from Last 1 Encounters:  06/21/19 75 kg    Ideal Body Weight:  83.6 kg  BMI:  Body mass index is 21.81 kg/m.  Estimated Nutritional Needs:   Kcal:  2150-2350  Protein:  108-118  Fluid:  >2.1L    Shavone Nevers A. Jimmye Norman, RD, LDN, [place] Registered Dietitian II Certified Diabetes Care and Education Specialist Pager: [phone] After hours Pager: [phone]

## 2019-06-26 NOTE — Procedures (Signed)
PROCEDURE SUMMARY:  Successful US guided left thoracentesis. Yielded 1.3 L of hazy yellow fluid. Pt tolerated procedure well. No immediate complications.  Specimen was sent for labs. CXR ordered.  EBL < 5 mL  Ascencion Dike PA-C 06/26/2019 9:17 AM

## 2019-06-26 NOTE — Progress Notes (Signed)
Pt transfer to radiology, alert and oriented , no s/s of  Distress noted.

## 2019-06-27 LAB — TRIGLYCERIDES, BODY FLUIDS: Triglycerides, Fluid: 30 mg/dL

## 2019-06-27 LAB — BASIC METABOLIC PANEL
Anion gap: 8 (ref 5–15)
BUN: 6 mg/dL (ref 6–20)
CO2: 30 mmol/L (ref 22–32)
Calcium: 8 mg/dL — ABNORMAL LOW (ref 8.9–10.3)
Chloride: 90 mmol/L — ABNORMAL LOW (ref 98–111)
Creatinine, Ser: 0.65 mg/dL (ref 0.61–1.24)
GFR calc Af Amer: >60 mL/min (ref >60)
GFR calc non Af Amer: >60 mL/min (ref >60)
Glucose, Bld: 115 mg/dL — ABNORMAL HIGH (ref 70–99)
Potassium: 2.9 mmol/L — ABNORMAL LOW (ref 3.5–5.1)
Sodium: 128 mmol/L — ABNORMAL LOW (ref 135–145)

## 2019-06-27 LAB — PROCALCITONIN: Procalcitonin: 0.48 ng/mL

## 2019-06-27 LAB — LIPASE, BLOOD: Lipase: 315 U/L — ABNORMAL HIGH (ref 11–51)

## 2019-06-27 LAB — CBC
HCT: 28.2 % — ABNORMAL LOW (ref 39.0–52.0)
Hemoglobin: 9.1 g/dL — ABNORMAL LOW (ref 13.0–17.0)
MCH: 28.2 pg (ref 26.0–34.0)
MCHC: 32.3 g/dL (ref 30.0–36.0)
MCV: 87.3 fL (ref 80.0–100.0)
Platelets: 719 10*3/uL — ABNORMAL HIGH (ref 150–400)
RBC: 3.23 MIL/uL — ABNORMAL LOW (ref 4.22–5.81)
RDW: 13.3 % (ref 11.5–15.5)
WBC: 21 10*3/uL — ABNORMAL HIGH (ref 4.0–10.5)
nRBC: 0 % (ref 0.0–0.2)

## 2019-06-27 NOTE — Progress Notes (Signed)
PROGRESS NOTE    Kyle Mooney  WGN:[id number] DOB: 1971-08-19 DOA: 06/20/2019 PCP: Patient, No Pcp Per    Brief Narrative:   Kyle Mooney a 48 y.o.malewith medical history significant ofrecurrent pancreatitis. He has had 5 prior hospitalizations since May 2020. Most recent hospitalization was at [facility] 7/14 to 06/11/2019 for a diagnosis of idiopathic necrotizing pancreatitis. The patient was seen by GI and surgical services. He had MRCP which confirmed his necrotizing pancreatitis and also revealed multiple fluid collections in the peripancreatic region and throughout the abdomen. He was not felt to be a candidate for a bile duct stent. Patient during his hospitalization had a thrombocytosis which was thought to be reactive. He had a leukocytosis also thought to be reactive. Since being home the patient has continued to have abdominal discomfort. He has had poor p.o. intake. The day of admission he had a sudden increase in pain in the left flank and left lower back. His abdominal pain also became more intense. He was discharged from [facility] with a prescription for oxycodone and he did take this today.   Patient called EMS and asked to be taken to [facility] but he was taken instead to [facility]. He reports that his pain is severe at a 10/10 particularly in the left upper quadrant of the abdomen. Does admit to having had a cough productive of a grayish-greenish sputum.He denies any frank shortness of breath.  Patient was admitted for recurrent acute on chronic pancreatitis with recent hospitalization at [facility] where he was noted to have necrotizing pancreatitis. He has also had recent MRCP and placement of a bile duct stent was discussed at his last hospitalization, but was not performed.    Assessment & Plan:   Active Problems:   Chronic pancreatitis (HCC)   Thrombocytosis (HCC)   LLL pneumonia (HCC)   Recurrent left pleural effusion  Pancreatitis, recurrent   Acute on chronic pancreatitis (HCC)   Constipation   Acute on chronic pancreatitis with multiple peripancreatic fluid collections Patient presenting with recurrent epigastric pain likely secondary to from his idiopathic pancreatitis.  He is followed by digestive health at [facility], Dr. Carolynn Comment.  Lipase on admission noted to be elevated to 129.  CT abdomen/pelvis with contrast on admission notable for multiple peripancreatic fluid collections largest measuring 3.7 x 1.8 cm with enhancement of the pancreas, small volume free fluid in the pelvis, no evidence of pancreatic necrosis.  Patient was evaluated by [facility]; with no plans for endoscopic intervention during this hospitalization.  He was initially treated with aggressive IV fluid hydration, and morphine PCA for pain control; in which PCA now has been discontinued. --Currently tolerating diet, no nausea/vomiting --oxycodone 10 mg p.o. q4h prn and morphine 4mg  IV q4hr prn for severe breakthrough pain --Continue IV antibiotics with Zosyn  Left lower lobe pneumonia Left exudative pleural effusion likely secondary to acute pancreatitis CT abdomen/pelvis notable for left lower lobe consolidation.  Underwent IR thoracentesis on 06/26/2019 with 1.3 L of cloudy yellow fluid removed.  Pleural fluid analysis, amylase 1574, glucose 84, LDH 165; serum amylase 433, serum LDH 158 with positive lights criteria consistent with exudative effusion likely secondary to acute pancreatitis.  Pleural amylase to serum amylase ratio = 3.6; which is not consistent with a pancreatic pleural fistula. --Pleural fluid culture: No growth < 24h --completed 5-day course of azithromycin --Continue Zosyn --Continue furosemide 40 mg IV twice daily --Strict I's and O's and daily weights  DVT prophylaxis: Lovenox  Code Status: Full code Family Communication: None Disposition Plan: Continue inpatient hospitalization,  anticipate discharge home in 1-2 days   Consultants:   [facility] - signed off 8/4  IR  Procedures:   Paracentesis 06/26/2019 - 1.3L removed  Antimicrobials:   Zosyn  Azithromycin - completed 5-day course   Subjective:  Patient seen and examined at bedside, sitting in bedside chair.  Tolerating diet.  Complains of some mild tenderness to paracentesis site.  Continues on room air, denies shortness of breath.  No other complaints or concerns at this time.  Denies headache, no fever/chills/night sweats, no palpitations, no vomiting, no weakness, no issues with bowel/bladder function, no paresthesias.  No acute events overnight per nursing staff.  Objective: Vitals:   06/26/19 0510 06/26/19 1600 06/26/19 2137 06/27/19 0438  BP: (!) 145/87 140/84 140/77 136/85  Pulse: (!) 114 (!) 110 (!) 102 (!) 102  Resp: 19 19 18 18   Temp: 98.7 F (37.1 C) 98.6 F (37 C) 99.5 F (37.5 C) 98.9 F (37.2 C)  TempSrc: Oral Oral Oral Oral  SpO2: 98% 90% 92% 93%  Weight:      Height:        Intake/Output Summary (Last 24 hours) at 06/27/2019 1327 Last data filed at 06/27/2019 0434 Gross per 24 hour  Intake 560 ml  Output 1700 ml  Net -1140 ml   Filed Weights   06/20/19 1910 06/21/19 0302  Weight: 68 kg 75 kg    Examination:  General exam: Appears calm and comfortable  Respiratory system: Breath sounds slightly decreased bilateral bases, otherwise clear to auscultation, normal respiratory effort, on room air Cardiovascular system: S1 & S2 heard, tachycardic, regular rhythm. No JVD, murmurs, rubs, gallops or clicks.  Trace bilateral pedal edema Gastrointestinal system: Abdomen is distended, mild generalized tenderness to palpation, No organomegaly or masses felt. Normal bowel sounds heard. Central nervous system: Alert and oriented. No focal neurological deficits. Extremities: Symmetric 5 x 5 power. Skin: No rashes, lesions or ulcers Psychiatry: Judgement and insight appear normal. Mood &  affect appropriate.     Data Reviewed: I have personally reviewed following labs and imaging studies  CBC: Recent Labs  Lab 06/20/19 1915  06/23/19 0142 06/24/19 0818 06/25/19 0400 06/26/19 0353 06/27/19 0324  WBC 29.6*   < > 14.6* 14.8* 21.6* 21.2* 21.0*  NEUTROABS 25.2*  --   --   --   --   --   --   HGB 14.6   < > 8.6* 9.0* 10.4* 9.0* 9.1*  HCT 44.5   < > 27.3* 27.7* 32.8* 28.0* 28.2*  MCV 87.1   < > 89.8 87.9 88.2 87.0 87.3  PLT 754*   < > 432* 522* 696* 674* 719*   < > = values in this interval not displayed.   Basic Metabolic Panel: Recent Labs  Lab 06/22/19 0200 06/23/19 0142 06/24/19 0158 06/25/19 0400 06/26/19 0353 06/27/19 0324  NA 131* 132* 131* 130* 130* 128*  K 3.9 3.9 4.5 3.5 3.7 2.9*  CL 100 99 99 96* 92* 90*  CO2 25 24 23 23 27 30   GLUCOSE 105* 90 114* 107* 87 115*  BUN 9 7 6  <5* <5* 6  CREATININE 0.71 0.61 0.66 0.48* 0.68 0.65  CALCIUM 8.2* 8.0* 8.1* 8.0* 8.2* 8.0*  MG 1.4* 1.7  --   --   --   --    GFR: Estimated Creatinine Clearance: 121.1 mL/min (by C-G formula based on SCr of 0.65 mg/dL). Liver Function Tests:  Recent Labs  Lab 06/20/19 1915 06/22/19 0200 06/23/19 0142 06/26/19 0353  AST 32 14* 16 28  ALT 50* 25 20 28   ALKPHOS 100 87 91 123  BILITOT 0.9 0.5 0.5 0.6  PROT 6.8 4.8* 4.8* 4.8*  ALBUMIN 2.5* 1.7* 1.5* 1.4*   Recent Labs  Lab 06/21/19 0110  06/23/19 1136 06/24/19 0158 06/25/19 0400 06/26/19 0353 06/26/19 1551 06/27/19 0324  LIPASE 161*   < > 93* 115* 212* 193*  --  315*  AMYLASE 354*  --   --   --   --   --  433*  --    < > = values in this interval not displayed.   No results for input(s): AMMONIA in the last 168 hours. Coagulation Profile: No results for input(s): INR, PROTIME in the last 168 hours. Cardiac Enzymes: No results for input(s): CKTOTAL, CKMB, CKMBINDEX, TROPONINI in the last 168 hours. BNP (last 3 results) No results for input(s): PROBNP in the last 8760 hours. HbA1C: No results for input(s):  HGBA1C in the last 72 hours. CBG: No results for input(s): GLUCAP in the last 168 hours. Lipid Profile: No results for input(s): CHOL, HDL, LDLCALC, TRIG, CHOLHDL, LDLDIRECT in the last 72 hours. Thyroid Function Tests: No results for input(s): TSH, T4TOTAL, FREET4, T3FREE, THYROIDAB in the last 72 hours. Anemia Panel: No results for input(s): VITAMINB12, FOLATE, FERRITIN, TIBC, IRON, RETICCTPCT in the last 72 hours. Sepsis Labs: Recent Labs  Lab 06/20/19 1910 06/21/19 0106 06/26/19 0353 06/27/19 0324  PROCALCITON  --   --  0.59 0.48  LATICACIDVEN 1.8 1.3 0.6  --     Recent Results (from the past 240 hour(s))  Blood Culture (routine x 2)     Status: None   Collection Time: 06/20/19  9:20 PM   Specimen: BLOOD RIGHT WRIST  Result Value Ref Range Status   Specimen Description BLOOD RIGHT WRIST  Final   Special Requests   Final    BOTTLES DRAWN AEROBIC AND ANAEROBIC Blood Culture results may not be optimal due to an inadequate volume of blood received in culture bottles   Culture   Final    NO GROWTH 5 DAYS Performed at [facility], [address], [place], [place] [address]    Report Status 06/25/2019 FINAL  Final  Blood Culture (routine x 2)     Status: None   Collection Time: 06/20/19  9:30 PM   Specimen: BLOOD RIGHT FOREARM  Result Value Ref Range Status   Specimen Description BLOOD RIGHT FOREARM  Final   Special Requests   Final    BOTTLES DRAWN AEROBIC AND ANAEROBIC Blood Culture adequate volume   Culture   Final    NO GROWTH 5 DAYS Performed at [facility], [address], [place], [place] [address]    Report Status 06/25/2019 FINAL  Final  SARS Coronavirus 2 [facility] order, Performed in [facility] hospital lab) Nasopharyngeal Nasopharyngeal Swab     Status: None   Collection Time: 06/20/19  9:33 PM   Specimen: Nasopharyngeal Swab  Result Value Ref Range Status   SARS Coronavirus 2 NEGATIVE NEGATIVE Final    Comment: (NOTE) If result is  NEGATIVE SARS-CoV-2 target nucleic acids are NOT DETECTED. The SARS-CoV-2 RNA is generally detectable in upper and lower  respiratory specimens during the acute phase of infection. The lowest  concentration of SARS-CoV-2 viral copies this assay can detect is 250  copies / mL. A negative result does not preclude SARS-CoV-2  infection  and should not be used as the sole basis for treatment or other  patient management decisions.  A negative result may occur with  improper specimen collection / handling, submission of specimen other  than nasopharyngeal swab, presence of viral mutation(s) within the  areas targeted by this assay, and inadequate number of viral copies  (<250 copies / mL). A negative result must be combined with clinical  observations, patient history, and epidemiological information. If result is POSITIVE SARS-CoV-2 target nucleic acids are DETECTED. The SARS-CoV-2 RNA is generally detectable in upper and lower  respiratory specimens dur ing the acute phase of infection.  Positive  results are indicative of active infection with SARS-CoV-2.  Clinical  correlation with patient history and other diagnostic information is  necessary to determine patient infection status.  Positive results do  not rule out bacterial infection or co-infection with other viruses. If result is PRESUMPTIVE POSTIVE SARS-CoV-2 nucleic acids MAY BE PRESENT.   A presumptive positive result was obtained on the submitted specimen  and confirmed on repeat testing.  While 2019 novel coronavirus  (SARS-CoV-2) nucleic acids may be present in the submitted sample  additional confirmatory testing may be necessary for epidemiological  and / or clinical management purposes  to differentiate between  SARS-CoV-2 and other Sarbecovirus currently known to infect humans.  If clinically indicated additional testing with an alternate test  methodology [phone]) is advised. The SARS-CoV-2 RNA is generally  detectable  in upper and lower respiratory sp ecimens during the acute  phase of infection. The expected result is Negative. Fact Sheet for Patients:  BoilerBrush.com.cy Fact Sheet for Healthcare Providers: https://pope.com/ This test is not yet approved or cleared by the [place] FDA and has been authorized for detection and/or diagnosis of SARS-CoV-2 by FDA under an Emergency Use Authorization (EUA).  This EUA will remain in effect (meaning this test can be used) for the duration of the COVID-19 declaration under Section 564(b)(1) of the Act, 21 U.S.C. section 360bbb-3(b)(1), unless the authorization is terminated or revoked sooner. Performed at [facility], [address], [place], [place] [address]   Urine culture     Status: None   Collection Time: 06/21/19  3:42 AM   Specimen: Urine, Clean Catch  Result Value Ref Range Status   Specimen Description URINE, CLEAN CATCH  Final   Special Requests NONE  Final   Culture   Final    NO GROWTH Performed at [facility], [address], [place], [place] [address]    Report Status 06/22/2019 FINAL  Final  SARS CORONAVIRUS 2 Nasal Swab Aptima Multi Swab     Status: None   Collection Time: 06/25/19 11:00 AM   Specimen: Aptima Multi Swab; Nasal Swab  Result Value Ref Range Status   SARS Coronavirus 2 NEGATIVE NEGATIVE Final    Comment: (NOTE) SARS-CoV-2 target nucleic acids are NOT DETECTED. The SARS-CoV-2 RNA is generally detectable in upper and lower respiratory specimens during the acute phase of infection. Negative results do not preclude SARS-CoV-2 infection, do not rule out co-infections with other pathogens, and should not be used as the sole basis for treatment or other patient management decisions. Negative results must be combined with clinical observations, patient history, and epidemiological information. The expected result is Negative. Fact Sheet for  Patients: HairSlick.no Fact Sheet for Healthcare Providers: quierodirigir.com This test is not yet approved or cleared by the [place] FDA and  has been authorized for detection and/or diagnosis  of SARS-CoV-2 by FDA under an Emergency Use Authorization (EUA). This EUA will remain  in effect (meaning this test can be used) for the duration of the COVID-19 declaration under Section 56 4(b)(1) of the Act, 21 U.S.C. section 360bbb-3(b)(1), unless the authorization is terminated or revoked sooner. Performed at [facility], [address], [place], [place] [address]   Gram stain     Status: None   Collection Time: 06/26/19  9:19 AM   Specimen: Pleural, Right; Pleural Fluid  Result Value Ref Range Status   Specimen Description PLEURAL RIGHT  Final   Special Requests NONE  Final   Gram Stain   Final    CYTOSPIN SMEAR WBC PRESENT, PREDOMINANTLY PMN NO ORGANISMS SEEN Performed at [facility], [address] [place], [place] [address]    Report Status 06/26/2019 FINAL  Final  Culture, body fluid-bottle     Status: None (Preliminary result)   Collection Time: 06/26/19  9:19 AM   Specimen: Pleura  Result Value Ref Range Status   Specimen Description PLEURAL RIGHT  Final   Special Requests BOTTLES DRAWN AEROBIC AND ANAEROBIC  Final   Culture   Final    NO GROWTH < 24 HOURS Performed at [facility], [address] [place], [place] [address]    Report Status PENDING  Incomplete         Radiology Studies: Dg Chest 1 View  Result Date: 06/26/2019 CLINICAL DATA:  Status post thoracentesis. EXAM: CHEST  1 VIEW COMPARISON:  06/24/2019 FINDINGS: Interval near complete evacuation of the left pleural fluid collection. A small residual effusion remains. No evidence of a postprocedural pneumothorax. Left basilar atelectasis is noted. The right lung is clear. No right-sided effusion. IMPRESSION: Interval near  complete evacuation of the left pleural fluid collection with left lower lobe atelectasis. No postprocedural pneumothorax. Electronically Signed   By: Rudie Meyer M.D.   On: 06/26/2019 09:40   Ct Chest W Contrast  Result Date: 06/25/2019 CLINICAL DATA:  Shortness of breath, acute on chronic pancreatitis with multiple peripancreatic fluid collections, recurrent epigastric pain EXAM: CT CHEST, ABDOMEN, AND PELVIS WITH CONTRAST TECHNIQUE: Multidetector CT imaging of the chest, abdomen and pelvis was performed following the standard protocol during bolus administration of intravenous contrast. 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 Normal heart size. No pericardial effusion. Mediastinum/Nodes: No enlarged mediastinal, hilar, or axillary lymph nodes. Thyroid gland, trachea, and esophagus demonstrate no significant findings. Lungs/Pleura: There is a large left pleural effusion with associated atelectasis or consolidation, enlarged compared to prior examination. There is a new, trace right pleural effusion. There are scattered subpleural ground-glass pulmonary opacities, most conspicuous in the right upper lobe. Musculoskeletal: No chest wall mass or suspicious bone lesions identified. CT ABDOMEN PELVIS FINDINGS Hepatobiliary: No focal liver abnormality is seen. Status post cholecystectomy. No biliary dilatation. Pancreas: Redemonstrated extensive retroperitoneal inflammation and multiple peripancreatic fluid collections. There is a new discrete fluid collection or component adjacent to the greater curvature of the stomach and spleen measuring approximately 6.2 x 2.7 cm (series 3, image 52). There is an additional new fluid collection within or adjacent to the omentum measuring 4.7 x 2.5 cm (series 3, image 75). There is an  additional new fluid collection within the small bowel mesentery measuring 5.0 x 3.2 cm (series 3, image 86). Other fluid collections  are not significantly changed, for example anterior to the pancreatic neck (series 3, image 67), adjacent to the porta hepatis (series 3, image 67), adjacent to the left kidney and splenic flexure (series 3, image 73), and posterior to the left kidney (series 3, image 65). Spleen: Splenomegaly, maximum span 14.4 cm. Adrenals/Urinary Tract: Adrenal glands are unremarkable. Kidneys are normal, without renal calculi, solid lesion, or hydronephrosis. Bladder is unremarkable. Stomach/Bowel: Stomach is within normal limits. Appendix appears normal. Inflammatory thickening of the transverse colon (series 3, image 81), similar to prior examination, and of the mid small bowel, particularly a segment in the anterior abdomen (series 3, image 95). Vascular/Lymphatic: Aortic atherosclerosis. No enlarged abdominal or pelvic lymph nodes. Reproductive: No mass or other abnormality. Other: Extensive anasarca. Small volume ascites, similar to prior examination. Musculoskeletal: No acute or significant osseous findings. IMPRESSION: 1. There is a large left pleural effusion with associated atelectasis or consolidation, enlarged compared to prior examination. There is a new, trace right pleural effusion. 2. There are scattered subpleural ground-glass pulmonary opacities, most conspicuous in the right upper lobe. These are nonspecific and infectious or inflammatory. 3. Redemonstrated extensive retroperitoneal inflammation and multiple peripancreatic fluid collections. There is a new discrete fluid collection or component adjacent to the greater curvature of the stomach and spleen measuring approximately 6.2 x 2.7 cm (series 3, image 52). There is an additional new fluid collection within or adjacent to the omentum measuring 4.7 x 2.5 cm (series 3, image 75). There is an additional new fluid collection  within the small bowel mesentery measuring 5.0 x 3.2 cm (series 3, image 86). 4. Other fluid collections are not significantly changed, for example anterior to the pancreatic neck (series 3, image 67), adjacent to the porta hepatis (series 3, image 67), adjacent to the left kidney and splenic flexure (series 3, image 73), and posterior to the left kidney (series 3, image 65). 5. Inflammatory thickening of the transverse colon (series 3, image 81), similar to prior examination, and of the mid small bowel, particularly a segment in the anterior abdomen (series 3, image 95). 6. Extensive anasarca. Small volume ascites, similar to prior examination. Electronically Signed   By: Lauralyn Primes M.D.   On: 06/25/2019 14:18   Ct Abdomen Pelvis W Contrast  Result Date: 06/25/2019 CLINICAL DATA:  Shortness of breath, acute on chronic pancreatitis with multiple peripancreatic fluid collections, recurrent epigastric pain EXAM: CT CHEST, ABDOMEN, AND PELVIS WITH CONTRAST TECHNIQUE: Multidetector CT imaging of the chest, abdomen and pelvis was performed following the standard protocol during bolus administration of intravenous contrast. 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 Normal heart size. No pericardial effusion. Mediastinum/Nodes: No enlarged mediastinal, hilar, or axillary lymph nodes. Thyroid gland, trachea, and esophagus demonstrate no significant findings. Lungs/Pleura: There is a large left pleural effusion with associated atelectasis or consolidation, enlarged compared to prior examination. There is a new, trace right pleural effusion. There are scattered subpleural ground-glass pulmonary opacities, most conspicuous in the right upper lobe. Musculoskeletal: No chest wall mass or suspicious bone lesions identified. CT ABDOMEN  PELVIS FINDINGS Hepatobiliary: No focal liver abnormality is seen. Status post cholecystectomy. No biliary dilatation. Pancreas: Redemonstrated extensive retroperitoneal inflammation and multiple peripancreatic fluid collections. There is a new discrete fluid collection or component adjacent to the greater curvature of the stomach and spleen measuring approximately 6.2 x 2.7 cm (series  3, image 52). There is an additional new fluid collection within or adjacent to the omentum measuring 4.7 x 2.5 cm (series 3, image 75). There is an additional new fluid collection within the small bowel mesentery measuring 5.0 x 3.2 cm (series 3, image 86). Other fluid collections are not significantly changed, for example anterior to the pancreatic neck (series 3, image 67), adjacent to the porta hepatis (series 3, image 67), adjacent to the left kidney and splenic flexure (series 3, image 73), and posterior to the left kidney (series 3, image 65). Spleen: Splenomegaly, maximum span 14.4 cm. Adrenals/Urinary Tract: Adrenal glands are unremarkable. Kidneys are normal, without renal calculi, solid lesion, or hydronephrosis. Bladder is unremarkable. Stomach/Bowel: Stomach is within normal limits. Appendix appears normal. Inflammatory thickening of the transverse colon (series 3, image 81), similar to prior examination, and of the mid small bowel, particularly a segment in the anterior abdomen (series 3, image 95). Vascular/Lymphatic: Aortic atherosclerosis. No enlarged abdominal or pelvic lymph nodes. Reproductive: No mass or other abnormality. Other: Extensive anasarca. Small volume ascites, similar to prior examination. Musculoskeletal: No acute or significant osseous findings. IMPRESSION: 1. There is a large left pleural effusion with associated atelectasis or consolidation, enlarged compared to prior examination. There is a new, trace right pleural effusion. 2. There are scattered subpleural ground-glass pulmonary opacities, most  conspicuous in the right upper lobe. These are nonspecific and infectious or inflammatory. 3. Redemonstrated extensive retroperitoneal inflammation and multiple peripancreatic fluid collections. There is a new discrete fluid collection or component adjacent to the greater curvature of the stomach and spleen measuring approximately 6.2 x 2.7 cm (series 3, image 52). There is an additional new fluid collection within or adjacent to the omentum measuring 4.7 x 2.5 cm (series 3, image 75). There is an additional new fluid collection within the small bowel mesentery measuring 5.0 x 3.2 cm (series 3, image 86). 4. Other fluid collections are not significantly changed, for example anterior to the pancreatic neck (series 3, image 67), adjacent to the porta hepatis (series 3, image 67), adjacent to the left kidney and splenic flexure (series 3, image 73), and posterior to the left kidney (series 3, image 65). 5. Inflammatory thickening of the transverse colon (series 3, image 81), similar to prior examination, and of the mid small bowel, particularly a segment in the anterior abdomen (series 3, image 95). 6. Extensive anasarca. Small volume ascites, similar to prior examination. Electronically Signed   By: Lauralyn Primes M.D.   On: 06/25/2019 14:18   Ir Thoracentesis Asp Pleural Space W/img Guide  Result Date: 06/26/2019 INDICATION: Shortness of breath. Left-sided pleural effusion. Request for diagnostic and therapeutic thoracentesis. EXAM: ULTRASOUND GUIDED LEFT THORACENTESIS MEDICATIONS: None. COMPLICATIONS: None immediate. PROCEDURE: An ultrasound guided thoracentesis was thoroughly discussed with the patient and questions answered. The benefits, risks, alternatives and complications were also discussed. The patient understands and wishes to proceed with the procedure. Written consent was obtained. Ultrasound was performed to localize and mark an adequate pocket of fluid in the left chest. The area was then prepped and  draped in the normal sterile fashion. 1% Lidocaine was used for local anesthesia. Under ultrasound guidance a 6 Fr Safe-T-Centesis catheter was introduced. Thoracentesis was performed. The catheter was removed and a dressing applied. FINDINGS: A total of approximately 1.3 L of hazy yellow fluid was removed. Samples were sent to the laboratory as requested by the clinical team. IMPRESSION: Successful ultrasound guided left thoracentesis yielding 1.3 L of pleural fluid. Read by:  Brayton El PA-C Electronically Signed   By: Richarda Overlie M.D.   On: 06/26/2019 09:19        Scheduled Meds:  baclofen  10 mg Oral TID   enoxaparin (LOVENOX) injection  40 mg Subcutaneous Q24H   feeding supplement (PRO-STAT SUGAR FREE 64)  30 mL Oral TID BM   furosemide  40 mg Intravenous BID   metoprolol tartrate  12.5 mg Oral BID   multivitamin with minerals  1 tablet Oral Daily   pantoprazole  40 mg Oral Q0600   polyethylene glycol  17 g Oral BID   Continuous Infusions:  piperacillin-tazobactam (ZOSYN)  IV 3.375 g (06/27/19 1240)     LOS: 7 days    Time spent: 35 minutes spent on chart review, personally reviewing all imaging studies, labs, discussion with nursing staff, consultants, updating family and interview/physical exam; more than 50% of that time was spent in counseling and/or coordination of care.    Alvira Philips [place], DO [facility] Pager [phone]  If 7PM-7AM, please contact night-coverage [email or web address] Password [email or web address] 06/27/2019, 1:27 PM

## 2019-06-28 DIAGNOSIS — I10 Essential (primary) hypertension: Secondary | ICD-10-CM

## 2019-06-28 LAB — BASIC METABOLIC PANEL
Anion gap: 13 (ref 5–15)
BUN: 8 mg/dL (ref 6–20)
CO2: 32 mmol/L (ref 22–32)
Calcium: 8.3 mg/dL — ABNORMAL LOW (ref 8.9–10.3)
Chloride: 86 mmol/L — ABNORMAL LOW (ref 98–111)
Creatinine, Ser: 0.67 mg/dL (ref 0.61–1.24)
GFR calc Af Amer: >60 mL/min (ref >60)
GFR calc non Af Amer: >60 mL/min (ref >60)
Glucose, Bld: 92 mg/dL (ref 70–99)
Potassium: 3 mmol/L — ABNORMAL LOW (ref 3.5–5.1)
Sodium: 131 mmol/L — ABNORMAL LOW (ref 135–145)

## 2019-06-28 LAB — CBC
HCT: 28.8 % — ABNORMAL LOW (ref 39.0–52.0)
Hemoglobin: 9.2 g/dL — ABNORMAL LOW (ref 13.0–17.0)
MCH: 27.7 pg (ref 26.0–34.0)
MCHC: 31.9 g/dL (ref 30.0–36.0)
MCV: 86.7 fL (ref 80.0–100.0)
Platelets: 751 10*3/uL — ABNORMAL HIGH (ref 150–400)
RBC: 3.32 MIL/uL — ABNORMAL LOW (ref 4.22–5.81)
RDW: 13.5 % (ref 11.5–15.5)
WBC: 22.8 10*3/uL — ABNORMAL HIGH (ref 4.0–10.5)
nRBC: 0 % (ref 0.0–0.2)

## 2019-06-28 LAB — PH, BODY FLUID: pH, Body Fluid: 7.5

## 2019-06-28 MED ORDER — FUROSEMIDE 40 MG PO TABS: 40.0000 mg | ORAL_TABLET | Freq: Every day | ORAL | 0 refills | Status: AC

## 2019-06-28 MED ORDER — ONDANSETRON HCL 4 MG PO TABS: 4.0000 mg | ORAL_TABLET | Freq: Every day | ORAL | 1 refills | Status: AC | PRN

## 2019-06-28 MED ORDER — AMOXICILLIN-POT CLAVULANATE 875-125 MG PO TABS: 1.0000 | ORAL_TABLET | Freq: Two times a day (BID) | ORAL | 0 refills | Status: DC

## 2019-06-28 MED ORDER — METOPROLOL TARTRATE 25 MG PO TABS: 25.0000 mg | ORAL_TABLET | Freq: Two times a day (BID) | ORAL | 2 refills | Status: AC

## 2019-06-28 MED ORDER — POTASSIUM CHLORIDE CRYS ER 20 MEQ PO TBCR
40.0000 meq | EXTENDED_RELEASE_TABLET | ORAL | Status: AC
  Administered 2019-06-28 (×2): 40 meq via ORAL
  Filled 2019-06-28 (×2): qty 2

## 2019-06-28 MED ORDER — POTASSIUM CHLORIDE ER 10 MEQ PO TBCR: 10.0000 meq | EXTENDED_RELEASE_TABLET | Freq: Every day | ORAL | 0 refills | Status: DC

## 2019-06-28 MED ORDER — OXYCODONE HCL 10 MG PO TABS: 10.0000 mg | ORAL_TABLET | ORAL | 0 refills | Status: DC | PRN

## 2019-06-28 NOTE — Plan of Care (Signed)
  Problem: Education: Goal: Knowledge of Pancreatitis treatment and prevention will improve Outcome: Progressing   Problem: Nutritional: Goal: Ability to achieve adequate nutritional intake will improve Outcome: Progressing   Problem: Pain Managment: Goal: General experience of comfort will improve Outcome: Progressing   Problem: Safety: Goal: Ability to remain free from injury will improve Outcome: Progressing

## 2019-06-28 NOTE — Discharge Summary (Signed)
Physician Discharge Summary  Adean Blomme ZOX:[id number] DOB: 11-10-1971 DOA: 06/20/2019  PCP: Patient, No Pcp Per  Admit date: 06/20/2019 Discharge date: 06/28/2019  Admitted From: Home Disposition:  Home  Recommendations for Outpatient Follow-up:  1. Follow up with PCP in 1-2 weeks 2. Follow-up with your primary gastroenterologist, Dr. Briant Sites at [facility] in 2 weeks 3. Please obtain CMP/CBC in one week 4. Follow-up finalization of pleural fluid culture  Home Health: No Equipment/Devices: None  Discharge Condition:  CODE STATUS: Full code Diet recommendation: Heart healthy, low-fat diet; avoid alcohol; small more frequent meals  History of present illness:  Anthonie Schiesser a 48 y.o.malewith medical history significant ofrecurrent pancreatitis. He has had 5 prior hospitalizations since May 2020. Most recent hospitalization was at [facility] 7/14 to 06/11/2019 for a diagnosis of idiopathic necrotizing pancreatitis. The patient was seen by GI and surgical services. He had MRCP which confirmed his necrotizing pancreatitis and also revealed multiple fluid collections in the peripancreatic region and throughout the abdomen. He was not felt to be a candidate for a bile duct stent. Patient during his hospitalization had a thrombocytosis which was thought to be reactive. He had a leukocytosis also thought to be reactive. Since being home the patient has continued to have abdominal discomfort. He has had poor p.o. intake. The day of admission he had a sudden increase in pain in the left flank and left lower back. His abdominal pain also became more intense. He was discharged from [facility] with a prescription for oxycodone and he did take this today.   Patient called EMS and asked to be taken to [facility] but he was taken instead to [facility]. He reports that his pain is severe at a 10/10 particularly in the left upper quadrant of the abdomen. Does admit to having had a  cough productive of a grayish-greenish sputum.He denies any frank shortness of breath.  Patient was admitted for recurrent acute on chronic pancreatitis with recent hospitalization at [facility] where he was noted to have necrotizing pancreatitis. He has also had recent MRCP and placement of a bile duct stent was discussed at his last hospitalization, but was not performed.   Hospital course:  Acute on chronic pancreatitis with multiple peripancreatic fluid collections Patient presenting with recurrent epigastric pain likely secondary to from his idiopathic pancreatitis.  He is followed by digestive health at [facility], Dr. Briant Sites, MD.  Lipase on admission noted to be elevated to 129.  CT abdomen/pelvis with contrast on admission notable for multiple peripancreatic fluid collections largest measuring 3.7 x 1.8 cm with enhancement of the pancreas, small volume free fluid in the pelvis, no evidence of pancreatic necrosis.  Patient was evaluated by [facility]; with no plans for endoscopic intervention during this hospitalization.  He was initially treated with aggressive IV fluid hydration, and morphine PCA for pain control; in which PCA now has been transitioned to oral oxycodone.  Due to his significant leukocytosis, he was treated with IV Zosyn and will continue Augmentin to complete a 14-day course following discharge.  Recommend following up with his primary gastroenterologist at [facility] for further evaluation and continued treatment.  Left lower lobe pneumonia Left exudative pleural effusion likely secondary to acute pancreatitis CT abdomen/pelvis notable for left lower lobe consolidation.  Underwent IR thoracentesis on 06/26/2019 with 1.3 L of cloudy yellow fluid removed.  Pleural fluid analysis, amylase 1574, glucose 84, LDH 165; serum amylase 433, serum LDH 158 with positive  lights criteria consistent with exudative effusion likely  secondary to acute pancreatitis.  Pleural amylase to serum amylase ratio = 3.6; which is not consistent with a pancreatic pleural fistula.  Patient completed 5-day course of azithromycin, and will transition from Zosyn to Augmentin following discharge to complete a 14-day course.  Patient will also continue furosemide 40 mg p.o. daily for additional 5 days to assist with further diuresis.  Patient instructed to maintain daily weights and to maintain a log.  Pleural fluid culture showed no growth x2 days at time of discharge.  Will need follow-up finalization of pleural fluid culture.  Essential hypertension Started on metoprolol tartrate 25 mg p.o. twice daily.  Discharge Diagnoses:  Active Problems:   Chronic pancreatitis (HCC)   Thrombocytosis (HCC)   LLL pneumonia (HCC)   Recurrent left pleural effusion   Pancreatitis, recurrent   Acute on chronic pancreatitis (HCC)   Constipation    Discharge Instructions  Discharge Instructions    Call MD for:  difficulty breathing, headache or visual disturbances   Complete by: As directed    Call MD for:  extreme fatigue   Complete by: As directed    Call MD for:  persistant dizziness or light-headedness   Complete by: As directed    Call MD for:  persistant nausea and vomiting   Complete by: As directed    Call MD for:  severe uncontrolled pain   Complete by: As directed    Call MD for:  temperature >100.4   Complete by: As directed    Diet - low sodium heart healthy   Complete by: As directed    Increase activity slowly   Complete by: As directed      Allergies as of 06/28/2019      Reactions   Codeine Rash   Other Other (See Comments)   "prolixa, an antidepressant from the 80s"   Fluphenazine Hcl Rash      Medication List    TAKE these medications   acetaminophen 650 MG CR tablet Commonly known as: TYLENOL Take 325 mg by mouth every 8 (eight) hours as needed for pain.   amoxicillin-clavulanate 875-125 MG tablet Commonly  known as: Augmentin Take 1 tablet by mouth 2 (two) times daily for 7 days.   Dexilant 60 MG capsule Generic drug: dexlansoprazole Take 60 mg by mouth daily.   dicyclomine 10 MG capsule Commonly known as: BENTYL Take 10 mg by mouth 3 (three) times daily.   furosemide 40 MG tablet Commonly known as: Lasix Take 1 tablet (40 mg total) by mouth daily for 5 days.   metoprolol tartrate 25 MG tablet Commonly known as: LOPRESSOR Take 1 tablet (25 mg total) by mouth 2 (two) times daily.   multivitamin with minerals Tabs tablet Take 1 tablet by mouth daily.   ondansetron 4 MG tablet Commonly known as: Zofran Take 1 tablet (4 mg total) by mouth daily as needed for nausea or vomiting.   Oxycodone HCl 10 MG Tabs Take 1 tablet (10 mg total) by mouth every 4 (four) hours as needed for up to 7 days for moderate pain. What changed:   medication strength  how much to take  when to take this  reasons to take this   polyethylene glycol 17 g packet Commonly known as: MIRALAX / GLYCOLAX Take 17 g by mouth 2 (two) times daily.   potassium chloride 10 MEQ tablet Commonly known as: K-DUR Take 1 tablet (10 mEq total) by mouth daily for 5 days.  psyllium 0.52 g capsule Commonly known as: REGULOID Take 0.52 g by mouth daily.      Follow-up Information    [facility] [employer] [facility]. Schedule an appointment as soon as possible for a visit in 2 week(s).   Why: F/U with gastroenterology in 2 weeks at [facility]         Allergies  Allergen Reactions  . Codeine Rash  . Other Other (See Comments)    "prolixa, an antidepressant from the 80s"  . Fluphenazine Hcl Rash    Consultations:  [facility] - Dr. Rhea Belton.   Procedures/Studies: Dg Chest 1 View  Result Date: 06/26/2019 CLINICAL DATA:  Status post thoracentesis. EXAM: CHEST  1 VIEW COMPARISON:  06/24/2019 FINDINGS: Interval near complete evacuation of the left pleural fluid collection. A small residual  effusion remains. No evidence of a postprocedural pneumothorax. Left basilar atelectasis is noted. The right lung is clear. No right-sided effusion. IMPRESSION: Interval near complete evacuation of the left pleural fluid collection with left lower lobe atelectasis. No postprocedural pneumothorax. Electronically Signed   By: Rudie Meyer M.D.   On: 06/26/2019 09:40   Dg Abd 1 View  Result Date: 06/21/2019 CLINICAL DATA:  Pt reports extreme abdominal pain across entire abdomen. Reports hx of pancreatitis. Pain with any movements. EXAM: ABDOMEN - 1 VIEW COMPARISON:  None. FINDINGS: The bowel gas pattern is normal. Cholecystectomy clips. No radio-opaque calculi or other significant radiographic abnormality are seen. IMPRESSION: Negative. Electronically Signed   By: Corlis Leak M.D.   On: 06/21/2019 11:04   Ct Chest W Contrast  Result Date: 06/25/2019 CLINICAL DATA:  Shortness of breath, acute on chronic pancreatitis with multiple peripancreatic fluid collections, recurrent epigastric pain EXAM: CT CHEST, ABDOMEN, AND PELVIS WITH CONTRAST TECHNIQUE: Multidetector CT imaging of the chest, abdomen and pelvis was performed following the standard protocol during bolus administration of intravenous contrast. 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 Normal heart size. No pericardial effusion. Mediastinum/Nodes: No enlarged mediastinal, hilar, or axillary lymph nodes. Thyroid gland, trachea, and esophagus demonstrate no significant findings. Lungs/Pleura: There is a large left pleural effusion with associated atelectasis or consolidation, enlarged compared to prior examination. There is a new, trace right pleural effusion. There are scattered subpleural ground-glass pulmonary opacities, most conspicuous in the right upper lobe.  Musculoskeletal: No chest wall mass or suspicious bone lesions identified. CT ABDOMEN PELVIS FINDINGS Hepatobiliary: No focal liver abnormality is seen. Status post cholecystectomy. No biliary dilatation. Pancreas: Redemonstrated extensive retroperitoneal inflammation and multiple peripancreatic fluid collections. There is a new discrete fluid collection or component adjacent to the greater curvature of the stomach and spleen measuring approximately 6.2 x 2.7 cm (series 3, image 52). There is an additional new fluid collection within or adjacent to the omentum measuring 4.7 x 2.5 cm (series 3, image 75). There is an additional new fluid collection within the small bowel mesentery measuring 5.0 x 3.2 cm (series 3, image 86). Other fluid collections are not significantly changed, for example anterior to the pancreatic neck (series 3, image 67), adjacent to the porta hepatis (series 3, image 67), adjacent to the left kidney and splenic flexure (series 3, image 73), and posterior to the left kidney (series 3, image 65). Spleen: Splenomegaly, maximum span 14.4 cm. Adrenals/Urinary Tract: Adrenal glands are unremarkable. Kidneys are normal, without renal calculi, solid lesion, or hydronephrosis.  Bladder is unremarkable. Stomach/Bowel: Stomach is within normal limits. Appendix appears normal. Inflammatory thickening of the transverse colon (series 3, image 81), similar to prior examination, and of the mid small bowel, particularly a segment in the anterior abdomen (series 3, image 95). Vascular/Lymphatic: Aortic atherosclerosis. No enlarged abdominal or pelvic lymph nodes. Reproductive: No mass or other abnormality. Other: Extensive anasarca. Small volume ascites, similar to prior examination. Musculoskeletal: No acute or significant osseous findings. IMPRESSION: 1. There is a large left pleural effusion with associated atelectasis or consolidation, enlarged compared to prior examination. There is a new, trace right  pleural effusion. 2. There are scattered subpleural ground-glass pulmonary opacities, most conspicuous in the right upper lobe. These are nonspecific and infectious or inflammatory. 3. Redemonstrated extensive retroperitoneal inflammation and multiple peripancreatic fluid collections. There is a new discrete fluid collection or component adjacent to the greater curvature of the stomach and spleen measuring approximately 6.2 x 2.7 cm (series 3, image 52). There is an additional new fluid collection within or adjacent to the omentum measuring 4.7 x 2.5 cm (series 3, image 75). There is an additional new fluid collection within the small bowel mesentery measuring 5.0 x 3.2 cm (series 3, image 86). 4. Other fluid collections are not significantly changed, for example anterior to the pancreatic neck (series 3, image 67), adjacent to the porta hepatis (series 3, image 67), adjacent to the left kidney and splenic flexure (series 3, image 73), and posterior to the left kidney (series 3, image 65). 5. Inflammatory thickening of the transverse colon (series 3, image 81), similar to prior examination, and of the mid small bowel, particularly a segment in the anterior abdomen (series 3, image 95). 6. Extensive anasarca. Small volume ascites, similar to prior examination. Electronically Signed   By: Lauralyn Primes M.D.   On: 06/25/2019 14:18   Ct Abdomen Pelvis W Contrast  Result Date: 06/25/2019 CLINICAL DATA:  Shortness of breath, acute on chronic pancreatitis with multiple peripancreatic fluid collections, recurrent epigastric pain EXAM: CT CHEST, ABDOMEN, AND PELVIS WITH CONTRAST TECHNIQUE: Multidetector CT imaging of the chest, abdomen and pelvis was performed following the standard protocol during bolus administration of intravenous contrast. CONTRAST:  OMNIPAQUE IOHEXOL 300 MG/ML SOLN, additional oral enteric contrast COMPARISON:  06/20/2019 FINDINGS: CT CHEST FINDINGS Cardiovascular: Incidental note of aberrant  retroesophageal origin of the right subclavian artery. Normal heart size. No pericardial effusion. Mediastinum/Nodes: No enlarged mediastinal, hilar, or axillary lymph nodes. Thyroid gland, trachea, and esophagus demonstrate no significant findings. Lungs/Pleura: There is a large left pleural effusion with associated atelectasis or consolidation, enlarged compared to prior examination. There is a new, trace right pleural effusion. There are scattered subpleural ground-glass pulmonary opacities, most conspicuous in the right upper lobe. Musculoskeletal: No chest wall mass or suspicious bone lesions identified. CT ABDOMEN PELVIS FINDINGS Hepatobiliary: No focal liver abnormality is seen. Status post cholecystectomy. No biliary dilatation. Pancreas: Redemonstrated extensive retroperitoneal inflammation and multiple peripancreatic fluid collections. There is a new discrete fluid collection or component adjacent to the greater curvature of the stomach and spleen measuring approximately 6.2 x 2.7 cm (series 3, image 52). There is an additional new fluid collection within or adjacent to the omentum measuring 4.7 x 2.5 cm (series 3, image 75). There is an additional new fluid collection within the small bowel mesentery measuring 5.0 x 3.2 cm (series 3, image 86). Other fluid collections are not significantly changed, for example anterior to the pancreatic neck (series 3, image 67), adjacent to the porta  hepatis (series 3, image 67), adjacent to the left kidney and splenic flexure (series 3, image 73), and posterior to the left kidney (series 3, image 65). Spleen: Splenomegaly, maximum span 14.4 cm. Adrenals/Urinary Tract: Adrenal glands are unremarkable. Kidneys are normal, without renal calculi, solid lesion, or hydronephrosis. Bladder is unremarkable. Stomach/Bowel: Stomach is within normal limits. Appendix appears normal. Inflammatory thickening of the transverse colon (series 3, image 81), similar to prior examination,  and of the mid small bowel, particularly a segment in the anterior abdomen (series 3, image 95). Vascular/Lymphatic: Aortic atherosclerosis. No enlarged abdominal or pelvic lymph nodes. Reproductive: No mass or other abnormality. Other: Extensive anasarca. Small volume ascites, similar to prior examination. Musculoskeletal: No acute or significant osseous findings. IMPRESSION: 1. There is a large left pleural effusion with associated atelectasis or consolidation, enlarged compared to prior examination. There is a new, trace right pleural effusion. 2. There are scattered subpleural ground-glass pulmonary opacities, most conspicuous in the right upper lobe. These are nonspecific and infectious or inflammatory. 3. Redemonstrated extensive retroperitoneal inflammation and multiple peripancreatic fluid collections. There is a new discrete fluid collection or component adjacent to the greater curvature of the stomach and spleen measuring approximately 6.2 x 2.7 cm (series 3, image 52). There is an additional new fluid collection within or adjacent to the omentum measuring 4.7 x 2.5 cm (series 3, image 75). There is an additional new fluid collection within the small bowel mesentery measuring 5.0 x 3.2 cm (series 3, image 86). 4. Other fluid collections are not significantly changed, for example anterior to the pancreatic neck (series 3, image 67), adjacent to the porta hepatis (series 3, image 67), adjacent to the left kidney and splenic flexure (series 3, image 73), and posterior to the left kidney (series 3, image 65). 5. Inflammatory thickening of the transverse colon (series 3, image 81), similar to prior examination, and of the mid small bowel, particularly a segment in the anterior abdomen (series 3, image 95). 6. Extensive anasarca. Small volume ascites, similar to prior examination. Electronically Signed   By: Lauralyn Primes M.D.   On: 06/25/2019 14:18   Ct Abdomen Pelvis W Contrast  Result Date:  06/20/2019 CLINICAL DATA:  Abdominal pain.  Neutropenia. EXAM: CT ABDOMEN AND PELVIS WITH CONTRAST TECHNIQUE: Multidetector CT imaging of the abdomen and pelvis was performed using the standard protocol following bolus administration of intravenous contrast. CONTRAST:  OMNIPAQUE IOHEXOL 300 MG/ML  SOLN COMPARISON:  None. FINDINGS: Lower chest: There is a moderate-sized left-sided pleural effusion with near complete collapse of the left lower lobe.The heart size is normal. Hepatobiliary: The liver is normal. Status post cholecystectomy.There is no biliary ductal dilation. Pancreas: There are multiple peripancreatic fluid collections the largest of which measures approximately 3.7 by 1.8 cm. The pancreas appears to enhance symmetrically. Spleen: The spleen is enlarged measuring approximately 14 cm craniocaudad. Adrenals/Urinary Tract: --Adrenal glands: No adrenal hemorrhage. --Right kidney/ureter: No hydronephrosis or perinephric hematoma. --Left kidney/ureter: There is no left-sided hydronephrosis. There is a complex collection in the left posterior pararenal space measuring approximately 8.5 x 2.5 cm. --Urinary bladder: Unremarkable. Stomach/Bowel: --Stomach/Duodenum: There is some wall thickening of the stomach. --Small bowel: No dilatation or inflammation. --Colon: There are soft tissue densities along the descending colon measuring approximately 2.8 x 2.4 cm. There is a collection of the splenic flexure measuring 4.7 x 2.7 cm causing mass effect on the nearby:Marland Kitchen There is wall thickening of the transverse colon without evidence of an obstruction. --Appendix: Not visualized. No  right lower quadrant inflammation or free fluid. Vascular/Lymphatic: Atherosclerotic changes are noted of the abdominal aorta without evidence of an abdominal aortic aneurysm. The portal vein and splenic vein remain patent. The splenic artery remains patent. --No retroperitoneal lymphadenopathy. --No mesenteric lymphadenopathy. --No  pelvic or inguinal lymphadenopathy. Reproductive: Unremarkable Other: There is a small volume of free fluid in the pelvis. There are multiple scattered collections in the retroperitoneum and peritoneal cavity. A few these collections demonstrate mild rim enhancement. For example in the left upper quadrant there is a 3.9 x 1.9 cm collection that demonstrates mild peripheral rim enhancement. There is a 3.8 by 4.4 cm collection in the region of the gallbladder fossa. Given the surgical clips in the gallbladder fossa this is favored to represent a loculated fluid collection as opposed to a remnant gallbladder. Musculoskeletal. No acute displaced fractures. IMPRESSION: 1. Overall findings concerning for pancreatitis with multiple loculated fluid collections as detailed above. Some of the smaller collections, for example in the left upper quadrant, demonstrate rim enhancement. An abscess is not excluded. There is no CT evidence for pancreatic necrosis. 2. Moderate-sized left-sided pleural effusion with at least partial collapse of the left lower lobe. 3. Diffuse wall thickening of the transverse colon and splenic flexure favored to be reactive. Other considerations include infectious or inflammatory colitis. 4. Splenomegaly.  The splenic vein remains patent. Electronically Signed   By: Katherine Mantle M.D.   On: 06/20/2019 21:07   Dg Chest Port 1 View  Result Date: 06/24/2019 CLINICAL DATA:  Chest pain for 1 hour EXAM: PORTABLE CHEST 1 VIEW COMPARISON:  06/20/2019 FINDINGS: Left-sided pleural effusion is again seen and slightly enlarged when compare with the prior study. Likely underlying atelectasis/infiltrate is present in the left base. The right lung is clear. No bony abnormality is noted. IMPRESSION: Increasing left-sided pleural effusion. Electronically Signed   By: Alcide Clever M.D.   On: 06/24/2019 19:33   Dg Chest Portable 1 View  Result Date: 06/20/2019 CLINICAL DATA:  Chest pain EXAM: PORTABLE CHEST 1  VIEW COMPARISON:  None. FINDINGS: There is airspace consolidation in the left base with left pleural effusion. Lungs elsewhere are clear. Heart size and pulmonary vascularity are normal. No adenopathy. No bone lesions. IMPRESSION: Left lower lobe airspace consolidation consistent with pneumonia. Small left pleural effusion. Lungs elsewhere clear. No adenopathy. Heart size normal. Followup PA and lateral chest radiographs recommended in 3-4 weeks following trial of antibiotic therapy to ensure resolution and exclude underlying malignancy. Electronically Signed   By: Bretta Bang III M.D.   On: 06/20/2019 19:32   Ir Thoracentesis Asp Pleural Space W/img Guide  Result Date: 06/26/2019 INDICATION: Shortness of breath. Left-sided pleural effusion. Request for diagnostic and therapeutic thoracentesis. EXAM: ULTRASOUND GUIDED LEFT THORACENTESIS MEDICATIONS: None. COMPLICATIONS: None immediate. PROCEDURE: An ultrasound guided thoracentesis was thoroughly discussed with the patient and questions answered. The benefits, risks, alternatives and complications were also discussed. The patient understands and wishes to proceed with the procedure. Written consent was obtained. Ultrasound was performed to localize and mark an adequate pocket of fluid in the left chest. The area was then prepped and draped in the normal sterile fashion. 1% Lidocaine was used for local anesthesia. Under ultrasound guidance a 6 Fr Safe-T-Centesis catheter was introduced. Thoracentesis was performed. The catheter was removed and a dressing applied. FINDINGS: A total of approximately 1.3 L of hazy yellow fluid was removed. Samples were sent to the laboratory as requested by the clinical team. IMPRESSION: Successful ultrasound guided left thoracentesis  yielding 1.3 L of pleural fluid. Read by: Brayton El PA-C Electronically Signed   By: Richarda Overlie M.D.   On: 06/26/2019 09:19      Subjective: Patient seen and examined at bedside, resting  comfortably.  States shortness of breath is markedly improved since undergoing thoracentesis.  No other complaints or concerns at this time.  Denies headache, no fever/chills/night sweats, no vomiting/diarrhea, no chest pain, no palpitations, no abdominal pain, no weakness, no issues with bowel/bladder function, no paresthesias.  No acute events overnight per nursing staff.   Discharge Exam: Vitals:   06/28/19 0512 06/28/19 0514  BP: 128/87   Pulse: (!) 103 (!) 102  Resp: 18   Temp: 99.5 F (37.5 C)   SpO2: (!) 88% 91%   Vitals:   06/27/19 1425 06/27/19 2110 06/28/19 0512 06/28/19 0514  BP: 127/79 129/77 128/87   Pulse: (!) 101 (!) 105 (!) 103 (!) 102  Resp: 16 16 18    Temp: 98.6 F (37 C) 99.2 F (37.3 C) 99.5 F (37.5 C)   TempSrc: Oral Oral Oral   SpO2: 94% 93% (!) 88% 91%  Weight:      Height:        General: Pt is alert, awake, not in acute distress Cardiovascular: RRR, S1/S2 +, no rubs, no gallops Respiratory: CTA bilaterally, no wheezing, no rhonchi Abdominal: Soft, mild distention, NT, bowel sounds + Extremities: no edema, no cyanosis    The results of significant diagnostics from this hospitalization (including imaging, microbiology, ancillary and laboratory) are listed below for reference.     Microbiology: Recent Results (from the past 240 hour(s))  Blood Culture (routine x 2)     Status: None   Collection Time: 06/20/19  9:20 PM   Specimen: BLOOD RIGHT WRIST  Result Value Ref Range Status   Specimen Description BLOOD RIGHT WRIST  Final   Special Requests   Final    BOTTLES DRAWN AEROBIC AND ANAEROBIC Blood Culture results may not be optimal due to an inadequate volume of blood received in culture bottles   Culture   Final    NO GROWTH 5 DAYS Performed at [facility] Lab, [address] [place], [place] [address]    Report Status 06/25/2019 FINAL  Final  Blood Culture (routine x 2)     Status: None   Collection Time: 06/20/19  9:30 PM   Specimen:  BLOOD RIGHT FOREARM  Result Value Ref Range Status   Specimen Description BLOOD RIGHT FOREARM  Final   Special Requests   Final    BOTTLES DRAWN AEROBIC AND ANAEROBIC Blood Culture adequate volume   Culture   Final    NO GROWTH 5 DAYS Performed at [facility] Lab, [address], [place], [place] [address]    Report Status 06/25/2019 FINAL  Final  SARS Coronavirus 2 [facility] order, Performed in [facility] hospital lab) Nasopharyngeal Nasopharyngeal Swab     Status: None   Collection Time: 06/20/19  9:33 PM   Specimen: Nasopharyngeal Swab  Result Value Ref Range Status   SARS Coronavirus 2 NEGATIVE NEGATIVE Final    Comment: (NOTE) If result is NEGATIVE SARS-CoV-2 target nucleic acids are NOT DETECTED. The SARS-CoV-2 RNA is generally detectable in upper and lower  respiratory specimens during the acute phase of infection. The lowest  concentration of SARS-CoV-2 viral copies this assay can detect is 250  copies / mL. A negative result does not preclude SARS-CoV-2 infection  and should not be used as  the sole basis for treatment or other  patient management decisions.  A negative result may occur with  improper specimen collection / handling, submission of specimen other  than nasopharyngeal swab, presence of viral mutation(s) within the  areas targeted by this assay, and inadequate number of viral copies  (<250 copies / mL). A negative result must be combined with clinical  observations, patient history, and epidemiological information. If result is POSITIVE SARS-CoV-2 target nucleic acids are DETECTED. The SARS-CoV-2 RNA is generally detectable in upper and lower  respiratory specimens dur ing the acute phase of infection.  Positive  results are indicative of active infection with SARS-CoV-2.  Clinical  correlation with patient history and other diagnostic information is  necessary to determine patient infection status.  Positive results do  not rule out bacterial infection  or co-infection with other viruses. If result is PRESUMPTIVE POSTIVE SARS-CoV-2 nucleic acids MAY BE PRESENT.   A presumptive positive result was obtained on the submitted specimen  and confirmed on repeat testing.  While 2019 novel coronavirus  (SARS-CoV-2) nucleic acids may be present in the submitted sample  additional confirmatory testing may be necessary for epidemiological  and / or clinical management purposes  to differentiate between  SARS-CoV-2 and other Sarbecovirus currently known to infect humans.  If clinically indicated additional testing with an alternate test  methodology [phone]) is advised. The SARS-CoV-2 RNA is generally  detectable in upper and lower respiratory sp ecimens during the acute  phase of infection. The expected result is Negative. Fact Sheet for Patients:  BoilerBrush.com.cy Fact Sheet for Healthcare Providers: https://pope.com/ This test is not yet approved or cleared by the [place] FDA and has been authorized for detection and/or diagnosis of SARS-CoV-2 by FDA under an Emergency Use Authorization (EUA).  This EUA will remain in effect (meaning this test can be used) for the duration of the COVID-19 declaration under Section 564(b)(1) of the Act, 21 U.S.C. section 360bbb-3(b)(1), unless the authorization is terminated or revoked sooner. Performed at [facility] Lab, [address] [place], [place] [address]   Urine culture     Status: None   Collection Time: 06/21/19  3:42 AM   Specimen: Urine, Clean Catch  Result Value Ref Range Status   Specimen Description URINE, CLEAN CATCH  Final   Special Requests NONE  Final   Culture   Final    NO GROWTH Performed at [facility] Lab, [address] [place], [place] [address]    Report Status 06/22/2019 FINAL  Final  SARS CORONAVIRUS 2 Nasal Swab Aptima Multi Swab     Status: None   Collection Time: 06/25/19 11:00 AM   Specimen: Aptima Multi  Swab; Nasal Swab  Result Value Ref Range Status   SARS Coronavirus 2 NEGATIVE NEGATIVE Final    Comment: (NOTE) SARS-CoV-2 target nucleic acids are NOT DETECTED. The SARS-CoV-2 RNA is generally detectable in upper and lower respiratory specimens during the acute phase of infection. Negative results do not preclude SARS-CoV-2 infection, do not rule out co-infections with other pathogens, and should not be used as the sole basis for treatment or other patient management decisions. Negative results must be combined with clinical observations, patient history, and epidemiological information. The expected result is Negative. Fact Sheet for Patients: HairSlick.no Fact Sheet for Healthcare Providers: quierodirigir.com This test is not yet approved or cleared by the [place] FDA and  has been authorized for detection and/or diagnosis of SARS-CoV-2 by FDA under an Emergency Use  Authorization (EUA). This EUA will remain  in effect (meaning this test can be used) for the duration of the COVID-19 declaration under Section 56 4(b)(1) of the Act, 21 U.S.C. section 360bbb-3(b)(1), unless the authorization is terminated or revoked sooner. Performed at [facility] Lab, [address], [place], [place] [address]   Gram stain     Status: None   Collection Time: 06/26/19  9:19 AM   Specimen: Pleural, Right; Pleural Fluid  Result Value Ref Range Status   Specimen Description PLEURAL RIGHT  Final   Special Requests NONE  Final   Gram Stain   Final    CYTOSPIN SMEAR WBC PRESENT, PREDOMINANTLY PMN NO ORGANISMS SEEN Performed at [facility] Lab, [address] [place], [place] [address]    Report Status 06/26/2019 FINAL  Final  Culture, body fluid-bottle     Status: None (Preliminary result)   Collection Time: 06/26/19  9:19 AM   Specimen: Pleura  Result Value Ref Range Status   Specimen Description PLEURAL RIGHT  Final   Special  Requests BOTTLES DRAWN AEROBIC AND ANAEROBIC  Final   Culture   Final    NO GROWTH 2 DAYS Performed at [facility] Lab, [address] [place], [place] [address]    Report Status PENDING  Incomplete     Labs: BNP (last 3 results) No results for input(s): BNP in the last 8760 hours. Basic Metabolic Panel: Recent Labs  Lab 06/22/19 0200 06/23/19 0142 06/24/19 0158 06/25/19 0400 06/26/19 0353 06/27/19 0324 06/28/19 0418  NA 131* 132* 131* 130* 130* 128* 131*  K 3.9 3.9 4.5 3.5 3.7 2.9* 3.0*  CL 100 99 99 96* 92* 90* 86*  CO2 25 24 23 23 27 30  32  GLUCOSE 105* 90 114* 107* 87 115* 92  BUN 9 7 6  <5* <5* 6 8  CREATININE 0.71 0.61 0.66 0.48* 0.68 0.65 0.67  CALCIUM 8.2* 8.0* 8.1* 8.0* 8.2* 8.0* 8.3*  MG 1.4* 1.7  --   --   --   --   --    Liver Function Tests: Recent Labs  Lab 06/22/19 0200 06/23/19 0142 06/26/19 0353  AST 14* 16 28  ALT 25 20 28   ALKPHOS 87 91 123  BILITOT 0.5 0.5 0.6  PROT 4.8* 4.8* 4.8*  ALBUMIN 1.7* 1.5* 1.4*   Recent Labs  Lab 06/23/19 1136 06/24/19 0158 06/25/19 0400 06/26/19 0353 06/26/19 1551 06/27/19 0324  LIPASE 93* 115* 212* 193*  --  315*  AMYLASE  --   --   --   --  433*  --    No results for input(s): AMMONIA in the last 168 hours. CBC: Recent Labs  Lab 06/24/19 0818 06/25/19 0400 06/26/19 0353 06/27/19 0324 06/28/19 0418  WBC 14.8* 21.6* 21.2* 21.0* 22.8*  HGB 9.0* 10.4* 9.0* 9.1* 9.2*  HCT 27.7* 32.8* 28.0* 28.2* 28.8*  MCV 87.9 88.2 87.0 87.3 86.7  PLT 522* 696* 674* 719* 751*   Cardiac Enzymes: No results for input(s): CKTOTAL, CKMB, CKMBINDEX, TROPONINI in the last 168 hours. BNP: Invalid input(s): POCBNP CBG: No results for input(s): GLUCAP in the last 168 hours. D-Dimer No results for input(s): DDIMER in the last 72 hours. Hgb A1c No results for input(s): HGBA1C in the last 72 hours. Lipid Profile No results for input(s): CHOL, HDL, LDLCALC, TRIG, CHOLHDL, LDLDIRECT in the last 72 hours. Thyroid  function studies No results for input(s): TSH, T4TOTAL, T3FREE, THYROIDAB in the last 72 hours.  Invalid  input(s): FREET3 Anemia work up No results for input(s): VITAMINB12, FOLATE, FERRITIN, TIBC, IRON, RETICCTPCT in the last 72 hours. Urinalysis    Component Value Date/Time   COLORURINE AMBER (A) 06/21/2019 0342   APPEARANCEUR CLEAR 06/21/2019 0342   LABSPEC >1.046 (H) 06/21/2019 0342   PHURINE 5.0 06/21/2019 0342   GLUCOSEU NEGATIVE 06/21/2019 0342   HGBUR NEGATIVE 06/21/2019 0342   BILIRUBINUR NEGATIVE 06/21/2019 0342   KETONESUR NEGATIVE 06/21/2019 0342   PROTEINUR 30 (A) 06/21/2019 0342   NITRITE NEGATIVE 06/21/2019 0342   LEUKOCYTESUR NEGATIVE 06/21/2019 0342   Sepsis Labs Invalid input(s): PROCALCITONIN,  WBC,  LACTICIDVEN Microbiology Recent Results (from the past 240 hour(s))  Blood Culture (routine x 2)     Status: None   Collection Time: 06/20/19  9:20 PM   Specimen: BLOOD RIGHT WRIST  Result Value Ref Range Status   Specimen Description BLOOD RIGHT WRIST  Final   Special Requests   Final    BOTTLES DRAWN AEROBIC AND ANAEROBIC Blood Culture results may not be optimal due to an inadequate volume of blood received in culture bottles   Culture   Final    NO GROWTH 5 DAYS Performed at [facility] Lab, [address], [place], [place] [address]    Report Status 06/25/2019 FINAL  Final  Blood Culture (routine x 2)     Status: None   Collection Time: 06/20/19  9:30 PM   Specimen: BLOOD RIGHT FOREARM  Result Value Ref Range Status   Specimen Description BLOOD RIGHT FOREARM  Final   Special Requests   Final    BOTTLES DRAWN AEROBIC AND ANAEROBIC Blood Culture adequate volume   Culture   Final    NO GROWTH 5 DAYS Performed at [facility] Lab, [address], [place], [place] [address]    Report Status 06/25/2019 FINAL  Final  SARS Coronavirus 2 [facility] order, Performed in [facility] hospital lab) Nasopharyngeal Nasopharyngeal Swab     Status: None    Collection Time: 06/20/19  9:33 PM   Specimen: Nasopharyngeal Swab  Result Value Ref Range Status   SARS Coronavirus 2 NEGATIVE NEGATIVE Final    Comment: (NOTE) If result is NEGATIVE SARS-CoV-2 target nucleic acids are NOT DETECTED. The SARS-CoV-2 RNA is generally detectable in upper and lower  respiratory specimens during the acute phase of infection. The lowest  concentration of SARS-CoV-2 viral copies this assay can detect is 250  copies / mL. A negative result does not preclude SARS-CoV-2 infection  and should not be used as the sole basis for treatment or other  patient management decisions.  A negative result may occur with  improper specimen collection / handling, submission of specimen other  than nasopharyngeal swab, presence of viral mutation(s) within the  areas targeted by this assay, and inadequate number of viral copies  (<250 copies / mL). A negative result must be combined with clinical  observations, patient history, and epidemiological information. If result is POSITIVE SARS-CoV-2 target nucleic acids are DETECTED. The SARS-CoV-2 RNA is generally detectable in upper and lower  respiratory specimens dur ing the acute phase of infection.  Positive  results are indicative of active infection with SARS-CoV-2.  Clinical  correlation with patient history and other diagnostic information is  necessary to determine patient infection status.  Positive results do  not rule out bacterial infection or co-infection with other viruses. If result is PRESUMPTIVE POSTIVE SARS-CoV-2 nucleic acids MAY BE PRESENT.   A presumptive positive result was obtained  on the submitted specimen  and confirmed on repeat testing.  While 2019 novel coronavirus  (SARS-CoV-2) nucleic acids may be present in the submitted sample  additional confirmatory testing may be necessary for epidemiological  and / or clinical management purposes  to differentiate between  SARS-CoV-2 and other Sarbecovirus  currently known to infect humans.  If clinically indicated additional testing with an alternate test  methodology [phone]) is advised. The SARS-CoV-2 RNA is generally  detectable in upper and lower respiratory sp ecimens during the acute  phase of infection. The expected result is Negative. Fact Sheet for Patients:  BoilerBrush.com.cy Fact Sheet for Healthcare Providers: https://pope.com/ This test is not yet approved or cleared by the [place] FDA and has been authorized for detection and/or diagnosis of SARS-CoV-2 by FDA under an Emergency Use Authorization (EUA).  This EUA will remain in effect (meaning this test can be used) for the duration of the COVID-19 declaration under Section 564(b)(1) of the Act, 21 U.S.C. section 360bbb-3(b)(1), unless the authorization is terminated or revoked sooner. Performed at [facility] Lab, [address] [place], [place] [address]   Urine culture     Status: None   Collection Time: 06/21/19  3:42 AM   Specimen: Urine, Clean Catch  Result Value Ref Range Status   Specimen Description URINE, CLEAN CATCH  Final   Special Requests NONE  Final   Culture   Final    NO GROWTH Performed at [facility] Lab, [address] [place], [place] [address]    Report Status 06/22/2019 FINAL  Final  SARS CORONAVIRUS 2 Nasal Swab Aptima Multi Swab     Status: None   Collection Time: 06/25/19 11:00 AM   Specimen: Aptima Multi Swab; Nasal Swab  Result Value Ref Range Status   SARS Coronavirus 2 NEGATIVE NEGATIVE Final    Comment: (NOTE) SARS-CoV-2 target nucleic acids are NOT DETECTED. The SARS-CoV-2 RNA is generally detectable in upper and lower respiratory specimens during the acute phase of infection. Negative results do not preclude SARS-CoV-2 infection, do not rule out co-infections with other pathogens, and should not be used as the sole basis for treatment or other patient management  decisions. Negative results must be combined with clinical observations, patient history, and epidemiological information. The expected result is Negative. Fact Sheet for Patients: HairSlick.no Fact Sheet for Healthcare Providers: quierodirigir.com This test is not yet approved or cleared by the [place] FDA and  has been authorized for detection and/or diagnosis of SARS-CoV-2 by FDA under an Emergency Use Authorization (EUA). This EUA will remain  in effect (meaning this test can be used) for the duration of the COVID-19 declaration under Section 56 4(b)(1) of the Act, 21 U.S.C. section 360bbb-3(b)(1), unless the authorization is terminated or revoked sooner. Performed at [facility] Lab, [address] [place], [place] [address]   Gram stain     Status: None   Collection Time: 06/26/19  9:19 AM   Specimen: Pleural, Right; Pleural Fluid  Result Value Ref Range Status   Specimen Description PLEURAL RIGHT  Final   Special Requests NONE  Final   Gram Stain   Final    CYTOSPIN SMEAR WBC PRESENT, PREDOMINANTLY PMN NO ORGANISMS SEEN Performed at [facility] Lab, [address], [place], [place] [address]    Report Status 06/26/2019 FINAL  Final  Culture, body fluid-bottle     Status: None (Preliminary result)   Collection Time: 06/26/19  9:19 AM   Specimen:  Pleura  Result Value Ref Range Status   Specimen Description PLEURAL RIGHT  Final   Special Requests BOTTLES DRAWN AEROBIC AND ANAEROBIC  Final   Culture   Final    NO GROWTH 2 DAYS Performed at [facility] Lab, [address] [place], [place] [address]    Report Status PENDING  Incomplete     Time coordinating discharge: Over 30 minutes  SIGNED:   Imelda Dandridge J [place], MD  [facility] 06/28/2019, 12:20 PM Pager   If 7PM-7AM, please contact night-coverage [email or web address] Password [email or web address]

## 2019-06-28 NOTE — Discharge Instructions (Signed)
Acute Pancreatitis  Acute pancreatitis happens when the pancreas gets swollen. The pancreas is a large gland in the body that helps to control blood sugar. It also makes enzymes that help to digest food. This condition can last a few days and cause serious problems. The lungs, heart, and kidneys may stop working. What are the causes? Causes include:  Alcohol abuse.  Drug abuse.  Gallstones.  A tumor in the pancreas. Other causes include:  Some medicines.  Some chemicals.  Diabetes.  An infection.  Damage caused by an accident.  The poison (venom) from a scorpion bite.  Belly (abdominal) surgery.  The body's defense system (immune system) attacking the pancreas (autoimmune pancreatitis).  Genes that are passed from parent to child (inherited). In some cases, the cause is not known. What are the signs or symptoms?  Pain in the upper belly that may be felt in the back. The pain may be very bad.  Swelling of the belly.  Feeling sick to your stomach (nauseous) and throwing up (vomiting).  Fever. How is this treated? You will likely have to stay in the hospital. Treatment may include:  Pain medicine.  Fluid through an IV tube.  Placing a tube in the stomach to take out the stomach contents. This may help you stop throwing up.  Not eating for 3-4 days.  Antibiotic medicines, if you have an infection.  Treating any other problems that may be the cause.  Steroid medicines, if your problem is caused by your defense system attacking your body's own tissues.  Surgery. Follow these instructions at home: Eating and drinking   Follow instructions from your doctor about what to eat and drink.  Eat foods that do not have a lot of fat in them.  Eat small meals often. Do not eat big meals.  Drink enough fluid to keep your pee (urine) pale yellow.  Do not drink alcohol if it caused your condition. Medicines  Take over-the-counter and prescription medicines only  as told by your doctor.  Ask your doctor if the medicine prescribed to you: ? Requires you to avoid driving or using heavy machinery. ? Can cause trouble pooping (constipation). You may need to take steps to prevent or treat trouble pooping:  Take over-the-counter or prescription medicines.  Eat foods that are high in fiber. These include beans, whole grains, and fresh fruits and vegetables.  Limit foods that are high in fat and sugar. These include fried or sweet foods. General instructions  Do not use any products that contain nicotine or tobacco, such as cigarettes, e-cigarettes, and chewing tobacco. If you need help quitting, ask your doctor.  Get plenty of rest.  Check your blood sugar at home as told by your doctor.  Keep all follow-up visits as told by your doctor. This is important. Contact a doctor if:  You do not get better as quickly as expected.  You have new symptoms.  Your symptoms get worse.  You have pain or weakness that lasts a long time.  You keep feeling sick to your stomach.  You get better and then you have pain again.  You have a fever. Get help right away if:  You cannot eat or keep fluids down.  Your pain gets very bad.  Your skin or the white part of your eyes turns yellow.  You have sudden swelling in your belly.  You throw up.  You feel dizzy or you pass out (faint).  Your blood sugar is high (over 300  mg/dL). Summary  Acute pancreatitis happens when the pancreas gets swollen.  This condition is often caused by alcohol abuse, drug abuse, or gallstones.  You will likely have to stay in the hospital for treatment. This information is not intended to replace advice given to you by your health care provider. Make sure you discuss any questions you have with your health care provider. Document Released: 04/23/2008 Document Revised: 08/25/2018 Document Reviewed: 08/25/2018 Elsevier Patient Education  2020 [employer].  Pleural  Effusion Pleural effusion is an abnormal buildup of fluid in the layers of tissue between the lungs and the inside of the chest (pleural space) The two layers of tissue that line the lungs and the inside of the chest are called pleura. Usually, there is no air in the space between the pleura, only a thin layer of fluid. Some conditions can cause a large amount of fluid to build up, which can cause the lung to collapse if untreated. A pleural effusion is usually caused by another disease that requires treatment. What are the causes? Pleural effusion can be caused by:  Heart failure.  Certain infections, such as pneumonia or tuberculosis.  Cancer.  A blood clot in the lung (pulmonary embolism).  Complications from surgery, such as from open heart surgery.  Liver disease (cirrhosis).  Kidney disease. What are the signs or symptoms? In some cases, pleural effusion may cause no symptoms. If symptoms are present, they may include:  Shortness of breath, especially when lying down.  Chest pain. This may get worse when taking a deep breath.  Fever.  Dry, long-lasting (chronic) cough.  Hiccups.  Rapid breathing. An underlying condition that is causing the pleural effusion (such as heart failure, pneumonia, blood clots, tuberculosis, or cancer) may also cause other symptoms. How is this diagnosed? This condition may be diagnosed based on:  Your symptoms and medical history.  A physical exam.  A chest X-ray.  A procedure to use a needle to remove fluid from the pleural space (thoracentesis). This fluid is tested.  Other imaging studies of the chest, such as ultrasound or CT scan. How is this treated? Depending on the cause of your condition, treatment may include:  Treating the underlying condition that is causing the effusion. When that condition improves, the effusion will also improve. Examples of treatment for underlying conditions include: ? Antibiotic medicines to treat an  infection. ? Diuretics or other heart medicines to treat heart failure.  Thoracentesis.  Placing a thin flexible tube under your skin and into your chest to continuously drain the effusion (indwelling pleural catheter).  Surgery to remove the outer layer of tissue from the pleural space (decortication).  A procedure to put medicine into the chest cavity to seal the pleural space and prevent fluid buildup (pleurodesis).  Chemotherapy and radiation therapy, if you have cancerous (malignant) pleural effusion. These treatments are typically used to treat cancer. They kill certain cells in the body. Follow these instructions at home:  Take over-the-counter and prescription medicines only as told by your health care provider.  Ask your health care provider what activities are safe for you.  Keep track of how long you are able to do mild exercise (such as walking) before you get short of breath. Write down this information to share with your health care provider. Your ability to exercise should improve over time.  Do not use any products that contain nicotine or tobacco, such as cigarettes and e-cigarettes. If you need help quitting, ask your health care  provider.  Keep all follow-up visits as told by your health care provider. This is important. Contact a health care provider if:  The amount of time that you are able to do mild exercise: ? Decreases. ? Does not improve with time.  You have a fever. Get help right away if:  You are short of breath.  You develop chest pain.  You develop a new cough. Summary  Pleural effusion is an abnormal buildup of fluid in the layers of tissue between the lungs and the inside of the chest.  Pleural effusion can have many causes, including heart failure, pulmonary embolism, infections, or cancer.  Symptoms of pleural effusion can include shortness of breath, chest pain, fever, long-lasting (chronic) cough, hiccups, or rapid  breathing.  Diagnosis often involves making images of the chest (such as with ultrasound or X-ray) and removing fluid (thoracentesis) to send for testing.  Treatment for pleural effusion depends on what underlying condition is causing it. This information is not intended to replace advice given to you by your health care provider. Make sure you discuss any questions you have with your health care provider. Document Released: 11/05/2005 Document Revised: 10/18/2017 Document Reviewed: 07/11/2017 Elsevier Patient Education  2020 [employer].  Pancreatitis Eating Plan Pancreatitis is when your pancreas becomes irritated and swollen (inflamed). The pancreas is a small organ located behind your stomach. It helps your body digest food and regulate your blood sugar. Pancreatitis can affect how your body digests food, especially foods with fat. You may also have other symptoms such as abdominal pain or nausea. When you have pancreatitis, following a low-fat eating plan may help you manage symptoms and recover more quickly. Work with your health care provider or a diet and nutrition specialist (dietitian) to create an eating plan that is right for you. What are tips for following this plan? Reading food labels Use the information on food labels to help keep track of how much fat you eat:  Check the serving size.  Look for the amount of total fat in grams (g) in one serving. ? Low-fat foods have 3 g of fat or less per serving. ? Fat-free foods have 0.5 g of fat or less per serving.  Keep track of how much fat you eat based on how many servings you eat. ? For example, if you eat two servings, the amount of fat you eat will be two times what is listed on the label. Shopping   Buy low-fat or nonfat foods, such as: ? Fresh, frozen, or canned fruits and vegetables. ? Grains, including pasta, bread, and rice. ? Lean meat, poultry, fish, and other protein foods. ? Low-fat or nonfat dairy.  Avoid  buying bakery products and other sweets made with whole milk, butter, and eggs.  Avoid buying snack foods with added fat, such as anything with butter or cheese flavoring. Cooking  Remove skin from poultry, and remove extra fat from meat.  Limit the amount of fat and oil you use to 6 teaspoons or less per day.  Cook using low-fat methods, such as boiling, broiling, grilling, steaming, or baking.  Use spray oil to cook. Add fat-free chicken broth to add flavor and moisture.  Avoid adding cream to thicken soups or sauces. Use other thickeners such as corn starch or tomato paste. Meal planning   Eat a low-fat diet as told by your dietitian. For most people, this means having no more than 55-65 grams of fat each day.  Eat small, frequent meals  throughout the day. For example, you may have 5-6 small meals instead of 3 large meals.  Drink enough fluid to keep your urine pale yellow.  Do not drink alcohol. Talk to your health care provider if you need help stopping.  Limit how much caffeine you have, including black coffee, black and [place] tea, caffeinated soft drinks, and energy drinks. General information  Let your health care provider or dietitian know if you have unplanned weight loss on this eating plan.  You may be instructed to follow a clear liquid diet during a flare of symptoms. Talk with your health care provider about how to manage your diet during symptoms of a flare.  Take any vitamins or supplements as told by your health care provider.  Work with a Microbiologist, especially if you have other conditions such as obesity or diabetes mellitus. What foods should I avoid? Fruits Fried fruits. Fruits served with butter or cream. Vegetables Fried vegetables. Vegetables cooked with butter, cheese, or cream. Grains Biscuits, waffles, donuts, pastries, and croissants. Pies and cookies. Butter-flavored popcorn. Regular crackers. Meats and other protein foods Fatty cuts of meat.  Poultry with skin. Organ meats. Bacon, sausage, and cold cuts. Whole eggs. Nuts and nut butters. Dairy Whole and 2% milk. Whole milk yogurt. Whole milk ice cream. Cream and half-and-half. Cream cheese. Sour cream. Cheese. Beverages Wine, beer, and liquor. The items listed above may not be a complete list of foods and beverages to avoid. Contact a dietitian for more information. Summary  Pancreatitis can affect how your body digests food, especially foods with fat.  When you have pancreatitis, it is recommended that you follow a low-fat eating plan to help you recover more quickly and manage symptoms. For most people, this means limiting fat to no more than 55-65 grams per day.  Do not drink alcohol. Limit the amount of caffeine you have, and drink enough fluid to keep your urine pale yellow. This information is not intended to replace advice given to you by your health care provider. Make sure you discuss any questions you have with your health care provider. Document Released: 02/11/2018 Document Revised: 02/26/2019 Document Reviewed: 02/11/2018 Elsevier Patient Education  2020 [employer].

## 2019-06-30 DIAGNOSIS — R109 Unspecified abdominal pain: Secondary | ICD-10-CM

## 2019-07-01 LAB — CULTURE, BODY FLUID W GRAM STAIN -BOTTLE: Culture: NO GROWTH

## 2019-07-01 LAB — CHOLESTEROL, BODY FLUID: Cholesterol, Fluid: 47 mg/dL

## 2019-07-05 ENCOUNTER — Inpatient Hospital Stay (HOSPITAL_COMMUNITY)
Admission: EM | Admit: 2019-07-05 | Discharge: 2019-07-15 | DRG: 438 | Disposition: A | Discharge status: Other Acute Inpatient Hospital | Payer: Self-pay | Attending: Internal Medicine | Admitting: Internal Medicine

## 2019-07-05 ENCOUNTER — Inpatient Hospital Stay (HOSPITAL_COMMUNITY): Payer: Self-pay

## 2019-07-05 ENCOUNTER — Encounter (HOSPITAL_COMMUNITY): Payer: Self-pay

## 2019-07-05 ENCOUNTER — Other Ambulatory Visit: Payer: Self-pay

## 2019-07-05 ENCOUNTER — Emergency Department (HOSPITAL_COMMUNITY): Payer: Self-pay

## 2019-07-05 DIAGNOSIS — Z885 Allergy status to narcotic agent status: Secondary | ICD-10-CM

## 2019-07-05 DIAGNOSIS — R0789 Other chest pain: Secondary | ICD-10-CM

## 2019-07-05 DIAGNOSIS — T85598A Other mechanical complication of other gastrointestinal prosthetic devices, implants and grafts, initial encounter: Secondary | ICD-10-CM

## 2019-07-05 DIAGNOSIS — Z20828 Contact with and (suspected) exposure to other viral communicable diseases: Secondary | ICD-10-CM | POA: Diagnosis present

## 2019-07-05 DIAGNOSIS — R64 Cachexia: Secondary | ICD-10-CM | POA: Diagnosis present

## 2019-07-05 DIAGNOSIS — R066 Hiccough: Secondary | ICD-10-CM | POA: Diagnosis present

## 2019-07-05 DIAGNOSIS — D638 Anemia in other chronic diseases classified elsewhere: Secondary | ICD-10-CM | POA: Diagnosis present

## 2019-07-05 DIAGNOSIS — E43 Unspecified severe protein-calorie malnutrition: Secondary | ICD-10-CM | POA: Diagnosis present

## 2019-07-05 DIAGNOSIS — K219 Gastro-esophageal reflux disease without esophagitis: Secondary | ICD-10-CM | POA: Diagnosis present

## 2019-07-05 DIAGNOSIS — Z9049 Acquired absence of other specified parts of digestive tract: Secondary | ICD-10-CM

## 2019-07-05 DIAGNOSIS — F1020 Alcohol dependence, uncomplicated: Secondary | ICD-10-CM | POA: Diagnosis present

## 2019-07-05 DIAGNOSIS — Z87891 Personal history of nicotine dependence: Secondary | ICD-10-CM

## 2019-07-05 DIAGNOSIS — Z6822 Body mass index (BMI) 22.0-22.9, adult: Secondary | ICD-10-CM

## 2019-07-05 DIAGNOSIS — Z4682 Encounter for fitting and adjustment of non-vascular catheter: Secondary | ICD-10-CM

## 2019-07-05 DIAGNOSIS — Z720 Tobacco use: Secondary | ICD-10-CM | POA: Diagnosis present

## 2019-07-05 DIAGNOSIS — Z79899 Other long term (current) drug therapy: Secondary | ICD-10-CM

## 2019-07-05 DIAGNOSIS — K589 Irritable bowel syndrome without diarrhea: Secondary | ICD-10-CM | POA: Diagnosis present

## 2019-07-05 DIAGNOSIS — Z4659 Encounter for fitting and adjustment of other gastrointestinal appliance and device: Secondary | ICD-10-CM

## 2019-07-05 DIAGNOSIS — K859 Acute pancreatitis without necrosis or infection, unspecified: Secondary | ICD-10-CM

## 2019-07-05 DIAGNOSIS — K862 Cyst of pancreas: Secondary | ICD-10-CM | POA: Diagnosis present

## 2019-07-05 DIAGNOSIS — K8501 Idiopathic acute pancreatitis with uninfected necrosis: Principal | ICD-10-CM

## 2019-07-05 DIAGNOSIS — J90 Pleural effusion, not elsewhere classified: Secondary | ICD-10-CM

## 2019-07-05 DIAGNOSIS — Z9889 Other specified postprocedural states: Secondary | ICD-10-CM

## 2019-07-05 DIAGNOSIS — D72829 Elevated white blood cell count, unspecified: Secondary | ICD-10-CM

## 2019-07-05 DIAGNOSIS — R000 Tachycardia, unspecified: Secondary | ICD-10-CM | POA: Diagnosis not present

## 2019-07-05 DIAGNOSIS — Z888 Allergy status to other drugs, medicaments and biological substances status: Secondary | ICD-10-CM

## 2019-07-05 DIAGNOSIS — K861 Other chronic pancreatitis: Secondary | ICD-10-CM | POA: Diagnosis present

## 2019-07-05 DIAGNOSIS — F40240 Claustrophobia: Secondary | ICD-10-CM | POA: Diagnosis present

## 2019-07-05 DIAGNOSIS — Z0189 Encounter for other specified special examinations: Secondary | ICD-10-CM

## 2019-07-05 DIAGNOSIS — K863 Pseudocyst of pancreas: Secondary | ICD-10-CM

## 2019-07-05 DIAGNOSIS — E871 Hypo-osmolality and hyponatremia: Secondary | ICD-10-CM | POA: Diagnosis present

## 2019-07-05 LAB — URINALYSIS, ROUTINE W REFLEX MICROSCOPIC
Glucose, UA: NEGATIVE mg/dL
Hgb urine dipstick: NEGATIVE
Ketones, ur: NEGATIVE mg/dL
Leukocytes,Ua: NEGATIVE
Nitrite: NEGATIVE
Protein, ur: 30 mg/dL — AB
Specific Gravity, Urine: >1.03 — ABNORMAL HIGH (ref 1.005–1.030)
pH: 5.5 (ref 5.0–8.0)

## 2019-07-05 LAB — GLUCOSE, PLEURAL OR PERITONEAL FLUID: Glucose, Fluid: 131 mg/dL

## 2019-07-05 LAB — CBC
HCT: 42.5 % (ref 39.0–52.0)
Hemoglobin: 13.3 g/dL (ref 13.0–17.0)
MCH: 27.5 pg (ref 26.0–34.0)
MCHC: 31.3 g/dL (ref 30.0–36.0)
MCV: 87.8 fL (ref 80.0–100.0)
Platelets: 1238 10*3/uL (ref 150–400)
RBC: 4.84 MIL/uL (ref 4.22–5.81)
RDW: 13.4 % (ref 11.5–15.5)
WBC: 28.4 10*3/uL — ABNORMAL HIGH (ref 4.0–10.5)
nRBC: 0 % (ref 0.0–0.2)

## 2019-07-05 LAB — C-REACTIVE PROTEIN: CRP: 13.7 mg/dL — ABNORMAL HIGH (ref <1.0)

## 2019-07-05 LAB — LACTATE DEHYDROGENASE: LDH: 188 U/L (ref 98–192)

## 2019-07-05 LAB — BODY FLUID CELL COUNT WITH DIFFERENTIAL
Eos, Fluid: 3 %
Lymphs, Fluid: 50 %
Monocyte-Macrophage-Serous Fluid: 12 % — ABNORMAL LOW (ref 50–90)
Neutrophil Count, Fluid: 35 % — ABNORMAL HIGH (ref 0–25)
Total Nucleated Cell Count, Fluid: 2862 cu mm — ABNORMAL HIGH (ref 0–1000)

## 2019-07-05 LAB — LACTATE DEHYDROGENASE, PLEURAL OR PERITONEAL FLUID: LD, Fluid: 178 U/L — ABNORMAL HIGH (ref 3–23)

## 2019-07-05 LAB — COMPREHENSIVE METABOLIC PANEL
ALT: 33 U/L (ref 0–44)
AST: 24 U/L (ref 15–41)
Albumin: 2.3 g/dL — ABNORMAL LOW (ref 3.5–5.0)
Alkaline Phosphatase: 146 U/L — ABNORMAL HIGH (ref 38–126)
Anion gap: 13 (ref 5–15)
BUN: 9 mg/dL (ref 6–20)
CO2: 27 mmol/L (ref 22–32)
Calcium: 9.1 mg/dL (ref 8.9–10.3)
Chloride: 91 mmol/L — ABNORMAL LOW (ref 98–111)
Creatinine, Ser: 0.88 mg/dL (ref 0.61–1.24)
GFR calc Af Amer: >60 mL/min (ref >60)
GFR calc non Af Amer: >60 mL/min (ref >60)
Glucose, Bld: 165 mg/dL — ABNORMAL HIGH (ref 70–99)
Potassium: 3.8 mmol/L (ref 3.5–5.1)
Sodium: 131 mmol/L — ABNORMAL LOW (ref 135–145)
Total Bilirubin: 0.4 mg/dL (ref 0.3–1.2)
Total Protein: 7.9 g/dL (ref 6.5–8.1)

## 2019-07-05 LAB — TROPONIN I (HIGH SENSITIVITY)
Troponin I (High Sensitivity): 5 ng/L (ref <18)
Troponin I (High Sensitivity): 8 ng/L (ref <18)

## 2019-07-05 LAB — PROTEIN, PLEURAL OR PERITONEAL FLUID: Total protein, fluid: 4.4 g/dL

## 2019-07-05 LAB — FERRITIN: Ferritin: 939 ng/mL — ABNORMAL HIGH (ref 24–336)

## 2019-07-05 LAB — LACTIC ACID, PLASMA
Lactic Acid, Venous: 1.7 mmol/L (ref 0.5–1.9)
Lactic Acid, Venous: 1.8 mmol/L (ref 0.5–1.9)

## 2019-07-05 LAB — PREALBUMIN: Prealbumin: 6 mg/dL — ABNORMAL LOW (ref 18–38)

## 2019-07-05 LAB — URINALYSIS, MICROSCOPIC (REFLEX)

## 2019-07-05 LAB — PROTIME-INR
INR: 1.2 (ref 0.8–1.2)
Prothrombin Time: 15.3 seconds — ABNORMAL HIGH (ref 11.4–15.2)

## 2019-07-05 LAB — AMYLASE: Amylase: 1026 U/L — ABNORMAL HIGH (ref 28–100)

## 2019-07-05 LAB — BRAIN NATRIURETIC PEPTIDE: B Natriuretic Peptide: 98.7 pg/mL (ref 0.0–100.0)

## 2019-07-05 LAB — D-DIMER, QUANTITATIVE: D-Dimer, Quant: 9.61 ug/mL-FEU — ABNORMAL HIGH (ref 0.00–0.50)

## 2019-07-05 LAB — APTT: aPTT: 32 seconds (ref 24–36)

## 2019-07-05 LAB — SARS CORONAVIRUS 2 BY RT PCR (HOSPITAL ORDER, PERFORMED IN ~~LOC~~ HOSPITAL LAB): SARS Coronavirus 2: NEGATIVE

## 2019-07-05 LAB — FIBRINOGEN: Fibrinogen: 555 mg/dL — ABNORMAL HIGH (ref 210–475)

## 2019-07-05 LAB — TRIGLYCERIDES: Triglycerides: 139 mg/dL (ref <150)

## 2019-07-05 LAB — LIPASE, BLOOD: Lipase: 329 U/L — ABNORMAL HIGH (ref 11–51)

## 2019-07-05 LAB — PROCALCITONIN: Procalcitonin: 0.4 ng/mL

## 2019-07-05 MED ORDER — MAGIC MOUTHWASH
15.0000 mL | Freq: Four times a day (QID) | ORAL | Status: DC | PRN
  Filled 2019-07-05: qty 15

## 2019-07-05 MED ORDER — SODIUM CHLORIDE 0.9 % IV BOLUS (SEPSIS)
500.0000 mL | Freq: Once | INTRAVENOUS | Status: AC
  Administered 2019-07-05: 500 mL via INTRAVENOUS

## 2019-07-05 MED ORDER — SODIUM CHLORIDE 0.9 % IV SOLN
2.0000 g | Freq: Once | INTRAVENOUS | Status: AC
  Administered 2019-07-05: 2 g via INTRAVENOUS
  Filled 2019-07-05: qty 2

## 2019-07-05 MED ORDER — DIPHENHYDRAMINE HCL 50 MG/ML IJ SOLN: 12.5000 mg | Freq: Four times a day (QID) | INTRAMUSCULAR | Status: DC | PRN

## 2019-07-05 MED ORDER — SODIUM CHLORIDE (PF) 0.9 % IJ SOLN
INTRAMUSCULAR | Status: AC
  Administered 2019-07-05: 08:00:00
  Filled 2019-07-05: qty 50

## 2019-07-05 MED ORDER — PHENOL 1.4 % MT LIQD: 2.0000 | OROMUCOSAL | Status: DC | PRN

## 2019-07-05 MED ORDER — ACETAMINOPHEN 650 MG RE SUPP: 650.0000 mg | Freq: Four times a day (QID) | RECTAL | Status: DC | PRN

## 2019-07-05 MED ORDER — IOHEXOL 350 MG/ML SOLN
100.0000 mL | Freq: Once | INTRAVENOUS | Status: AC | PRN
  Administered 2019-07-05: 100 mL via INTRAVENOUS

## 2019-07-05 MED ORDER — HYDROMORPHONE HCL 1 MG/ML IJ SOLN
1.0000 mg | INTRAMUSCULAR | Status: DC | PRN
  Administered 2019-07-05 (×2): 1 mg via INTRAVENOUS
  Filled 2019-07-05 (×2): qty 1

## 2019-07-05 MED ORDER — NALOXONE HCL 0.4 MG/ML IJ SOLN: 0.4000 mg | INTRAMUSCULAR | Status: DC | PRN

## 2019-07-05 MED ORDER — SODIUM CHLORIDE 0.9 % IV SOLN
1.0000 g | Freq: Three times a day (TID) | INTRAVENOUS | Status: DC
  Administered 2019-07-05 – 2019-07-15 (×29): 1 g via INTRAVENOUS
  Filled 2019-07-05 (×31): qty 1

## 2019-07-05 MED ORDER — SODIUM CHLORIDE 0.9% FLUSH: 9.0000 mL | INTRAVENOUS | Status: DC | PRN

## 2019-07-05 MED ORDER — SODIUM CHLORIDE 0.9% FLUSH
3.0000 mL | Freq: Once | INTRAVENOUS | Status: AC
  Administered 2019-07-05: 3 mL via INTRAVENOUS

## 2019-07-05 MED ORDER — DIPHENHYDRAMINE HCL 12.5 MG/5ML PO ELIX: 12.5000 mg | ORAL_SOLUTION | Freq: Four times a day (QID) | ORAL | Status: DC | PRN

## 2019-07-05 MED ORDER — ACETAMINOPHEN 325 MG PO TABS: 650.0000 mg | ORAL_TABLET | Freq: Four times a day (QID) | ORAL | Status: DC | PRN

## 2019-07-05 MED ORDER — METOPROLOL TARTRATE 25 MG PO TABS
25.0000 mg | ORAL_TABLET | Freq: Two times a day (BID) | ORAL | Status: DC
  Administered 2019-07-05 – 2019-07-14 (×20): 25 mg via ORAL
  Filled 2019-07-05 (×20): qty 1

## 2019-07-05 MED ORDER — VANCOMYCIN HCL 10 G IV SOLR
1500.0000 mg | Freq: Once | INTRAVENOUS | Status: AC
  Administered 2019-07-05: 1500 mg via INTRAVENOUS
  Filled 2019-07-05: qty 1500

## 2019-07-05 MED ORDER — ONDANSETRON HCL 4 MG/2ML IJ SOLN: 4.0000 mg | Freq: Four times a day (QID) | INTRAMUSCULAR | Status: DC | PRN

## 2019-07-05 MED ORDER — POLYETHYLENE GLYCOL 3350 17 G PO PACK: 17.0000 g | PACK | Freq: Every day | ORAL | Status: DC | PRN

## 2019-07-05 MED ORDER — MORPHINE SULFATE 2 MG/ML IV SOLN
INTRAVENOUS | Status: DC
  Administered 2019-07-05 (×2): via INTRAVENOUS
  Administered 2019-07-05: 21 mg via INTRAVENOUS
  Administered 2019-07-06: 1.5 mg via INTRAVENOUS
  Administered 2019-07-06: 10.5 mg via INTRAVENOUS
  Administered 2019-07-06: 7.5 mg via INTRAVENOUS
  Administered 2019-07-06: 4.5 mg via INTRAVENOUS
  Administered 2019-07-06: 21:00:00 via INTRAVENOUS
  Administered 2019-07-06: 7.5 mg via INTRAVENOUS
  Administered 2019-07-07: 9 mg via INTRAVENOUS
  Administered 2019-07-07: 15 mg via INTRAVENOUS
  Administered 2019-07-07: 10:00:00 11.73 mg via INTRAVENOUS
  Administered 2019-07-07: 6 mg via INTRAVENOUS
  Administered 2019-07-07: 10.5 mg via INTRAVENOUS
  Administered 2019-07-07: 16.27 mg via INTRAVENOUS
  Administered 2019-07-07: 13.5 mg via INTRAVENOUS
  Administered 2019-07-08: 0 mg via INTRAVENOUS
  Administered 2019-07-08: 18 mg via INTRAVENOUS
  Administered 2019-07-08: 2.84 mg via INTRAVENOUS
  Administered 2019-07-08: 21 mg via INTRAVENOUS
  Administered 2019-07-08: 12 mg via INTRAVENOUS
  Administered 2019-07-09: 18 mg via INTRAVENOUS
  Administered 2019-07-09: 31.32 mg via INTRAVENOUS
  Administered 2019-07-09: 13:00:00 via INTRAVENOUS
  Administered 2019-07-10: 17 mL via INTRAVENOUS
  Administered 2019-07-10: 11.5 mg via INTRAVENOUS
  Administered 2019-07-10: 16.5 mg via INTRAVENOUS
  Administered 2019-07-10: 4.5 mg via INTRAVENOUS
  Administered 2019-07-10: 3 mg via INTRAVENOUS
  Administered 2019-07-10: 13:00:00 via INTRAVENOUS
  Administered 2019-07-10: 4.5 mL via INTRAVENOUS
  Administered 2019-07-11: 7.5 mg via INTRAVENOUS
  Administered 2019-07-11: 12 mg via INTRAVENOUS
  Administered 2019-07-11: 3 mg via INTRAVENOUS
  Administered 2019-07-11: 13:00:00 via INTRAVENOUS
  Administered 2019-07-11: 6 mg via INTRAVENOUS
  Administered 2019-07-11: 11 mg via INTRAVENOUS
  Administered 2019-07-11: 4 mg via INTRAVENOUS
  Administered 2019-07-12: 15 mg via INTRAVENOUS
  Administered 2019-07-12: 04:00:00 via INTRAVENOUS
  Administered 2019-07-12: 1.5 mg via INTRAVENOUS
  Administered 2019-07-12: 6 mg via INTRAVENOUS
  Administered 2019-07-12: 22:00:00 via INTRAVENOUS
  Administered 2019-07-12: 6 mg via INTRAVENOUS
  Administered 2019-07-12: 16.5 mg via INTRAVENOUS
  Administered 2019-07-12: 20 mg via INTRAVENOUS
  Administered 2019-07-13: 15 mg via INTRAVENOUS
  Administered 2019-07-13: 16.5 mg via INTRAVENOUS
  Administered 2019-07-13: 13.5 mg via INTRAVENOUS
  Administered 2019-07-13: 12 mg via INTRAVENOUS
  Administered 2019-07-13: 12:00:00 via INTRAVENOUS
  Administered 2019-07-13: 22.5 mg via INTRAVENOUS
  Administered 2019-07-13: 10.5 mg via INTRAVENOUS
  Administered 2019-07-14: 21 mg via INTRAVENOUS
  Administered 2019-07-14: 12 mg via INTRAVENOUS
  Administered 2019-07-14 (×2): via INTRAVENOUS
  Administered 2019-07-14: 0 mg via INTRAVENOUS
  Administered 2019-07-14: 6 mg via INTRAVENOUS
  Administered 2019-07-14: 5.19 mg via INTRAVENOUS
  Administered 2019-07-14: 17.3 mg via INTRAVENOUS
  Administered 2019-07-15: 13.5 mg via INTRAVENOUS
  Administered 2019-07-15: 05:00:00 via INTRAVENOUS
  Administered 2019-07-15: 18 mg via INTRAVENOUS
  Filled 2019-07-05 (×2): qty 30
  Filled 2019-07-05: qty 25
  Filled 2019-07-05: qty 30
  Filled 2019-07-05 (×2): qty 25
  Filled 2019-07-05: qty 30
  Filled 2019-07-05: qty 25
  Filled 2019-07-05: qty 30
  Filled 2019-07-05 (×7): qty 25

## 2019-07-05 MED ORDER — HYDROMORPHONE HCL 1 MG/ML IJ SOLN
1.0000 mg | Freq: Once | INTRAMUSCULAR | Status: AC
  Administered 2019-07-05: 08:00:00 1 mg via INTRAVENOUS
  Filled 2019-07-05: qty 1

## 2019-07-05 MED ORDER — MORPHINE SULFATE (PF) 2 MG/ML IV SOLN
2.0000 mg | Freq: Once | INTRAVENOUS | Status: AC | PRN
  Administered 2019-07-05: 2 mg via INTRAVENOUS
  Filled 2019-07-05: qty 1

## 2019-07-05 MED ORDER — LIDOCAINE HCL 1 % IJ SOLN
INTRAMUSCULAR | Status: AC
  Filled 2019-07-05: qty 10

## 2019-07-05 MED ORDER — LACTATED RINGERS IV SOLN
INTRAVENOUS | Status: DC
  Administered 2019-07-05 – 2019-07-06 (×3): via INTRAVENOUS

## 2019-07-05 MED ORDER — METRONIDAZOLE IN NACL 5-0.79 MG/ML-% IV SOLN: 500.0000 mg | Freq: Three times a day (TID) | INTRAVENOUS | Status: DC

## 2019-07-05 MED ORDER — VANCOMYCIN HCL IN DEXTROSE 1-5 GM/200ML-% IV SOLN: 1000.0000 mg | Freq: Once | INTRAVENOUS | Status: DC

## 2019-07-05 MED ORDER — FENTANYL CITRATE (PF) 100 MCG/2ML IJ SOLN
50.0000 ug | Freq: Once | INTRAMUSCULAR | Status: AC
  Administered 2019-07-05: 50 ug via INTRAVENOUS
  Filled 2019-07-05: qty 2

## 2019-07-05 MED ORDER — SODIUM CHLORIDE 0.9 % IV SOLN: 2.0000 g | Freq: Three times a day (TID) | INTRAVENOUS | Status: DC

## 2019-07-05 MED ORDER — FAMOTIDINE IN NACL 20-0.9 MG/50ML-% IV SOLN
20.0000 mg | Freq: Two times a day (BID) | INTRAVENOUS | Status: DC
  Administered 2019-07-05 – 2019-07-14 (×19): 20 mg via INTRAVENOUS
  Filled 2019-07-05 (×20): qty 50

## 2019-07-05 MED ORDER — LIP MEDEX EX OINT
1.0000 "application " | TOPICAL_OINTMENT | Freq: Two times a day (BID) | CUTANEOUS | Status: DC
  Administered 2019-07-05 – 2019-07-14 (×19): 1 via TOPICAL
  Filled 2019-07-05 (×5): qty 7

## 2019-07-05 MED ORDER — MENTHOL 3 MG MT LOZG
1.0000 | LOZENGE | OROMUCOSAL | Status: DC | PRN
  Filled 2019-07-05: qty 9

## 2019-07-05 MED ORDER — ENOXAPARIN SODIUM 40 MG/0.4ML ~~LOC~~ SOLN
40.0000 mg | SUBCUTANEOUS | Status: DC
  Administered 2019-07-08: 40 mg via SUBCUTANEOUS
  Filled 2019-07-05 (×7): qty 0.4

## 2019-07-05 MED ORDER — SODIUM CHLORIDE 0.9 % IV BOLUS (SEPSIS)
1000.0000 mL | Freq: Once | INTRAVENOUS | Status: AC
  Administered 2019-07-05: 1000 mL via INTRAVENOUS

## 2019-07-05 MED ORDER — GUAIFENESIN ER 600 MG PO TB12
600.0000 mg | ORAL_TABLET | Freq: Two times a day (BID) | ORAL | Status: DC
  Administered 2019-07-05 – 2019-07-14 (×20): 600 mg via ORAL
  Filled 2019-07-05 (×20): qty 1

## 2019-07-05 NOTE — ED Notes (Signed)
CRITICAL VALUE ALERT  Critical Value:  Plt 1,238  Date & Time Notied:  07/05/19 0520  Provider Notified: Rolland Porter  Orders Received/Actions taken: EDP informed

## 2019-07-05 NOTE — ED Notes (Signed)
Attempted to give report. RN will call back in 5 min.

## 2019-07-05 NOTE — ED Triage Notes (Signed)
Per EMS, Pt called EMS due to EMS due to chest pain. Pt was given baby Asprin and nitro in response. Pt states pain decreased after nitro administration. Pt also stated he had lost of taste, fever, therefor covid precautions were taken. Upon further investigation pt was then complaining of pain in right lower quadrant, tenderness noted. Pt does still have his appendix. Pt is sinus tach on the monitor.

## 2019-07-05 NOTE — Consult Note (Signed)
Referring Provider:  Dr. Fayrene Helper Primary Care Physician:  Patient, No Pcp Per Primary Gastroenterologist:  [name] (seen by [facility] 2 wks ago, is followed by [facility] in [place] and at [facility])  Reason for Consultation:   Recurrent pancreatitis  HPI: Kyle Mooney is a 48 y.o. male with a tragic, devastating history of idiopathic pancreatitis (no alcohol, status post cholecystectomy, LFTs normal) who has been followed in the past at [facility] in [place] and at [facility] (where he is due to be seen a week from now), released from this hospital 2 weeks ago following a recurrent attack of pancreatitis with large, organizing fluid collections up to 15 cm in diameter, and a large left pleural effusion necessitating thoracentesis.    With that background, he was doing well from the time of discharge until last night when he had the abrupt onset of pain in his chest going up into his neck, different in location from his previous pancreatitis pain.  He really did not have abdominal pain, nausea, or vomiting, and he was eating up until yesterday.  He presented to the emergency room, where his white count was noted to be 28,000, he had a markedly elevated d-dimer, but chest CT was negative for pulmonary embolism and abdominal CT showed evidence of recurrent pancreatitis (lipase 329) with large organizing fluid collections.   Past Medical History:  Diagnosis Date  . Ex-cigarette smoker 04/2019  . GERD (gastroesophageal reflux disease)   . IBS (irritable bowel syndrome)   . Idiopathic pancreatitis     Past Surgical History:  Procedure Laterality Date  . CHOLECYSTECTOMY    . IR THORACENTESIS ASP PLEURAL SPACE W/IMG GUIDE  06/26/2019    Prior to Admission medications   Medication Sig Start Date End Date Taking? Authorizing Provider  acetaminophen (TYLENOL) 650 MG CR tablet Take 325 mg by mouth every 8 (eight) hours as needed for  pain.   Yes [provider]  amoxicillin-clavulanate (AUGMENTIN) 875-125 MG tablet Take 1 tablet by mouth 2 (two) times daily for 7 days. 06/28/19 07/05/19 Yes [place] Eric J, DO  dexlansoprazole (DEXILANT) 60 MG capsule Take 60 mg by mouth daily.   Yes [provider]  dicyclomine (BENTYL) 10 MG capsule Take 10 mg by mouth 3 (three) times daily. 04/10/19  Yes [provider]  metoprolol tartrate (LOPRESSOR) 25 MG tablet Take 1 tablet (25 mg total) by mouth 2 (two) times daily. 06/28/19 06/27/20 Yes [place] Donnamarie Poag, DO  Multiple Vitamin (MULTIVITAMIN WITH MINERALS) TABS tablet Take 1 tablet by mouth daily.   Yes [provider]  ondansetron (ZOFRAN) 4 MG tablet Take 1 tablet (4 mg total) by mouth daily as needed for nausea or vomiting. 06/28/19 06/27/20 Yes [place] Eric J, DO  oxyCODONE 10 MG TABS Take 1 tablet (10 mg total) by mouth every 4 (four) hours as needed for up to 7 days for moderate pain. 06/28/19 07/05/19 Yes [place] Eric J, DO  polyethylene glycol (MIRALAX / GLYCOLAX) 17 g packet Take 17 g by mouth 2 (two) times daily.   Yes [provider]  psyllium (REGULOID) 0.52 g capsule Take 0.52 g by mouth daily.   Yes [provider]  furosemide (LASIX) 40 MG tablet Take 1 tablet (40 mg total) by mouth daily for 5 days. 06/28/19 07/03/19  [place] Donnamarie Poag, DO  potassium chloride (K-DUR) 10 MEQ tablet Take 1 tablet (10 mEq total) by mouth daily for 5 days. 06/28/19 07/03/19  [place] Donnamarie Poag, DO    Current Facility-Administered Medications  Medication Dose Route Frequency Provider Last Rate Last Dose  . acetaminophen (TYLENOL) tablet 650 mg  650 mg Oral Q6H PRN Elodia Florence., MD       Or  . acetaminophen (TYLENOL) suppository 650 mg  650 mg Rectal Q6H PRN Elodia Florence., MD      . enoxaparin (LOVENOX) injection 40 mg  40 mg Subcutaneous Q24H Elodia Florence., MD      . famotidine (PEPCID) IVPB 20 mg premix  20 mg Intravenous Q12H Elodia Florence., MD      . HYDROmorphone (DILAUDID) injection 1 mg  1 mg Intravenous Q2H PRN Elodia Florence., MD   1 mg at 07/05/19 0848  . lactated ringers infusion   Intravenous Continuous Elodia Florence., MD      . meropenem [facility]) 1 g in sodium chloride 0.9 % 100 mL IVPB  1 g Intravenous Tor Netters, MD      . metoprolol tartrate (LOPRESSOR) tablet 25 mg  25 mg Oral BID Elodia Florence., MD      . polyethylene glycol [facility] / GLYCOLAX) packet 17 g  17 g Oral Daily PRN Elodia Florence., MD        Allergies as of 07/05/2019 - Review Complete 07/05/2019  Allergen Reaction Noted  . Codeine Rash 07/15/2017  . Other Other (See Comments) 06/20/2019  . Fluphenazine hcl Rash 06/02/2019    History reviewed. No pertinent family history.  Social History   Socioeconomic History  . Marital status: Legally Separated    Spouse name: Not on file  . Number of children: Not on file  . Years of education: Not on file  . Highest education level: Not on file  Occupational History  . Not on file  Social Needs  . Financial resource strain: Not on file  . Food insecurity    Worry: Not on file    Inability: Not on file  . Transportation needs    Medical: Not on file    Non-medical: Not on file  Tobacco Use  . Smoking status: Former Smoker    Types: Cigarettes    Quit date: 04/20/2019    Years since quitting: 0.2  . Smokeless tobacco: Never Used  Substance and Sexual Activity  . Alcohol use: Never    Frequency: Never  . Drug use: Never  . Sexual activity: Not on file  Lifestyle  . Physical activity    Days per week: Not on file    Minutes per session: Not on file  . Stress: Not on file  Relationships  . Social Herbalist on phone: Not on file    Gets together: Not on file    Attends religious service: Not on file    Active member of club or organization: Not on file    Attends meetings of clubs or organizations: Not on file    Relationship  status: Not on file  . Intimate partner violence    Fear of current or ex partner: Not on file    Emotionally abused: Not on file    Physically abused: Not on file    Forced sexual activity: Not on file  Other Topics Concern  . Not on file  Social History Narrative  . Not on file    Review of Systems: Diarrhea last night, none today.  No obvious pleuritic character  to his chest pain.  He has had dyspnea on moderate exertion for months.  Physical Exam: Vital signs in last 24 hours: Temp:  [97.8 F (36.6 C)-98.3 F (36.8 C)] 97.8 F (36.6 C) (08/16 0712) Pulse Rate:  [118-132] 132 (08/16 1000) Resp:  [19-51] 33 (08/16 1000) BP: (133-172)/(98-118) 137/117 (08/16 1000) SpO2:  [92 %-100 %] 100 % (08/16 1000) Weight:  [77.1 kg] 77.1 kg (08/16 0244)   A thin Caucasian gentleman who appears slightly uncomfortable, slightly anxious, and is tachypneic with shallow breaths but not in frank respiratory distress.  He is able to speak without significant difficulty.  He is coherent, and mood seems normal.  No evident focal neurologic deficits.  Chest has significantly diminished breath sounds on the left side consistent with the known large left pleural effusion.  Right lung has a few scattered crackles at the right base.  Abdomen is firm but nondistended, and quiet.  No evident tenderness.  No peripheral edema, no jaundice, no skin rashes.  Intake/Output from previous day: No intake/output data recorded. Intake/Output this shift: Total I/O In: 1700.2 [IV Piggyback:1700.2] Out: -   Lab Results: Recent Labs    07/05/19 0420  WBC 28.4*  HGB 13.3  HCT 42.5  PLT 1,238*   BMET Recent Labs    07/05/19 0420  NA 131*  K 3.8  CL 91*  CO2 27  GLUCOSE 165*  BUN 9  CREATININE 0.88  CALCIUM 9.1   LFT Recent Labs    07/05/19 0420  PROT 7.9  ALBUMIN 2.3*  AST 24  ALT 33  ALKPHOS 146*  BILITOT 0.4   PT/INR Recent Labs    07/05/19 0420  LABPROT 15.3*  INR 1.2     Studies/Results: Ct Angio Chest Pe W/cm &/or Wo Cm  Result Date: 07/05/2019 CLINICAL DATA:  Chest pain.  Complicated pancreatitis. EXAM: CT ANGIOGRAPHY CHEST WITH CONTRAST TECHNIQUE: Multidetector CT imaging of the chest was performed using the standard protocol during bolus administration of intravenous contrast. Multiplanar CT image reconstructions and MIPs were obtained to evaluate the vascular anatomy. CONTRAST:  114mL OMNIPAQUE IOHEXOL 350 MG/ML SOLN COMPARISON:  Chest CT from 10 days ago FINDINGS: Cardiovascular: Satisfactory opacification of the pulmonary arteries to the segmental level. No evidence of pulmonary embolism when accounting for intermittent motion artifact. Normal heart size. No pericardial effusion. Aberrant right subclavian artery Mediastinum/Nodes: Negative for adenopathy or mass. Lungs/Pleura: Large left pleural effusion that is layering. Trace right pleural fluid. Near complete collapse of the left lower lobe. No consolidation or edema. Upper Abdomen: As reported separately. Musculoskeletal: No acute or aggressive finding Review of the MIP images confirms the above findings. IMPRESSION: 1. Negative for pulmonary embolism. 2. Large layering left pleural effusion with near complete lower lobe collapse. Electronically Signed   By: Monte Fantasia M.D.   On: 07/05/2019 07:16   Dg Chest Portable 1 View  Result Date: 07/05/2019 CLINICAL DATA:  Chest pain. EXAM: PORTABLE CHEST 1 VIEW COMPARISON:  Radiograph 06/26/2019, CT 06/25/2019 FINDINGS: Re-accumulation of left pleural effusion after thoracentesis. Pleural effusion is moderate in size with associated underlying left lung base atelectasis/consolidation. Possible small right pleural effusion. Unchanged heart size and mediastinal contours. Increasing patchy right lung base opacity. No pneumothorax. IMPRESSION: 1. Re-accumulation of left pleural effusion after thoracentesis, now moderate in size. Associated left lung base  atelectasis/consolidation. 2. Possible small right pleural effusion. Increasing right lung base opacity may be atelectasis or pneumonia. Electronically Signed   By: Aurther Loft.D.  On: 07/05/2019 03:22   Ct Renal Stone Study  Result Date: 07/05/2019 CLINICAL DATA:  Chest pain. EXAM: CT ABDOMEN AND PELVIS WITHOUT CONTRAST TECHNIQUE: Multidetector CT imaging of the abdomen and pelvis was performed following the standard protocol without IV contrast. COMPARISON:  Fifteen days ago FINDINGS: Lower chest: At least moderate left pleural effusion with multi segment atelectasis. Pending chest CT. Hepatobiliary: No focal liver abnormality.Cholecystectomy. Pancreas: History of recurrent pancreatitis. Progressive organized collections: 1. 8 x 4 cm along the upper greater curvature of the stomach 2. 6 cm along the mid greater curvature of the stomach-which may be connected to #1. 3. Peripancreatic to splenic tail, 15 x 4 by up to 6 cm-with further extent tracking inferiorly along the descending colon. 4. Ligamentum venosum of the liver measuring 5 x 3 cm 5. Ventral midline abdomen at 5 cm 6. Lower ventral midline abdomen with interloop distortion, 9 cm. These collections exert mass effect on the stomach, transverse colon, and descending colon. Fat density with rim of stranding posterior to the left kidney in the posterior pararenal space, stable in compatible with fat necrosis. There is generalized retroperitoneal edema about the collections and pancreas. No gas within any of the collections to suggest fistula or definite superinfection. Spleen: Stable size and density. Adrenals/Urinary Tract: Negative adrenals. No hydronephrosis or stone. Unremarkable bladder. Stomach/Bowel: No obstruction. Low-density about the hepatic flexure is likely a combination of reactive wall thickening and pericolonic fluid. Vascular/Lymphatic: No visible acute vascular abnormality. No noted adenopathy. Reproductive:Negative Other: Right  upper quadrant ascites which may be loculated. Musculoskeletal: No acute abnormalities. IMPRESSION: 1. Recent pancreatitis with multiple organized fluid collections in the abdomen as listed and measured above. The largest measures 15 x 4 x 6 cm. Collections exert mass effect on the stomach, transverse colon, descending colon without obstruction. 2. Stable left posterior pararenal fat necrosis. 3. At least moderate left pleural effusion with multi segment atelectasis. Electronically Signed   By: Monte Fantasia M.D.   On: 07/05/2019 07:12    Impression: 1.  Recurrent pancreatitis with atypical manifestations in terms of location of pain 2.  History of idiopathic pancreatitis 3.  Organizing fluid collections in the area of the pancreas 4.   Recurrent large left pleural effusion 5.  Markedly elevated d-dimer level with chest CT for pulmonary embolism negative 6.  Transient diarrhea last night  Plan: 1.  Agree with planned supportive care including empiric antibiotic therapy given severe leukocytosis, pain medication as needed, and thoracentesis  2.  Given the severity and recurrent nature of this patient's pancreatitis, and the associated complications, he will need management at a [facility] to continue to discuss more aggressive options for treatment such as possible distal pancreatectomy 3.  Check IgG4 level to help be sure that this is not autoimmune pancreatitis   LOS: 0 days   [place]  07/05/2019, 10:25 AM   Pager [phone] If no answer or after 5 PM call [phone]

## 2019-07-05 NOTE — ED Provider Notes (Signed)
[facility] DEPT Provider Note   CSN: [id number] Arrival date & time: 07/05/19  0221  Time seen 3:07 AM  History   Chief Complaint Chief Complaint  Patient presents with   Chest Pain   Abdominal Pain    HPI Kyle Mooney is a 48 y.o. male.     HPI patient was admitted August 1 through August 9 for acute pancreatitis, he also had 1.3 L of fluid removed from his left lung for shortness of breath.  He states he had swelling in his legs while in the hospital.  He states he has been doing well since he was discharged from the hospital however a few hours ago he got up to use the bathroom and he had acute onset of pain in the center of his chest and in the left side that radiates into his right neck.  He describes as pressure.  He also complains of epigastric abdominal pain and puts his hand in the same place where he had the chest pain.  He also complains of pain in his right groin and into his right testicle.  He denies any swelling.  He states the pain feels like a pancreas flare but it is in the wrong place.  He had nausea without vomiting, he states he did have some watery diarrhea tonight.  He states he feels short of breath.  He states he feels like he needs to cough but he cannot.  He states he is never had this before.  He states he quit smoking 6 weeks ago.  He denies any alcohol use.  He denies any prior history of heart problems.  He has an appointment at [facility] with the GI department on the 24th for his pancreatitis which he states started in May.  Prior to that he had no medical problems.  He states his pancreatitis is idiopathic.  He has had low-grade fever at home.  He states movement makes his abdominal pain worse.  PCP Patient, No Pcp Per   Past Medical History:  Diagnosis Date   Ex-cigarette smoker 04/2019   GERD (gastroesophageal reflux disease)    IBS (irritable bowel syndrome)    Idiopathic pancreatitis     Patient Active  Problem List   Diagnosis Date Noted   Abdominal pain    Acute on chronic pancreatitis (HCC)    Constipation    Chronic pancreatitis ([place]) 06/20/2019   Thrombocytosis ([place]) 06/20/2019   LLL pneumonia ([place]) 06/20/2019   Recurrent left pleural effusion 06/20/2019   GERD (gastroesophageal reflux disease) 06/20/2019   IBS (irritable bowel syndrome) 06/20/2019   Pancreatitis, recurrent 06/20/2019    Past Surgical History:  Procedure Laterality Date   CHOLECYSTECTOMY     IR THORACENTESIS ASP PLEURAL SPACE W/IMG GUIDE  06/26/2019        Home Medications    Prior to Admission medications   Medication Sig Start Date End Date Taking? Authorizing Provider  acetaminophen (TYLENOL) 650 MG CR tablet Take 325 mg by mouth every 8 (eight) hours as needed for pain.   Yes [provider]  amoxicillin-clavulanate (AUGMENTIN) 875-125 MG tablet Take 1 tablet by mouth 2 (two) times daily for 7 days. 06/28/19 07/05/19 Yes [place] Eric J, DO  dexlansoprazole (DEXILANT) 60 MG capsule Take 60 mg by mouth daily.   Yes [provider]  dicyclomine (BENTYL) 10 MG capsule Take 10 mg by mouth 3 (three) times daily. 04/10/19  Yes [provider]  metoprolol tartrate (LOPRESSOR) 25 MG  tablet Take 1 tablet (25 mg total) by mouth 2 (two) times daily. 06/28/19 06/27/20 Yes [place] Donnamarie Poag, DO  Multiple Vitamin (MULTIVITAMIN WITH MINERALS) TABS tablet Take 1 tablet by mouth daily.   Yes [provider]  ondansetron (ZOFRAN) 4 MG tablet Take 1 tablet (4 mg total) by mouth daily as needed for nausea or vomiting. 06/28/19 06/27/20 Yes [place] Eric J, DO  oxyCODONE 10 MG TABS Take 1 tablet (10 mg total) by mouth every 4 (four) hours as needed for up to 7 days for moderate pain. 06/28/19 07/05/19 Yes [place] Eric J, DO  polyethylene glycol (MIRALAX / GLYCOLAX) 17 g packet Take 17 g by mouth 2 (two) times daily.   Yes [provider]  psyllium (REGULOID) 0.52 g capsule Take 0.52 g  by mouth daily.   Yes [provider]  furosemide (LASIX) 40 MG tablet Take 1 tablet (40 mg total) by mouth daily for 5 days. 06/28/19 07/03/19  [place] Donnamarie Poag, DO  potassium chloride (K-DUR) 10 MEQ tablet Take 1 tablet (10 mEq total) by mouth daily for 5 days. 06/28/19 07/03/19  [place] Eric J, DO    Family History History reviewed. No pertinent family history.  Social History Social History   Tobacco Use   Smoking status: Former Smoker    Types: Cigarettes    Quit date: 04/20/2019    Years since quitting: 0.2   Smokeless tobacco: Never Used  Substance Use Topics   Alcohol use: Never    Frequency: Never   Drug use: Never     Allergies   Codeine, Other, and Fluphenazine hcl   Review of Systems Review of Systems  All other systems reviewed and are negative.    Physical Exam Updated Vital Signs BP (!) 133/98    Pulse (!) 121    Temp 98.3 F (36.8 C) (Oral)    Resp 19    Ht 6\' 1"  (1.854 m)    Wt 77.1 kg    SpO2 92%    BMI 22.43 kg/m   Vital signs normal except for tachycardia   Physical Exam Vitals signs and nursing note reviewed.  Constitutional:      General: He is in acute distress.     Appearance: Normal appearance. He is well-developed. He is ill-appearing and diaphoretic. He is not toxic-appearing.     Comments: Thin male  HENT:     Head: Normocephalic and atraumatic.     Right Ear: External ear normal.     Left Ear: External ear normal.     Nose: Nose normal. No mucosal edema or rhinorrhea.     Mouth/Throat:     Mouth: Mucous membranes are dry.     Dentition: No dental abscesses.     Pharynx: No uvula swelling.  Eyes:     Extraocular Movements: Extraocular movements intact.     Conjunctiva/sclera: Conjunctivae normal.     Pupils: Pupils are equal, round, and reactive to light.  Neck:     Musculoskeletal: Full passive range of motion without pain, normal range of motion and neck supple.  Cardiovascular:     Rate and Rhythm: Regular rhythm.  Tachycardia present.     Heart sounds: Normal heart sounds. No murmur. No friction rub. No gallop.   Pulmonary:     Effort: Pulmonary effort is normal. No respiratory distress.     Breath sounds: Normal breath sounds. No wheezing, rhonchi or rales.     Comments: Patient has decreased air movement at the bases.  Chest:     Chest wall: No tenderness or crepitus.  Abdominal:     General: Bowel sounds are normal. There is distension.     Palpations: Abdomen is soft.     Tenderness: There is no abdominal tenderness. There is guarding. There is no rebound.    Genitourinary:    Penis: Normal.      Scrotum/Testes: Normal.  Musculoskeletal: Normal range of motion.        General: No tenderness.     Comments: Moves all extremities well.   Skin:    General: Skin is warm.     Coloration: Skin is not pale.     Findings: No erythema or rash.  Neurological:     Mental Status: He is alert and oriented to person, place, and time.     Cranial Nerves: No cranial nerve deficit.  Psychiatric:        Mood and Affect: Mood is not anxious.        Speech: Speech normal.        Behavior: Behavior normal.      ED Treatments / Results  Labs (all labs ordered are listed, but only abnormal results are displayed) Results for orders placed or performed during the hospital encounter of 07/05/19  SARS Coronavirus 2 [facility] order, Performed in [place] hospital lab) Nasopharyngeal Nasopharyngeal Swab   Specimen: Nasopharyngeal Swab  Result Value Ref Range   SARS Coronavirus 2 NEGATIVE NEGATIVE  Lipase, blood  Result Value Ref Range   Lipase 329 (H) 11 - 51 U/L  Comprehensive metabolic panel  Result Value Ref Range   Sodium 131 (L) 135 - 145 mmol/L   Potassium 3.8 3.5 - 5.1 mmol/L   Chloride 91 (L) 98 - 111 mmol/L   CO2 27 22 - 32 mmol/L   Glucose, Bld 165 (H) 70 - 99 mg/dL   BUN 9 6 - 20 mg/dL   Creatinine, Ser 0.88 0.61 - 1.24 mg/dL   Calcium 9.1 8.9 - 10.3 mg/dL   Total Protein 7.9 6.5 -  8.1 g/dL   Albumin 2.3 (L) 3.5 - 5.0 g/dL   AST 24 15 - 41 U/L   ALT 33 0 - 44 U/L   Alkaline Phosphatase 146 (H) 38 - 126 U/L   Total Bilirubin 0.4 0.3 - 1.2 mg/dL   GFR calc non Af Amer >60 >60 mL/min   GFR calc Af Amer >60 >60 mL/min   Anion gap 13 5 - 15  CBC  Result Value Ref Range   WBC 28.4 (H) 4.0 - 10.5 K/uL   RBC 4.84 4.22 - 5.81 MIL/uL   Hemoglobin 13.3 13.0 - 17.0 g/dL   HCT 42.5 39.0 - 52.0 %   MCV 87.8 80.0 - 100.0 fL   MCH 27.5 26.0 - 34.0 pg   MCHC 31.3 30.0 - 36.0 g/dL   RDW 13.4 11.5 - 15.5 %   Platelets 1,238 (HH) 150 - 400 K/uL   nRBC 0.0 0.0 - 0.2 %  Urinalysis, Routine w reflex microscopic  Result Value Ref Range   Color, Urine AMBER (A) YELLOW   APPearance CLOUDY (A) CLEAR   Specific Gravity, Urine >1.030 (H) 1.005 - 1.030   pH 5.5 5.0 - 8.0   Glucose, UA NEGATIVE NEGATIVE mg/dL   Hgb urine dipstick NEGATIVE NEGATIVE   Bilirubin Urine SMALL (A) NEGATIVE   Ketones, ur NEGATIVE NEGATIVE mg/dL   Protein, ur 30 (A) NEGATIVE mg/dL   Nitrite NEGATIVE NEGATIVE   Leukocytes,Ua NEGATIVE  NEGATIVE  Lactic acid, plasma  Result Value Ref Range   Lactic Acid, Venous 1.7 0.5 - 1.9 mmol/L  APTT  Result Value Ref Range   aPTT 32 24 - 36 seconds  Protime-INR  Result Value Ref Range   Prothrombin Time 15.3 (H) 11.4 - 15.2 seconds   INR 1.2 0.8 - 1.2  D-dimer, quantitative  Result Value Ref Range   D-Dimer, Quant 9.61 (H) 0.00 - 0.50 ug/mL-FEU  Procalcitonin  Result Value Ref Range   Procalcitonin 0.40 ng/mL  Lactate dehydrogenase  Result Value Ref Range   LDH 188 98 - 192 U/L  Ferritin  Result Value Ref Range   Ferritin 939 (H) 24 - 336 ng/mL  Triglycerides  Result Value Ref Range   Triglycerides 139 <150 mg/dL  Fibrinogen  Result Value Ref Range   Fibrinogen 555 (H) 210 - 475 mg/dL  C-reactive protein  Result Value Ref Range   CRP 13.7 (H) <1.0 mg/dL  Brain natriuretic peptide  Result Value Ref Range   B Natriuretic Peptide 98.7 0.0 - 100.0  pg/mL  Urinalysis, Microscopic (reflex)  Result Value Ref Range   RBC / HPF 0-5 0 - 5 RBC/hpf   WBC, UA 6-10 0 - 5 WBC/hpf   Bacteria, UA RARE (A) NONE SEEN   Squamous Epithelial / LPF 0-5 0 - 5   Mucus PRESENT    Hyaline Casts, UA PRESENT   Troponin I (High Sensitivity)  Result Value Ref Range   Troponin I (High Sensitivity) 5 <18 ng/L   Laboratory interpretation all normal except thrombocytosis, hyponatremia, low chloride, elevated lipase consistent with pancreatitis, leukocytosis, elevated d-dimer, COVID negative   EKG EKG Interpretation  Date/Time:  Sunday July 05 2019 02:50:52 EDT Ventricular Rate:  122 PR Interval:    QRS Duration: 98 QT Interval:  315 QTC Calculation: 449 R Axis:   20 Text Interpretation:  Sinus tachycardia Minimal ST elevation, inferior leads No significant change since last tracing 24 Jun 2019 Confirmed by Rolland Porter [phone]) on 07/05/2019 3:00:39 AM   Radiology Dg Chest Portable 1 View  Result Date: 07/05/2019 CLINICAL DATA:  Chest pain. EXAM: PORTABLE CHEST 1 VIEW COMPARISON:  Radiograph 06/26/2019, CT 06/25/2019 FINDINGS: Re-accumulation of left pleural effusion after thoracentesis. Pleural effusion is moderate in size with associated underlying left lung base atelectasis/consolidation. Possible small right pleural effusion. Unchanged heart size and mediastinal contours. Increasing patchy right lung base opacity. No pneumothorax. IMPRESSION: 1. Re-accumulation of left pleural effusion after thoracentesis, now moderate in size. Associated left lung base atelectasis/consolidation. 2. Possible small right pleural effusion. Increasing right lung base opacity may be atelectasis or pneumonia. Electronically Signed   By: Keith Rake M.D.   On: 07/05/2019 03:22   Dg Chest 1 View  Result Date: 06/26/2019 CLINICAL DATA:  Status post thoracentesis.. IMPRESSION: Interval near complete evacuation of the left pleural fluid collection with left lower lobe  atelectasis. No postprocedural pneumothorax. Electronically Signed   By: Marijo Sanes M.D.   On: 06/26/2019 09:40    Ct Chest W Contrast  Ct Abdomen Pelvis W Contrast  Result Date: 06/25/2019 CLINICAL DATA:  Shortness of breath, acute on chronic pancreatitis with multiple peripancreatic fluid collections, recurrent epigastric painIMPRESSION: 1. There is a large left pleural effusion with associated atelectasis or consolidation, enlarged compared to prior examination. There is a new, trace right pleural effusion. 2. There are scattered subpleural ground-glass pulmonary opacities, most conspicuous in the right upper lobe. These are nonspecific and infectious or inflammatory. 3. Redemonstrated  extensive retroperitoneal inflammation and multiple peripancreatic fluid collections. There is a new discrete fluid collection or component adjacent to the greater curvature of the stomach and spleen measuring approximately 6.2 x 2.7 cm (series 3, image 52). There is an additional new fluid collection within or adjacent to the omentum measuring 4.7 x 2.5 cm (series 3, image 75). There is an additional new fluid collection within the small bowel mesentery measuring 5.0 x 3.2 cm (series 3, image 86). 4. Other fluid collections are not significantly changed, for example anterior to the pancreatic neck (series 3, image 67), adjacent to the porta hepatis (series 3, image 67), adjacent to the left kidney and splenic flexure (series 3, image 73), and posterior to the left kidney (series 3, image 65). 5. Inflammatory thickening of the transverse colon (series 3, image 81), similar to prior examination, and of the mid small bowel, particularly a segment in the anterior abdomen (series 3, image 95). 6. Extensive anasarca. Small volume ascites, similar to prior examination. Electronically Signed   By: Eddie Candle M.D.   On: 06/25/2019 14:18   Ct Abdomen Pelvis W Contrast  Result Date: 06/20/2019 CLINICAL DATA:  Abdominal pain.   Neutropenia.  IMPRESSION: 1. Overall findings concerning for pancreatitis with multiple loculated fluid collections as detailed above. Some of the smaller collections, for example in the left upper quadrant, demonstrate rim enhancement. An abscess is not excluded. There is no CT evidence for pancreatic necrosis. 2. Moderate-sized left-sided pleural effusion with at least partial collapse of the left lower lobe. 3. Diffuse wall thickening of the transverse colon and splenic flexure favored to be reactive. Other considerations include infectious or inflammatory colitis. 4. Splenomegaly.  The splenic vein remains patent. Electronically Signed   By: Constance Holster M.D.   On: 06/20/2019 21:07     Dg Chest Port 1 View  Result Date: 06/24/2019 CLINICAL DATA:  Chest pain for 1 hour IMPRESSION: Increasing left-sided pleural effusion. Electronically Signed   By: Inez Catalina M.D.   On: 06/24/2019 19:33   Dg Chest Portable 1 View  Result Date: 06/20/2019 CLINICAL DATA:  Chest pain  IMPRESSION: Left lower lobe airspace consolidation consistent with pneumonia. Small left pleural effusion. Lungs elsewhere clear. No adenopathy. Heart size normal. Followup PA and lateral chest radiographs recommended in 3-4 weeks following trial of antibiotic therapy to ensure resolution and exclude underlying malignancy. Electronically Signed   By: Lowella Grip III M.D.   On: 06/20/2019 19:32   Ir Thoracentesis Asp Pleural Space W/img Guide  Result Date: 06/26/2019 INDICATION: Shortness of breath. Left-sided pleural effusion. Request for diagnostic and therapeutic thoracentesis.  IMPRESSION: Successful ultrasound guided left thoracentesis yielding 1.3 L of pleural fluid. Read by: Ascencion Dike PA-C Electronically Signed   By: Markus Daft M.D.   On: 06/26/2019 09:19      Procedures .Critical Care Performed by: Rolland Porter, MD Authorized by: Rolland Porter, MD   Critical care provider statement:    Critical care time  (minutes):  38   Critical care was necessary to treat or prevent imminent or life-threatening deterioration of the following conditions:  Circulatory failure and respiratory failure   Critical care was time spent personally by me on the following activities:  Examination of patient, obtaining history from patient or surrogate, ordering and review of laboratory studies, ordering and review of radiographic studies, pulse oximetry, re-evaluation of patient's condition and review of old charts   (including critical care time)  Medications Ordered in ED Medications  ceFEPIme (  MAXIPIME) 2 g in sodium chloride 0.9 % 100 mL IVPB (has no administration in time range)  vancomycin (VANCOCIN) 1,500 mg in sodium chloride 0.9 % 500 mL IVPB (1,500 mg Intravenous New Bag/Given 07/05/19 0524)  sodium chloride (PF) 0.9 % injection (has no administration in time range)  sodium chloride flush (NS) 0.9 % injection 3 mL (3 mLs Intravenous Given 07/05/19 0455)  sodium chloride flush (NS) 0.9 % injection 3 mL (3 mLs Intravenous Given 07/05/19 0455)  sodium chloride 0.9 % bolus 1,000 mL (1,000 mLs Intravenous New Bag/Given 07/05/19 0556)    And  sodium chloride 0.9 % bolus 1,000 mL (0 mLs Intravenous Stopped 07/05/19 0555)    And  sodium chloride 0.9 % bolus 500 mL (0 mLs Intravenous Stopped 07/05/19 0555)  fentaNYL (SUBLIMAZE) injection 50 mcg (50 mcg Intravenous Given 07/05/19 0454)  iohexol (OMNIPAQUE) 350 MG/ML injection 100 mL (100 mLs Intravenous Contrast Given 07/05/19 6333)     Initial Impression / Assessment and Plan / ED Course  I have reviewed the triage vital signs and the nursing notes.  Pertinent labs & imaging results that were available during my care of the patient were reviewed by me and considered in my medical decision making (see chart for details).    Patient was started on sepsis protocol.  He was given fentanyl for pain.  He was started on antibiotics for hospital-acquired pneumonia.  After  reviewing his elevated d-dimer CTA was added.  CT renal was done to make sure he does not have a renal stone that would account for his right lower quadrant pain going into his testicle.  His testicle was normal.  More than likely he is having persistence of his pancreatitis.    Patient had recent admission for pancreatitis and had left thoracentesis done.  He presents now with acute onset of shortness of breath and recurrence of his left pleural effusion.  He has a different abdominal pain.  He has known pancreatitis since May.  He is followed at [facility].  His x-ray was suggestive of infiltrates and he was started on antibiotics.  Patient was turned over to Dr. Dorie Rank at change of shift.  His CTs are pending.  Most likely he will be admitted.  Review of the [place] shows patient got #30 oxycodone 10 mg tablets on August 9.  He has 3 other narcotic prescriptions since May.  Kyle Mooney was evaluated in Emergency Department on 07/05/2019 for the symptoms described in the history of present illness. He was evaluated in the context of the global COVID-19 pandemic, which necessitated consideration that the patient might be at risk for infection with the SARS-CoV-2 virus that causes COVID-19. Institutional protocols and algorithms that pertain to the evaluation of patients at risk for COVID-19 are in a state of rapid change based on information released by regulatory bodies including the CDC and federal and state organizations. These policies and algorithms were followed during the patient's care in the ED.   Final Clinical Impressions(s) / ED Diagnoses   Final diagnoses:  Acute pancreatitis, unspecified complication status, unspecified pancreatitis type  Pleural effusion on left   Plan admission  Rolland Porter, MD, Barbette Or, MD 07/05/19 [phone]

## 2019-07-05 NOTE — ED Notes (Addendum)
Tomi Bamberger, [place] notified about pt status. Pt diaphoretic, tachy, verbalizing pain.   Waiting for orders

## 2019-07-05 NOTE — H&P (Signed)
History and Physical    Kyle Mooney VHQ:[id number] DOB: 1971/03/29 DOA: 07/05/2019  PCP: Patient, No Pcp Per  Patient coming from: home  I have personally briefly reviewed patient's old medical records in [facility]  Chief Complaint: chest pain  HPI: Kyle Mooney is Kyle Mooney 48 y.o. male with medical history significant of recurrent pancreatitis, GERD, IBS presenting with L sided chest pain and abdominal pain c/w previous episodes of pancreatitis.  He's had multiple hospitalizations for pancreatitis since May 2020.  His most recent discharge was from an admission from 8/1-8/9 for the same.  During that admission he had thoracentesis for L sided pleural effusion thought 2/2 acute pancreatitis.  GI followed and was treated with IVF and morphine PCA.  Discharged on abx.  Prior to this, he was seen at [facility] for his idopathic pancreatitis and had necrotizing pancreatitis during his admission from 7/14-7/23.  He had MRCP which showed acute necrotizing pancreatitis with peripancreatitc fluid collections.  GI was consulted and plan for outpatient follow up with GI and surgery.    He presents today after feeling generally well after discharge until last night.  He developed L sided chest pain, felt like heart attack with numbness in his L arm, shoulder and neck.  His abdominal pain started flaring, felt like it was on fire.  This was epigastric and R sided.  He notes subjective fever, but reports one true fever to 101.  Denies chills, cough.  He notes SOB with pain.  Notes hiccups.  Notes diarrhea since discharge.  Denies smoking or etoh.  ED Course: Labs, imaging, abx, pain meds.  GI and surgery c/s.  Hospitalist admit for pancreatitis and L sided pleural effusion and pain.  Review of Systems: As per HPI otherwise 10 point review of systems negative.   Past Medical History:  Diagnosis Date   Ex-cigarette smoker 04/2019   GERD (gastroesophageal reflux disease)    IBS (irritable bowel  syndrome)    Idiopathic pancreatitis     Past Surgical History:  Procedure Laterality Date   CHOLECYSTECTOMY     IR THORACENTESIS ASP PLEURAL SPACE W/IMG GUIDE  06/26/2019     reports that he quit smoking about 2 months ago. His smoking use included cigarettes. He has never used smokeless tobacco. He reports that he does not drink alcohol or use drugs.  Allergies  Allergen Reactions   Codeine Rash   Other Other (See Comments)    "prolixa, an antidepressant from the 80s"   Fluphenazine Hcl Rash    History reviewed. No pertinent family history.  Prior to Admission medications   Medication Sig Start Date End Date Taking? Authorizing Provider  acetaminophen (TYLENOL) 650 MG CR tablet Take 325 mg by mouth every 8 (eight) hours as needed for pain.   Yes [provider]  amoxicillin-clavulanate (AUGMENTIN) 875-125 MG tablet Take 1 tablet by mouth 2 (two) times daily for 7 days. 06/28/19 07/05/19 Yes [place], Eric J, DO  dexlansoprazole (DEXILANT) 60 MG capsule Take 60 mg by mouth daily.   Yes [provider]  dicyclomine (BENTYL) 10 MG capsule Take 10 mg by mouth 3 (three) times daily. 04/10/19  Yes [provider]  metoprolol tartrate (LOPRESSOR) 25 MG tablet Take 1 tablet (25 mg total) by mouth 2 (two) times daily. 06/28/19 06/27/20 Yes [place], Alvira Philips, DO  Multiple Vitamin (MULTIVITAMIN WITH MINERALS) TABS tablet Take 1 tablet by mouth daily.   Yes [provider]  ondansetron (ZOFRAN) 4 MG tablet Take 1  tablet (4 mg total) by mouth daily as needed for nausea or vomiting. 06/28/19 06/27/20 Yes [place], Eric J, DO  oxyCODONE 10 MG TABS Take 1 tablet (10 mg total) by mouth every 4 (four) hours as needed for up to 7 days for moderate pain. 06/28/19 07/05/19 Yes [place], Eric J, DO  polyethylene glycol (MIRALAX / GLYCOLAX) 17 g packet Take 17 g by mouth 2 (two) times daily.   Yes [provider]  psyllium (REGULOID) 0.52 g capsule Take 0.52 g by mouth daily.    Yes [provider]  furosemide (LASIX) 40 MG tablet Take 1 tablet (40 mg total) by mouth daily for 5 days. 06/28/19 07/03/19  [place], Alvira Philips, DO  potassium chloride (K-DUR) 10 MEQ tablet Take 1 tablet (10 mEq total) by mouth daily for 5 days. 06/28/19 07/03/19  [place], Eric J, DO    Physical Exam: Vitals:   07/05/19 0730 07/05/19 0747 07/05/19 0801 07/05/19 0857  BP: (!) 152/111  (!) 153/117 (!) 172/118  Pulse: (!) 123 (!) 122 (!) 123 (!) 129  Resp: (!) 51 (!) 29 (!) 41 (!) 41  Temp:      TempSrc:      SpO2: 99% 99% 100%   Weight:      Height:        Constitutional: NAD, calm, comfortable Vitals:   07/05/19 0730 07/05/19 0747 07/05/19 0801 07/05/19 0857  BP: (!) 152/111  (!) 153/117 (!) 172/118  Pulse: (!) 123 (!) 122 (!) 123 (!) 129  Resp: (!) 51 (!) 29 (!) 41 (!) 41  Temp:      TempSrc:      SpO2: 99% 99% 100%   Weight:      Height:       Eyes: PERRL, lids and conjunctivae normal ENMT: Mucous membranes are moist. Posterior pharynx clear of any exudate or lesions.Normal dentition.  Neck: normal, supple, no masses, no thyromegaly Respiratory: decreased L sided breath sounds.  Cardiovascular: tachy, regular rate.  No LEE. Abdomen: diffuse TTP, guarding, distended. Musculoskeletal: no clubbing / cyanosis. No joint deformity upper and lower extremities. Good ROM, no contractures. Normal muscle tone.  Skin: no rashes, lesions, ulcers. No induration Neurologic: CN 2-12 grossly intact. Sensation intact.  Moving all extremities.  Psychiatric: Normal judgment and insight. Alert and oriented x 3. Normal mood.   Labs on Admission: I have personally reviewed following labs and imaging studies  CBC: Recent Labs  Lab 07/05/19 0420  WBC 28.4*  HGB 13.3  HCT 42.5  MCV 87.8  PLT 1,238*   Basic Metabolic Panel: Recent Labs  Lab 07/05/19 0420  NA 131*  K 3.8  CL 91*  CO2 27  GLUCOSE 165*  BUN 9  CREATININE 0.88  CALCIUM 9.1   GFR: Estimated Creatinine  Clearance: 113.2 mL/min (by C-G formula based on SCr of 0.88 mg/dL). Liver Function Tests: Recent Labs  Lab 07/05/19 0420  AST 24  ALT 33  ALKPHOS 146*  BILITOT 0.4  PROT 7.9  ALBUMIN 2.3*   Recent Labs  Lab 07/05/19 0420  LIPASE 329*   No results for input(s): AMMONIA in the last 168 hours. Coagulation Profile: Recent Labs  Lab 07/05/19 0420  INR 1.2   Cardiac Enzymes: No results for input(s): CKTOTAL, CKMB, CKMBINDEX, TROPONINI in the last 168 hours. BNP (last 3 results) No results for input(s): PROBNP in the last 8760 hours. HbA1C: No results for input(s): HGBA1C in the last 72 hours. CBG: No results for input(s): GLUCAP in  the last 168 hours. Lipid Profile: Recent Labs    07/05/19 0420  TRIG 139   Thyroid Function Tests: No results for input(s): TSH, T4TOTAL, FREET4, T3FREE, THYROIDAB in the last 72 hours. Anemia Panel: Recent Labs    07/05/19 0420  FERRITIN 939*   Urine analysis:    Component Value Date/Time   COLORURINE AMBER (Kyle Mooney) 07/05/2019 0246   APPEARANCEUR CLOUDY (Niemah Schwebke) 07/05/2019 0246   LABSPEC >1.030 (H) 07/05/2019 0246   PHURINE 5.5 07/05/2019 0246   GLUCOSEU NEGATIVE 07/05/2019 0246   HGBUR NEGATIVE 07/05/2019 0246   BILIRUBINUR SMALL (Kyle Mooney) 07/05/2019 0246   KETONESUR NEGATIVE 07/05/2019 0246   PROTEINUR 30 (Kyle Mooney) 07/05/2019 0246   NITRITE NEGATIVE 07/05/2019 0246   LEUKOCYTESUR NEGATIVE 07/05/2019 0246    Radiological Exams on Admission: Ct Angio Chest Pe W/cm &/or Wo Cm  Result Date: 07/05/2019 CLINICAL DATA:  Chest pain.  Complicated pancreatitis. EXAM: CT ANGIOGRAPHY CHEST WITH CONTRAST TECHNIQUE: Multidetector CT imaging of the chest was performed using the standard protocol during bolus administration of intravenous contrast. Multiplanar CT image reconstructions and MIPs were obtained to evaluate the vascular anatomy. CONTRAST:  OMNIPAQUE IOHEXOL 350 MG/ML SOLN COMPARISON:  Chest CT from 10 days ago FINDINGS: Cardiovascular:  Satisfactory opacification of the pulmonary arteries to the segmental level. No evidence of pulmonary embolism when accounting for intermittent motion artifact. Normal heart size. No pericardial effusion. Aberrant right subclavian artery Mediastinum/Nodes: Negative for adenopathy or mass. Lungs/Pleura: Large left pleural effusion that is layering. Trace right pleural fluid. Near complete collapse of the left lower lobe. No consolidation or edema. Upper Abdomen: As reported separately. Musculoskeletal: No acute or aggressive finding Review of the MIP images confirms the above findings. IMPRESSION: 1. Negative for pulmonary embolism. 2. Large layering left pleural effusion with near complete lower lobe collapse. Electronically Signed   By: Marnee Spring M.D.   On: 07/05/2019 07:16   Dg Chest Portable 1 View  Result Date: 07/05/2019 CLINICAL DATA:  Chest pain. EXAM: PORTABLE CHEST 1 VIEW COMPARISON:  Radiograph 06/26/2019, CT 06/25/2019 FINDINGS: Re-accumulation of left pleural effusion after thoracentesis. Pleural effusion is moderate in size with associated underlying left lung base atelectasis/consolidation. Possible small right pleural effusion. Unchanged heart size and mediastinal contours. Increasing patchy right lung base opacity. No pneumothorax. IMPRESSION: 1. Re-accumulation of left pleural effusion after thoracentesis, now moderate in size. Associated left lung base atelectasis/consolidation. 2. Possible small right pleural effusion. Increasing right lung base opacity may be atelectasis or pneumonia. Electronically Signed   By: Narda Rutherford M.D.   On: 07/05/2019 03:22   Ct Renal Stone Study  Result Date: 07/05/2019 CLINICAL DATA:  Chest pain. EXAM: CT ABDOMEN AND PELVIS WITHOUT CONTRAST TECHNIQUE: Multidetector CT imaging of the abdomen and pelvis was performed following the standard protocol without IV contrast. COMPARISON:  Fifteen days ago FINDINGS: Lower chest: At least moderate left  pleural effusion with multi segment atelectasis. Pending chest CT. Hepatobiliary: No focal liver abnormality.Cholecystectomy. Pancreas: History of recurrent pancreatitis. Progressive organized collections: 1. 8 x 4 cm along the upper greater curvature of the stomach 2. 6 cm along the mid greater curvature of the stomach-which may be connected to #1. 3. Peripancreatic to splenic tail, 15 x 4 by up to 6 cm-with further extent tracking inferiorly along the descending colon. 4. Ligamentum venosum of the liver measuring 5 x 3 cm 5. Ventral midline abdomen at 5 cm 6. Lower ventral midline abdomen with interloop distortion, 9 cm. These collections exert mass effect on the  stomach, transverse colon, and descending colon. Fat density with rim of stranding posterior to the left kidney in the posterior pararenal space, stable in compatible with fat necrosis. There is generalized retroperitoneal edema about the collections and pancreas. No gas within any of the collections to suggest fistula or definite superinfection. Spleen: Stable size and density. Adrenals/Urinary Tract: Negative adrenals. No hydronephrosis or stone. Unremarkable bladder. Stomach/Bowel: No obstruction. Low-density about the hepatic flexure is likely Kyle Mooney combination of reactive wall thickening and pericolonic fluid. Vascular/Lymphatic: No visible acute vascular abnormality. No noted adenopathy. Reproductive:Negative Other: Right upper quadrant ascites which may be loculated. Musculoskeletal: No acute abnormalities. IMPRESSION: 1. Recent pancreatitis with multiple organized fluid collections in the abdomen as listed and measured above. The largest measures 15 x 4 x 6 cm. Collections exert mass effect on the stomach, transverse colon, descending colon without obstruction. 2. Stable left posterior pararenal fat necrosis. 3. At least moderate left pleural effusion with multi segment atelectasis. Electronically Signed   By: Marnee Spring M.D.   On: 07/05/2019  07:12    EKG: Independently reviewed. Sinus tachycardia, appears similar to priors.  Assessment/Plan Principal Problem:   Idiopathic acute pancreatitis with uninfected necrosis Active Problems:   Recurrent left pleural effusion   GERD (gastroesophageal reflux disease)   IBS (irritable bowel syndrome)   Acute on chronic pancreatitis (HCC)   Tobacco abuse   Alcohol dependence, binge pattern (HCC)   History of cholecystectomy   Pancreatitis  Acute on Chronic Pancreatitis: imaging notable for recent pancreatitis with multiple organized fluid collections (largest 15 x 4 x 6 cm).  Lipase elevated to 329.   Appreciate GI consult Surgery has been c/s as well S/p IVF, continue aggressive fluid resuscitation with LR Continue pain management with IV pain meds.  Plan to transition to morphine PCA if unable to control with bolus dosing (he did well with this last admission) Will cover with abx for now.  Vanc/cefepime/flagyl.  Narrow as appropriate.  Recurrent Left Sided Pleural Effusion: this was exudative at last admission with negative cx and cytology.  Suspect this is most likely due to his pancreatitis above given L sided nature in setting of pancreatitis.  Will cover for pneumonia.  Follow sputum culture. Narrow abx as able.  Would d/c vanc if MRSA pcr is negative. IR c/s for thoracentesis  Sinus Tachycardia   Atypical Chest Pain: EKG notable for sinus tachycardia, appears similar to priors.  High sensitivity troponin negative x 2.  Suspect this is due to his severe pain, pancreatitis, effusion above.  Elevated d dimer, negative CT PE protocol for PE. Treat above.  Continue home metoprolol.  Left Posterior Pararenal Fat Necrosis:  GERD: IV H2 blocker  Leukocytosis: cover with abx as noted above.  Follow blood and urine cultures.  Likely reactive due to above.  Thrombocytosis: likely reactive due to pancreatitis? Continue lovenox for dvt ppx.  Continue to monitor closely.  Needs  outpatient f/u.  Diarrhea: maybe related to pancreatitis.  If persistent here, w/u further.  DVT prophylaxis: SCD, lovenox Code Status: full  Family Communication: he declines me calling, he's called his wife  Disposition Plan: pending Consults called: GI, surgery, IR Admission status: inpatient, stepdown. 50 min critical care time with acute on chronic pancreatitis, sinus tachy with atypical CP, intractable pain requiring PCA.   Lacretia Nicks MD [facility] Pager AMION  If 7PM-7AM, please contact night-coverage [email or web address] Password [email or web address]  07/05/2019, 9:11 AM

## 2019-07-05 NOTE — ED Triage Notes (Signed)
Pt was recently discharged from the hospital after an admission for pancreatitis. Pt states this pain is different.

## 2019-07-05 NOTE — ED Notes (Signed)
ONLY ABLE TO GET A BLUE BLOOD CULTURE >>>

## 2019-07-05 NOTE — Consult Note (Addendum)
Kyle Mooney  Feb 06, 1971 [id number]  CARE TEAM:  PCP: Patient, No Pcp Per  Outpatient Care Team: Patient Care Team: Patient, No Pcp Per as PCP - General (General Practice) Ccs, Md, MD as Consulting Physician (General Surgery)  Inpatient Treatment Team: Treatment Team: Attending Provider: Dorie Rank, MD; Technician: Barkley Bruns, NT; Registered Nurse: Katheran Awe, RN   This patient is a 48 y.o.male who presents today for surgical evaluation at the request of Dr Tomi Bamberger.   Chief complaint / Reason for evaluation: Severe pancreatitis with possible necrosis.  Readmission with Sirs  Patient with history of intermittent binge alcohol drinking the past but relatively abstinence who had episode of severe pancreatitis.  He has been managed in [place], [place] [facility], [facility], [facility], and now comes to the [facility] emergency department.  Was admitted [facility] in mid July.  Presumption of idiopathic pancreatitis since the patient already has had a cholecystectomy in the distant past and denied any heavy binge drinking.  MRCP at the time showed acute necrotizing pancreatitis with some peripancreatic fluid collections.  Was admitted at [facility] earlier this month without surgical consultation.  I believe he was due to follow-up with Dr. Carlis Abbott at [facility] to discuss possible surgical management if needed.  Possible follow-up with gastroenterology as well.  Managed by gastroenterology group in [place] usually.  Initial episode seemed to happen in May.  Last admission earlier this month had left-sided pleural effusion with respiratory issues.  Underwent thoracentesis with some improvement.    He was discharged earlier this month and seemed to be improving.  However last night he started to feel much worse.   Worsening left-sided chest pain.  Numbness rating to his left arm.  He was worried about heart attack.  Abdominal  pain as well.  Felt febrile.  Somewhat nauseated with some hiccups but no definite emesis.  Loose bowel movements but no severe change in diarrhea.  While he is a history of alcohol and tobacco usage, claims he has been abstinent from them this month.   Assessment  [name]  48 y.o. male       Problem List:  Principal Problem:   Idiopathic acute pancreatitis with uninfected necrosis Active Problems:   Recurrent left pleural effusion   GERD (gastroesophageal reflux disease)   IBS (irritable bowel syndrome)   Acute on chronic pancreatitis (HCC)   Tobacco abuse   Alcohol dependence, binge pattern (HCC)   History of cholecystectomy   Pancreatitis   Left-sided chest pains with large recurrent effusion.  Severe pancreatitis with numerous pseudocysts  Plan:  -Agree with thoracentesis decompression.  Given evidence of necrosis on prior MRCP at outside institution, reasonable to start antibiotics.  Ideally would have something Carbapenem based.  Would do meropenem based on availability and follow.  Dr. Florene Glen with medicine agrees transitioning from vancomycin and cefepime to meropenem only since patient seems to be MRSA negative.  Thoracentesis done on 7 August grew nothing.  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  Ideally this would be more of an internal drainage as opposed to percutaneous drainage.  He may be a reasonable candidate for a transgastric laparoscopic pseudocyst-gastrostomy.  Ideally wait at least 6 weeks from when they were created before proceeding.  Evidence of formation in June.  They seem more organized in the past  2 weeks compared to the CAT scan earlier this month at [facility].  Sometimes gastroenterology can do endoscopic approach instead.    Despite him claiming that he is eating well, he is cachectic and looks rather malnourished.  Albumin is above  2 at least.  Check prealbumin and follow.  If does not turn around quickly, consider TNA and regroup  I was leaning more towards NG tube but patient denies any nausea or vomiting.  He would like to hold off.  If patient has worsening abdominal pain despite thoracentesis, reconsider.  If he does not improve after thoracentesis or shows evidence of infection, he may require more aggressive surgical debridement.  That would be a more morbid intervention though.  He would like to hold off on any surgery if possible.  -VTE prophylaxis- SCDs, etc. we will definitely recommend anticoagulation as he is at high risk for splenic venous thrombosis with his severe pancreatitis which would be potentially catastrophic  -mobilize as tolerated to help recovery  45 minutes spent in review, evaluation, examination, counseling, and coordination of care.  More than 50% of that time was spent in counseling.  Adin Hector, MD, FACS, MASCRS Gastrointestinal and Minimally Invasive Surgery    [address], [place], [place] [address] [phone] Main / Paging [phone] Fax   07/05/2019      Past Medical History:  Diagnosis Date   Ex-cigarette smoker 04/2019   GERD (gastroesophageal reflux disease)    IBS (irritable bowel syndrome)    Idiopathic pancreatitis     Past Surgical History:  Procedure Laterality Date   CHOLECYSTECTOMY     IR THORACENTESIS ASP PLEURAL SPACE W/IMG GUIDE  06/26/2019    Social History   Socioeconomic History   Marital status: Legally Separated    Spouse name: Not on file   Number of children: Not on file   Years of education: Not on file   Highest education level: Not on file  Occupational History   Not on file  Social Needs   Financial resource strain: Not on file   Food insecurity    Worry: Not on file    Inability: Not on file   Transportation needs    Medical: Not on file    Non-medical: Not on file  Tobacco Use    Smoking status: Former Smoker    Types: Cigarettes    Quit date: 04/20/2019    Years since quitting: 0.2   Smokeless tobacco: Never Used  Substance and Sexual Activity   Alcohol use: Never    Frequency: Never   Drug use: Never   Sexual activity: Not on file  Lifestyle   Physical activity    Days per week: Not on file    Minutes per session: Not on file   Stress: Not on file  Relationships   Social connections    Talks on phone: Not on file    Gets together: Not on file    Attends religious service: Not on file    Active member of club or organization: Not on file    Attends meetings of clubs or organizations: Not on file    Relationship status: Not on file   Intimate partner violence    Fear of current or ex partner: Not on file    Emotionally abused: Not on file    Physically abused: Not on file    Forced sexual activity: Not on file  Other Topics Concern   Not on file  Social History Narrative   Not on file    History reviewed. No pertinent family history.  Current Facility-Administered Medications  Medication Dose Route Frequency Provider Last Rate Last Dose   ceFEPIme (MAXIPIME) 2 g in sodium chloride 0.9 % 100 mL IVPB  2 g Intravenous Once Rolland Porter, MD       Current Outpatient Medications  Medication Sig Dispense Refill   acetaminophen (TYLENOL) 650 MG CR tablet Take 325 mg by mouth every 8 (eight) hours as needed for pain.     amoxicillin-clavulanate (AUGMENTIN) 875-125 MG tablet Take 1 tablet by mouth 2 (two) times daily for 7 days. 14 tablet 0   dexlansoprazole (DEXILANT) 60 MG capsule Take 60 mg by mouth daily.     dicyclomine (BENTYL) 10 MG capsule Take 10 mg by mouth 3 (three) times daily.     metoprolol tartrate (LOPRESSOR) 25 MG tablet Take 1 tablet (25 mg total) by mouth 2 (two) times daily. 60 tablet 2   Multiple Vitamin (MULTIVITAMIN WITH MINERALS) TABS tablet Take 1 tablet by mouth daily.     ondansetron (ZOFRAN) 4 MG tablet Take 1  tablet (4 mg total) by mouth daily as needed for nausea or vomiting. 30 tablet 1   oxyCODONE 10 MG TABS Take 1 tablet (10 mg total) by mouth every 4 (four) hours as needed for up to 7 days for moderate pain. 30 tablet 0   polyethylene glycol (MIRALAX / GLYCOLAX) 17 g packet Take 17 g by mouth 2 (two) times daily.     psyllium (REGULOID) 0.52 g capsule Take 0.52 g by mouth daily.     furosemide (LASIX) 40 MG tablet Take 1 tablet (40 mg total) by mouth daily for 5 days. 5 tablet 0   potassium chloride (K-DUR) 10 MEQ tablet Take 1 tablet (10 mEq total) by mouth daily for 5 days. 5 tablet 0     Allergies  Allergen Reactions   Codeine Rash   Other Other (See Comments)    "prolixa, an antidepressant from the 80s"   Fluphenazine Hcl Rash    ROS:   All other systems reviewed & are negative except per HPI or as noted below: Constitutional:  No fevers, chills, sweats.  Weight stable Eyes:  No vision changes, No discharge HENT:  No sore throats, nasal drainage Lymph: No neck swelling, No bruising easily Pulmonary:  No cough, productive sputum CV: No orthopnea, PND  Patient walks 20 minutes for about 1/2 miles without difficulty.  No exertional chest/neck/shoulder/arm pain. GI:  No personal nor family history of GI/colon cancer, inflammatory bowel disease, irritable bowel syndrome, allergy such as Celiac Sprue, dietary/dairy problems, colitis, ulcers nor gastritis.  No recent sick contacts/gastroenteritis.  No travel outside the country.  No changes in diet. Renal: No UTIs, No hematuria Genital:  No drainage, bleeding, masses Musculoskeletal: No severe joint pain.  Good ROM major joints Skin:  No sores or lesions.  No rashes Heme/Lymph:  No easy bleeding.  No swollen lymph nodes Neuro: No focal weakness/numbness.  No seizures Psych: No suicidal ideation.  No hallucinations  BP (!) 153/117    Pulse (!) 123    Temp 97.8 F (36.6 C) (Oral)    Resp (!) 41    Ht 6\' 1"  (1.854 m)    Wt 77.1 kg     SpO2 100%    BMI 22.43 kg/m   Physical Exam: General: Pt awake/alert/oriented x4 in moderate major acute distress.  Cachectic. Eyes: PERRL, normal EOM. Sclera nonicteric  Neuro: CN II-XII intact w/o focal sensory/motor deficits. Lymph: No head/neck/groin lymphadenopathy Psych:  No delerium/psychosis/paranoia.  Mildly anxious but consolable. HENT: Normocephalic, Mucus membranes moist.  No thrush Neck: Supple, No tracheal deviation Chest: No pain.  Good respiratory excursion.  Mild conversational dyspnea.  Decreased breath sounds on left side. CV:  Pulses intact.  Tachycardic but regular rhythm Abdomen: Moderately distended.  Some mild discomfort on left side of abdomen but no guarding or definite peritonitis.  Mostly soft.  Thin abdominal wall.  No obvious umbilical or incisional hernias. Gen:  No inguinal hernias.  No inguinal lymphadenopathy.   Ext:  SCDs BLE.  No significant edema.  No cyanosis Skin: No petechiae / purpurea.  No major sores Musculoskeletal: No severe joint pain.  Good ROM major joints   Results:   Labs: Results for orders placed or performed during the hospital encounter of 07/05/19 (from the past 48 hour(s))  Urinalysis, Routine w reflex microscopic     Status: Abnormal   Collection Time: 07/05/19  2:46 AM  Result Value Ref Range   Color, Urine AMBER (A) YELLOW    Comment: BIOCHEMICALS MAY BE AFFECTED BY COLOR   APPearance CLOUDY (A) CLEAR   Specific Gravity, Urine >1.030 (H) 1.005 - 1.030   pH 5.5 5.0 - 8.0   Glucose, UA NEGATIVE NEGATIVE mg/dL   Hgb urine dipstick NEGATIVE NEGATIVE   Bilirubin Urine SMALL (A) NEGATIVE   Ketones, ur NEGATIVE NEGATIVE mg/dL   Protein, ur 30 (A) NEGATIVE mg/dL   Nitrite NEGATIVE NEGATIVE   Leukocytes,Ua NEGATIVE NEGATIVE    Comment: Performed at [facility], [place] [address], [place], [place] [address]  Urinalysis, Microscopic (reflex)     Status: Abnormal   Collection Time: 07/05/19  2:46 AM  Result  Value Ref Range   RBC / HPF 0-5 0 - 5 RBC/hpf   WBC, UA 6-10 0 - 5 WBC/hpf   Bacteria, UA RARE (A) NONE SEEN   Squamous Epithelial / LPF 0-5 0 - 5   Mucus PRESENT    Hyaline Casts, UA PRESENT     Comment: Performed at [facility], [place] [address], [place], [place] [address]  SARS Coronavirus 2 [facility] order, Performed in [facility] hospital lab) Nasopharyngeal Nasopharyngeal Swab     Status: None   Collection Time: 07/05/19  3:34 AM   Specimen: Nasopharyngeal Swab  Result Value Ref Range   SARS Coronavirus 2 NEGATIVE NEGATIVE    Comment: (NOTE) If result is NEGATIVE SARS-CoV-2 target nucleic acids are NOT DETECTED. The SARS-CoV-2 RNA is generally detectable in upper and lower  respiratory specimens during the acute phase of infection. The lowest  concentration of SARS-CoV-2 viral copies this assay can detect is 250  copies / mL. A negative result does not preclude SARS-CoV-2 infection  and should not be used as the sole basis for treatment or other  patient management decisions.  A negative result may occur with  improper specimen collection / handling, submission of specimen other  than nasopharyngeal swab, presence of viral mutation(s) within the  areas targeted by this assay, and inadequate number of viral copies  (<250 copies / mL). A negative result must be combined with clinical  observations, patient history, and epidemiological information. If result is POSITIVE SARS-CoV-2 target nucleic acids are DETECTED. The SARS-CoV-2 RNA is generally detectable in upper and lower  respiratory specimens dur ing the acute phase of infection.  Positive  results are indicative of active infection with SARS-CoV-2.  Clinical  correlation with patient history and other diagnostic information is  necessary to determine patient infection status.  Positive results do  not rule out bacterial infection or co-infection with other viruses. If result is PRESUMPTIVE  POSTIVE SARS-CoV-2 nucleic acids MAY BE PRESENT.   A presumptive positive result was obtained on the submitted specimen  and confirmed on repeat testing.  While 2019 novel coronavirus  (SARS-CoV-2) nucleic acids may be present in the submitted sample  additional confirmatory testing may be necessary for epidemiological  and / or clinical management purposes  to differentiate between  SARS-CoV-2 and other Sarbecovirus currently known to infect humans.  If clinically indicated additional testing with an alternate test  methodology [phone]) is advised. The SARS-CoV-2 RNA is generally  detectable in upper and lower respiratory sp ecimens during the acute  phase of infection. The expected result is Negative. Fact Sheet for Patients:  StrictlyIdeas.no Fact Sheet for Healthcare Providers: BankingDealers.co.za This test is not yet approved or cleared by the [place] FDA and has been authorized for detection and/or diagnosis of SARS-CoV-2 by FDA under an Emergency Use Authorization (EUA).  This EUA will remain in effect (meaning this test can be used) for the duration of the COVID-19 declaration under Section 564(b)(1) of the Act, 21 U.S.C. section 360bbb-3(b)(1), unless the authorization is terminated or revoked sooner. Performed at [facility] [address] [place], [place] [address]   Lipase, blood     Status: Abnormal   Collection Time: 07/05/19  4:20 AM  Result Value Ref Range   Lipase 329 (H) 11 - 51 U/L    Comment: Performed at [facility], [place] [address] [place], [place] [address]  Comprehensive metabolic panel     Status: Abnormal   Collection Time: 07/05/19  4:20 AM  Result Value Ref Range   Sodium 131 (L) 135 - 145 mmol/L   Potassium 3.8 3.5 - 5.1 mmol/L   Chloride 91 (L) 98 - 111 mmol/L   CO2 27 22 - 32 mmol/L   Glucose, Bld 165 (H) 70 - 99 mg/dL   BUN 9 6 - 20 mg/dL    Creatinine, Ser 0.88 0.61 - 1.24 mg/dL   Calcium 9.1 8.9 - 10.3 mg/dL   Total Protein 7.9 6.5 - 8.1 g/dL   Albumin 2.3 (L) 3.5 - 5.0 g/dL   AST 24 15 - 41 U/L   ALT 33 0 - 44 U/L   Alkaline Phosphatase 146 (H) 38 - 126 U/L   Total Bilirubin 0.4 0.3 - 1.2 mg/dL   GFR calc non Af Amer >60 >60 mL/min   GFR calc Af Amer >60 >60 mL/min   Anion gap 13 5 - 15    Comment: Performed at [facility], [place] [address], [place], [place] [address]  CBC     Status: Abnormal   Collection Time: 07/05/19  4:20 AM  Result Value Ref Range   WBC 28.4 (H) 4.0 - 10.5 K/uL   RBC 4.84 4.22 - 5.81 MIL/uL   Hemoglobin 13.3 13.0 - 17.0 g/dL   HCT 42.5 39.0 - 52.0 %   MCV 87.8 80.0 - 100.0 fL   MCH 27.5 26.0 - 34.0 pg   MCHC 31.3 30.0 - 36.0 g/dL   RDW 13.4 11.5 - 15.5 %   Platelets 1,238 (HH) 150 - 400 K/uL    Comment: REPEATED TO VERIFY PLATELET COUNT CONFIRMED BY SMEAR THIS CRITICAL RESULT HAS VERIFIED AND BEEN CALLED TO J  ROMANDO,RN BY JACQUELINE HOLMES ON 08 16 2020 AT 0515, AND HAS BEEN READ BACK. CRTICAL RESULTS VERIFIED    nRBC 0.0 0.0 - 0.2 %    Comment: Performed at [facility], [place] [address] [place], [place] [address]  Troponin I (High Sensitivity)     Status: None   Collection Time: 07/05/19  4:20 AM  Result Value Ref Range   Troponin I (High Sensitivity) 5 <18 ng/L    Comment: (NOTE) Elevated high sensitivity troponin I (hsTnI) values and significant  changes across serial measurements may suggest ACS but many other  chronic and acute conditions are known to elevate hsTnI results.  Refer to the "Links" section for chest pain algorithms and additional  guidance. Performed at [facility], [place] [address], [place], [place] [address]   Lactic acid, plasma     Status: None   Collection Time: 07/05/19  4:20 AM  Result Value Ref Range   Lactic Acid, Venous 1.7 0.5 - 1.9 mmol/L    Comment: Performed at [facility],  [place] [address], [place], [place] [address]  APTT     Status: None   Collection Time: 07/05/19  4:20 AM  Result Value Ref Range   aPTT 32 24 - 36 seconds    Comment: Performed at [facility], [place] [address] [place], [place] [address]  Protime-INR     Status: Abnormal   Collection Time: 07/05/19  4:20 AM  Result Value Ref Range   Prothrombin Time 15.3 (H) 11.4 - 15.2 seconds   INR 1.2 0.8 - 1.2    Comment: (NOTE) INR goal varies based on device and disease states. Performed at [facility], [place] [address], [place], [place] [address]   D-dimer, quantitative     Status: Abnormal   Collection Time: 07/05/19  4:20 AM  Result Value Ref Range   D-Dimer, Quant 9.61 (H) 0.00 - 0.50 ug/mL-FEU    Comment: (NOTE) At the manufacturer cut-off of 0.50 ug/mL FEU, this assay has been documented to exclude PE with a sensitivity and negative predictive value of 97 to 99%.  At this time, this assay has not been approved by the FDA to exclude DVT/VTE. Results should be correlated with clinical presentation. Performed at [facility], [place] [address] [place], [place] [address]   Procalcitonin     Status: None   Collection Time: 07/05/19  4:20 AM  Result Value Ref Range   Procalcitonin 0.40 ng/mL    Comment:        Interpretation: PCT (Procalcitonin) <= 0.5 ng/mL: Systemic infection (sepsis) is not likely. Local bacterial infection is possible. (NOTE)       Sepsis PCT Algorithm           Lower Respiratory Tract                                      Infection PCT Algorithm    ----------------------------     ----------------------------         PCT < 0.25 ng/mL                PCT < 0.10 ng/mL         Strongly encourage             Strongly discourage   discontinuation of antibiotics    initiation of antibiotics    ----------------------------     -----------------------------  PCT 0.25 - 0.50 ng/mL            PCT 0.10 - 0.25 ng/mL                OR       >80% decrease in PCT            Discourage initiation of                                            antibiotics      Encourage discontinuation           of antibiotics    ----------------------------     -----------------------------         PCT >= 0.50 ng/mL              PCT 0.26 - 0.50 ng/mL               AND        <80% decrease in PCT             Encourage initiation of                                             antibiotics       Encourage continuation           of antibiotics    ----------------------------     -----------------------------        PCT >= 0.50 ng/mL                  PCT > 0.50 ng/mL               AND         increase in PCT                  Strongly encourage                                      initiation of antibiotics    Strongly encourage escalation           of antibiotics                                     -----------------------------                                           PCT <= 0.25 ng/mL                                                 OR                                        > 80% decrease in PCT  Discontinue / Do not initiate                                             antibiotics Performed at [place] [address] [place], [place] [address]   Lactate dehydrogenase     Status: None   Collection Time: 07/05/19  4:20 AM  Result Value Ref Range   LDH 188 98 - 192 U/L    Comment: Performed at [facility], [place] [address], [place], [place] [address]  Ferritin     Status: Abnormal   Collection Time: 07/05/19  4:20 AM  Result Value Ref Range   Ferritin 939 (H) 24 - 336 ng/mL    Comment: Performed at [facility], [place] [address], [place], [place] [address]  Triglycerides     Status: None   Collection Time: 07/05/19  4:20 AM  Result Value Ref Range   Triglycerides 139 <150 mg/dL    Comment: Performed at [facility], [place] [address], [place], [place] [address]  Fibrinogen     Status: Abnormal   Collection Time: 07/05/19  4:20 AM  Result Value Ref Range   Fibrinogen 555 (H) 210 - 475 mg/dL    Comment: Performed at [facility], [place] [address] [place], [place] [address]  C-reactive protein     Status: Abnormal   Collection Time: 07/05/19  4:20 AM  Result Value Ref Range   CRP 13.7 (H) <1.0 mg/dL    Comment: Performed at [facility], [place] [address], [place], [place] [address]  Brain natriuretic peptide     Status: None   Collection Time: 07/05/19  4:20 AM  Result Value Ref Range   B Natriuretic Peptide 98.7 0.0 - 100.0 pg/mL    Comment: Performed at [facility], [place] [address] [place], [place] [address]  Lactic acid, plasma     Status: None   Collection Time: 07/05/19  7:00 AM  Result Value Ref Range   Lactic Acid, Venous 1.8 0.5 - 1.9 mmol/L    Comment: Performed at [facility], [place] [address] [place], [place] [address]  Troponin I (High Sensitivity)     Status: None   Collection Time: 07/05/19  7:00 AM  Result Value Ref Range   Troponin I (High Sensitivity) 8 <18 ng/L    Comment: (NOTE) Elevated high sensitivity troponin I (hsTnI) values and significant  changes across serial measurements may suggest ACS but many other  chronic and acute conditions are known to elevate hsTnI results.  Refer to the "Links" section for chest pain algorithms and additional  guidance. Performed at [facility], [place] [address], [place], [place] [address]     Imaging / Studies: Dg Chest 1 View  Result Date: 06/26/2019 CLINICAL DATA:  Status post thoracentesis. EXAM: CHEST  1 VIEW COMPARISON:  06/24/2019 FINDINGS: Interval near complete evacuation of the left pleural fluid collection. A small residual effusion remains. No evidence of a postprocedural pneumothorax. Left basilar atelectasis is noted. The  right lung is clear. No right-sided effusion. IMPRESSION: Interval near complete evacuation of the left pleural fluid collection with left lower lobe atelectasis. No postprocedural pneumothorax. Electronically Signed   By: Marijo Sanes M.D.   On: 06/26/2019 09:40   Dg Abd 1 View  Result Date: 06/21/2019  CLINICAL DATA:  Pt reports extreme abdominal pain across entire abdomen. Reports hx of pancreatitis. Pain with any movements. EXAM: ABDOMEN - 1 VIEW COMPARISON:  None. FINDINGS: The bowel gas pattern is normal. Cholecystectomy clips. No radio-opaque calculi or other significant radiographic abnormality are seen. IMPRESSION: Negative. Electronically Signed   By: Lucrezia Europe M.D.   On: 06/21/2019 11:04   Ct Chest W Contrast  Result Date: 06/25/2019 CLINICAL DATA:  Shortness of breath, acute on chronic pancreatitis with multiple peripancreatic fluid collections, recurrent epigastric pain EXAM: CT CHEST, ABDOMEN, AND PELVIS WITH CONTRAST TECHNIQUE: Multidetector CT imaging of the chest, abdomen and pelvis was performed following the standard protocol during bolus administration of intravenous contrast. 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 Normal heart size. No pericardial effusion. Mediastinum/Nodes: No enlarged mediastinal, hilar, or axillary lymph nodes. Thyroid gland, trachea, and esophagus demonstrate no significant findings. Lungs/Pleura: There is a large left pleural effusion with associated atelectasis or consolidation, enlarged compared to prior examination. There is a new, trace right pleural effusion. There are scattered subpleural ground-glass pulmonary opacities, most conspicuous in the right upper lobe. Musculoskeletal: No chest wall mass or suspicious bone lesions identified. CT ABDOMEN PELVIS FINDINGS  Hepatobiliary: No focal liver abnormality is seen. Status post cholecystectomy. No biliary dilatation. Pancreas: Redemonstrated extensive retroperitoneal inflammation and multiple peripancreatic fluid collections. There is a new discrete fluid collection or component adjacent to the greater curvature of the stomach and spleen measuring approximately 6.2 x 2.7 cm (series 3, image 52). There is an additional new fluid collection within or adjacent to the omentum measuring 4.7 x 2.5 cm (series 3, image 75). There is an additional new fluid collection within the small bowel mesentery measuring 5.0 x 3.2 cm (series 3, image 86). Other fluid collections are not significantly changed, for example anterior to the pancreatic neck (series 3, image 67), adjacent to the porta hepatis (series 3, image 67), adjacent to the left kidney and splenic flexure (series 3, image 73), and posterior to the left kidney (series 3, image 65). Spleen: Splenomegaly, maximum span 14.4 cm. Adrenals/Urinary Tract: Adrenal glands are unremarkable. Kidneys are normal, without renal calculi, solid lesion, or hydronephrosis. Bladder is unremarkable. Stomach/Bowel: Stomach is within normal limits. Appendix appears normal. Inflammatory thickening of the transverse colon (series 3, image 81), similar to prior examination, and of the mid small bowel, particularly a segment in the anterior abdomen (series 3, image 95). Vascular/Lymphatic: Aortic atherosclerosis. No enlarged abdominal or pelvic lymph nodes. Reproductive: No mass or other abnormality. Other: Extensive anasarca. Small volume ascites, similar to prior examination. Musculoskeletal: No acute or significant osseous findings. IMPRESSION: 1. There is a large left pleural effusion with associated atelectasis or consolidation, enlarged compared to prior examination. There is a new, trace right pleural effusion. 2. There are scattered subpleural ground-glass pulmonary opacities, most conspicuous in  the right upper lobe. These are nonspecific and infectious or inflammatory. 3. Redemonstrated extensive retroperitoneal inflammation and multiple peripancreatic fluid collections. There is a new discrete fluid collection or component adjacent to the greater curvature of the stomach and spleen measuring approximately 6.2 x 2.7 cm (series 3, image 52). There is an additional new fluid collection within or adjacent to the omentum measuring 4.7 x 2.5 cm (series 3, image 75). There is an additional new fluid collection within the small bowel mesentery measuring 5.0 x 3.2 cm (  series 3, image 86). 4. Other fluid collections are not significantly changed, for example anterior to the pancreatic neck (series 3, image 67), adjacent to the porta hepatis (series 3, image 67), adjacent to the left kidney and splenic flexure (series 3, image 73), and posterior to the left kidney (series 3, image 65). 5. Inflammatory thickening of the transverse colon (series 3, image 81), similar to prior examination, and of the mid small bowel, particularly a segment in the anterior abdomen (series 3, image 95). 6. Extensive anasarca. Small volume ascites, similar to prior examination. Electronically Signed   By: Eddie Candle M.D.   On: 06/25/2019 14:18   Ct Angio Chest Pe W/cm &/or Wo Cm  Result Date: 07/05/2019 CLINICAL DATA:  Chest pain.  Complicated pancreatitis. EXAM: CT ANGIOGRAPHY CHEST WITH CONTRAST TECHNIQUE: Multidetector CT imaging of the chest was performed using the standard protocol during bolus administration of intravenous contrast. Multiplanar CT image reconstructions and MIPs were obtained to evaluate the vascular anatomy. CONTRAST:  171mL OMNIPAQUE IOHEXOL 350 MG/ML SOLN COMPARISON:  Chest CT from 10 days ago FINDINGS: Cardiovascular: Satisfactory opacification of the pulmonary arteries to the segmental level. No evidence of pulmonary embolism when accounting for intermittent motion artifact. Normal heart size. No  pericardial effusion. Aberrant right subclavian artery Mediastinum/Nodes: Negative for adenopathy or mass. Lungs/Pleura: Large left pleural effusion that is layering. Trace right pleural fluid. Near complete collapse of the left lower lobe. No consolidation or edema. Upper Abdomen: As reported separately. Musculoskeletal: No acute or aggressive finding Review of the MIP images confirms the above findings. IMPRESSION: 1. Negative for pulmonary embolism. 2. Large layering left pleural effusion with near complete lower lobe collapse. Electronically Signed   By: Monte Fantasia M.D.   On: 07/05/2019 07:16   Ct Abdomen Pelvis W Contrast  Result Date: 06/25/2019 CLINICAL DATA:  Shortness of breath, acute on chronic pancreatitis with multiple peripancreatic fluid collections, recurrent epigastric pain EXAM: CT CHEST, ABDOMEN, AND PELVIS WITH CONTRAST TECHNIQUE: Multidetector CT imaging of the chest, abdomen and pelvis was performed following the standard protocol during bolus administration of intravenous contrast. 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 Normal heart size. No pericardial effusion. Mediastinum/Nodes: No enlarged mediastinal, hilar, or axillary lymph nodes. Thyroid gland, trachea, and esophagus demonstrate no significant findings. Lungs/Pleura: There is a large left pleural effusion with associated atelectasis or consolidation, enlarged compared to prior examination. There is a new, trace right pleural effusion. There are scattered subpleural ground-glass pulmonary opacities, most conspicuous in the right upper lobe. Musculoskeletal: No chest wall mass or suspicious bone lesions identified. CT ABDOMEN PELVIS FINDINGS Hepatobiliary: No focal liver abnormality is seen. Status post cholecystectomy. No biliary  dilatation. Pancreas: Redemonstrated extensive retroperitoneal inflammation and multiple peripancreatic fluid collections. There is a new discrete fluid collection or component adjacent to the greater curvature of the stomach and spleen measuring approximately 6.2 x 2.7 cm (series 3, image 52). There is an additional new fluid collection within or adjacent to the omentum measuring 4.7 x 2.5 cm (series 3, image 75). There is an additional new fluid collection within the small bowel mesentery measuring 5.0 x 3.2 cm (series 3, image 86). Other fluid collections are not significantly changed, for example anterior to the pancreatic neck (series 3, image 67), adjacent to the porta hepatis (series 3, image 67), adjacent to the left kidney and splenic  flexure (series 3, image 73), and posterior to the left kidney (series 3, image 65). Spleen: Splenomegaly, maximum span 14.4 cm. Adrenals/Urinary Tract: Adrenal glands are unremarkable. Kidneys are normal, without renal calculi, solid lesion, or hydronephrosis. Bladder is unremarkable. Stomach/Bowel: Stomach is within normal limits. Appendix appears normal. Inflammatory thickening of the transverse colon (series 3, image 81), similar to prior examination, and of the mid small bowel, particularly a segment in the anterior abdomen (series 3, image 95). Vascular/Lymphatic: Aortic atherosclerosis. No enlarged abdominal or pelvic lymph nodes. Reproductive: No mass or other abnormality. Other: Extensive anasarca. Small volume ascites, similar to prior examination. Musculoskeletal: No acute or significant osseous findings. IMPRESSION: 1. There is a large left pleural effusion with associated atelectasis or consolidation, enlarged compared to prior examination. There is a new, trace right pleural effusion. 2. There are scattered subpleural ground-glass pulmonary opacities, most conspicuous in the right upper lobe. These are nonspecific and infectious or inflammatory. 3. Redemonstrated  extensive retroperitoneal inflammation and multiple peripancreatic fluid collections. There is a new discrete fluid collection or component adjacent to the greater curvature of the stomach and spleen measuring approximately 6.2 x 2.7 cm (series 3, image 52). There is an additional new fluid collection within or adjacent to the omentum measuring 4.7 x 2.5 cm (series 3, image 75). There is an additional new fluid collection within the small bowel mesentery measuring 5.0 x 3.2 cm (series 3, image 86). 4. Other fluid collections are not significantly changed, for example anterior to the pancreatic neck (series 3, image 67), adjacent to the porta hepatis (series 3, image 67), adjacent to the left kidney and splenic flexure (series 3, image 73), and posterior to the left kidney (series 3, image 65). 5. Inflammatory thickening of the transverse colon (series 3, image 81), similar to prior examination, and of the mid small bowel, particularly a segment in the anterior abdomen (series 3, image 95). 6. Extensive anasarca. Small volume ascites, similar to prior examination. Electronically Signed   By: Eddie Candle M.D.   On: 06/25/2019 14:18   Ct Abdomen Pelvis W Contrast  Result Date: 06/20/2019 CLINICAL DATA:  Abdominal pain.  Neutropenia. EXAM: CT ABDOMEN AND PELVIS WITH CONTRAST TECHNIQUE: Multidetector CT imaging of the abdomen and pelvis was performed using the standard protocol following bolus administration of intravenous contrast. CONTRAST:  124mL OMNIPAQUE IOHEXOL 300 MG/ML  SOLN COMPARISON:  None. FINDINGS: Lower chest: There is a moderate-sized left-sided pleural effusion with near complete collapse of the left lower lobe.The heart size is normal. Hepatobiliary: The liver is normal. Status post cholecystectomy.There is no biliary ductal dilation. Pancreas: There are multiple peripancreatic fluid collections the largest of which measures approximately 3.7 by 1.8 cm. The pancreas appears to enhance symmetrically.  Spleen: The spleen is enlarged measuring approximately 14 cm craniocaudad. Adrenals/Urinary Tract: --Adrenal glands: No adrenal hemorrhage. --Right kidney/ureter: No hydronephrosis or perinephric hematoma. --Left kidney/ureter: There is no left-sided hydronephrosis. There is a complex collection in the left posterior pararenal space measuring approximately 8.5 x 2.5 cm. --Urinary bladder: Unremarkable. Stomach/Bowel: --Stomach/Duodenum: There is some wall thickening of the stomach. --Small bowel: No dilatation or inflammation. --Colon: There are soft tissue densities along the descending colon measuring approximately 2.8 x 2.4 cm. There is a collection of the splenic flexure measuring 4.7 x 2.7 cm causing mass effect on the nearby:Marland Kitchen There is wall thickening of the transverse colon without evidence of an obstruction. --Appendix: Not visualized. No right lower quadrant inflammation or free fluid. Vascular/Lymphatic: Atherosclerotic changes are noted  of the abdominal aorta without evidence of an abdominal aortic aneurysm. The portal vein and splenic vein remain patent. The splenic artery remains patent. --No retroperitoneal lymphadenopathy. --No mesenteric lymphadenopathy. --No pelvic or inguinal lymphadenopathy. Reproductive: Unremarkable Other: There is a small volume of free fluid in the pelvis. There are multiple scattered collections in the retroperitoneum and peritoneal cavity. A few these collections demonstrate mild rim enhancement. For example in the left upper quadrant there is a 3.9 x 1.9 cm collection that demonstrates mild peripheral rim enhancement. There is a 3.8 by 4.4 cm collection in the region of the gallbladder fossa. Given the surgical clips in the gallbladder fossa this is favored to represent a loculated fluid collection as opposed to a remnant gallbladder. Musculoskeletal. No acute displaced fractures. IMPRESSION: 1. Overall findings concerning for pancreatitis with multiple loculated fluid  collections as detailed above. Some of the smaller collections, for example in the left upper quadrant, demonstrate rim enhancement. An abscess is not excluded. There is no CT evidence for pancreatic necrosis. 2. Moderate-sized left-sided pleural effusion with at least partial collapse of the left lower lobe. 3. Diffuse wall thickening of the transverse colon and splenic flexure favored to be reactive. Other considerations include infectious or inflammatory colitis. 4. Splenomegaly.  The splenic vein remains patent. Electronically Signed   By: Constance Holster M.D.   On: 06/20/2019 21:07   Dg Chest Portable 1 View  Result Date: 07/05/2019 CLINICAL DATA:  Chest pain. EXAM: PORTABLE CHEST 1 VIEW COMPARISON:  Radiograph 06/26/2019, CT 06/25/2019 FINDINGS: Re-accumulation of left pleural effusion after thoracentesis. Pleural effusion is moderate in size with associated underlying left lung base atelectasis/consolidation. Possible small right pleural effusion. Unchanged heart size and mediastinal contours. Increasing patchy right lung base opacity. No pneumothorax. IMPRESSION: 1. Re-accumulation of left pleural effusion after thoracentesis, now moderate in size. Associated left lung base atelectasis/consolidation. 2. Possible small right pleural effusion. Increasing right lung base opacity may be atelectasis or pneumonia. Electronically Signed   By: Keith Rake M.D.   On: 07/05/2019 03:22   Dg Chest Port 1 View  Result Date: 06/24/2019 CLINICAL DATA:  Chest pain for 1 hour EXAM: PORTABLE CHEST 1 VIEW COMPARISON:  06/20/2019 FINDINGS: Left-sided pleural effusion is again seen and slightly enlarged when compare with the prior study. Likely underlying atelectasis/infiltrate is present in the left base. The right lung is clear. No bony abnormality is noted. IMPRESSION: Increasing left-sided pleural effusion. Electronically Signed   By: Inez Catalina M.D.   On: 06/24/2019 19:33   Dg Chest Portable 1  View  Result Date: 06/20/2019 CLINICAL DATA:  Chest pain EXAM: PORTABLE CHEST 1 VIEW COMPARISON:  None. FINDINGS: There is airspace consolidation in the left base with left pleural effusion. Lungs elsewhere are clear. Heart size and pulmonary vascularity are normal. No adenopathy. No bone lesions. IMPRESSION: Left lower lobe airspace consolidation consistent with pneumonia. Small left pleural effusion. Lungs elsewhere clear. No adenopathy. Heart size normal. Followup PA and lateral chest radiographs recommended in 3-4 weeks following trial of antibiotic therapy to ensure resolution and exclude underlying malignancy. Electronically Signed   By: Lowella Grip III M.D.   On: 06/20/2019 19:32   Ct Renal Stone Study  Result Date: 07/05/2019 CLINICAL DATA:  Chest pain. EXAM: CT ABDOMEN AND PELVIS WITHOUT CONTRAST TECHNIQUE: Multidetector CT imaging of the abdomen and pelvis was performed following the standard protocol without IV contrast. COMPARISON:  Fifteen days ago FINDINGS: Lower chest: At least moderate left pleural effusion with multi segment  atelectasis. Pending chest CT. Hepatobiliary: No focal liver abnormality.Cholecystectomy. Pancreas: History of recurrent pancreatitis. Progressive organized collections: 1. 8 x 4 cm along the upper greater curvature of the stomach 2. 6 cm along the mid greater curvature of the stomach-which may be connected to #1. 3. Peripancreatic to splenic tail, 15 x 4 by up to 6 cm-with further extent tracking inferiorly along the descending colon. 4. Ligamentum venosum of the liver measuring 5 x 3 cm 5. Ventral midline abdomen at 5 cm 6. Lower ventral midline abdomen with interloop distortion, 9 cm. These collections exert mass effect on the stomach, transverse colon, and descending colon. Fat density with rim of stranding posterior to the left kidney in the posterior pararenal space, stable in compatible with fat necrosis. There is generalized retroperitoneal edema about the  collections and pancreas. No gas within any of the collections to suggest fistula or definite superinfection. Spleen: Stable size and density. Adrenals/Urinary Tract: Negative adrenals. No hydronephrosis or stone. Unremarkable bladder. Stomach/Bowel: No obstruction. Low-density about the hepatic flexure is likely a combination of reactive wall thickening and pericolonic fluid. Vascular/Lymphatic: No visible acute vascular abnormality. No noted adenopathy. Reproductive:Negative Other: Right upper quadrant ascites which may be loculated. Musculoskeletal: No acute abnormalities. IMPRESSION: 1. Recent pancreatitis with multiple organized fluid collections in the abdomen as listed and measured above. The largest measures 15 x 4 x 6 cm. Collections exert mass effect on the stomach, transverse colon, descending colon without obstruction. 2. Stable left posterior pararenal fat necrosis. 3. At least moderate left pleural effusion with multi segment atelectasis. Electronically Signed   By: Monte Fantasia M.D.   On: 07/05/2019 07:12   Ir Thoracentesis Asp Pleural Space W/img Guide  Result Date: 06/26/2019 INDICATION: Shortness of breath. Left-sided pleural effusion. Request for diagnostic and therapeutic thoracentesis. EXAM: ULTRASOUND GUIDED LEFT THORACENTESIS MEDICATIONS: None. COMPLICATIONS: None immediate. PROCEDURE: An ultrasound guided thoracentesis was thoroughly discussed with the patient and questions answered. The benefits, risks, alternatives and complications were also discussed. The patient understands and wishes to proceed with the procedure. Written consent was obtained. Ultrasound was performed to localize and mark an adequate pocket of fluid in the left chest. The area was then prepped and draped in the normal sterile fashion. 1% Lidocaine was used for local anesthesia. Under ultrasound guidance a 6 Fr Safe-T-Centesis catheter was introduced. Thoracentesis was performed. The catheter was removed and a  dressing applied. FINDINGS: A total of approximately 1.3 L of hazy yellow fluid was removed. Samples were sent to the laboratory as requested by the clinical team. IMPRESSION: Successful ultrasound guided left thoracentesis yielding 1.3 L of pleural fluid. Read by: Ascencion Dike PA-C Electronically Signed   By: Markus Daft M.D.   On: 06/26/2019 09:19    Medications / Allergies: per chart  Antibiotics: Anti-infectives (From admission, onward)   Start     Dose/Rate Route Frequency Ordered Stop   07/05/19 0345  vancomycin (VANCOCIN) IVPB 1000 mg/200 mL premix  Status:  Discontinued     1,000 mg 200 mL/hr over 60 Minutes Intravenous  Once 07/05/19 0333 07/05/19 0336   07/05/19 0345  ceFEPIme (MAXIPIME) 2 g in sodium chloride 0.9 % 100 mL IVPB     2 g 200 mL/hr over 30 Minutes Intravenous  Once 07/05/19 0333     07/05/19 0345  vancomycin (VANCOCIN) 1,500 mg in sodium chloride 0.9 % 500 mL IVPB     1,500 mg 250 mL/hr over 120 Minutes Intravenous  Once 07/05/19 0336 07/05/19 0724  Note: Portions of this report may have been transcribed using voice recognition software. Every effort was made to ensure accuracy; however, inadvertent computerized transcription errors may be present.   Any transcriptional errors that result from this process are unintentional.    Adin Hector, MD, FACS, MASCRS Gastrointestinal and Minimally Invasive Surgery    [address] [place], [place] [address] [phone] Main / Paging [phone] Fax   07/05/2019

## 2019-07-05 NOTE — ED Notes (Signed)
Report given to 2w

## 2019-07-05 NOTE — ED Notes (Signed)
Kyle Bamberger, Md at bedside

## 2019-07-05 NOTE — Progress Notes (Signed)
A consult was received from an ED physician for vancomycin and cefepime per pharmacy dosing.  The patient's profile has been reviewed for ht/wt/allergies/indication/available labs.    Allergies  Allergen Reactions  . Codeine Rash  . Other Other (See Comments)    "prolixa, an antidepressant from the 80s"  . Fluphenazine Hcl Rash    A one time order has been placed for vancomycin 1500 mg IV once and cefepime 2 g IV once.    Further antibiotics/pharmacy consults should be ordered by admitting physician if indicated.                       Thank you, Lenis Noon, PharmD 07/05/2019  3:36 AM

## 2019-07-05 NOTE — Progress Notes (Addendum)
Pharmacy Antibiotic Note  Kyle Mooney is a 48 y.o. male admitted on 07/05/2019 with sepsis.  Pharmacy has been consulted for vancomycin and cefepime dosing. PMH: recurrent AoC pancreatitis with pseudocysts, hx idiopathic necrotizing pancreatitis.  CT scan demonstrates large layering left pleural effusion as well as recurrent and increasing pancreatic fluid collections.    Plan: Flagyl 500 IV q8 Cefepime 2 gm IV q8h Vancomycin 1500 mg loading dose given in ED at 0524 am Vancomycin 1250 mg IV Q 12 hrs. Goal AUC 400-550. Expected AUC: 458   32.5/11.6 SCr used: 0.88 TBW F/u renal function, WBC, temp, culture data Vancomycin levels as needed  Height: 6\' 1"  (185.4 cm) Weight: 170 lb (77.1 kg) IBW/kg (Calculated) : 79.9  Temp (24hrs), Avg:98.1 F (36.7 C), Min:97.8 F (36.6 C), Max:98.3 F (36.8 C)  Recent Labs  Lab 07/05/19 0420 07/05/19 0700  WBC 28.4*  --   CREATININE 0.88  --   LATICACIDVEN 1.7 1.8    Estimated Creatinine Clearance: 113.2 mL/min (by C-G formula based on SCr of 0.88 mg/dL).    Allergies  Allergen Reactions  . Codeine Rash  . Other Other (See Comments)    "prolixa, an antidepressant from the 80s"  . Fluphenazine Hcl Rash    Antimicrobials this admission: 8/16 cefepime>> 8/16 vanc>> 8/16 Flagyl>> Dose adjustments this admission:  Microbiology results: 8/16 BCx2: sent 8/16 Covid neg 8/16 UCx: sent  Thank you for allowing pharmacy to be a part of this patient's care.  Eudelia Bunch, Pharm.D [phone] 07/05/2019 9:14 AM

## 2019-07-05 NOTE — Procedures (Signed)
Ultrasound-guided diagnostic and therapeutic left thoracentesis performed yielding 770 cc of yellow fluid. No immediate complications. Follow-up chest x-ray pending. The fluid was sent to the lab for preordered studies. EBL none.       

## 2019-07-05 NOTE — Progress Notes (Signed)
Notified bedside nurse of need to administer antibiotics. Bedside RN called to ask if maxipime had been given that was ordered at [place]. RN stated patient only has one IV and the abx will be given when vanc finishes infusing.

## 2019-07-05 NOTE — ED Provider Notes (Addendum)
Patient was seen Dr. Tomi Bamberger.  Please see her note.  Patient has a complex medical history with recent admission for necrotizing pancreatitis.  Patient's labs are notable for persistent elevation in his white blood cell count as well as his lipase.  Elevated fibrinogen and d-dimer are likely reactive.  Repeat cover test is negative.  CT scan demonstrates large layering left pleural effusion as well as recurrent and increasing pancreatic fluid collections.  Patient remains tachycardic and diaphoretic.  He is having significant pain.  Additional pain meds have been ordered.  He has been treated with empiric antibiotics.  I will consult general surgery and medical service for admission.  Patient may need IR versus surgical drainage plus fluid collections.      Dorie Rank, MD 07/05/19 [phone]

## 2019-07-06 ENCOUNTER — Other Ambulatory Visit: Payer: Self-pay

## 2019-07-06 DIAGNOSIS — K861 Other chronic pancreatitis: Secondary | ICD-10-CM

## 2019-07-06 DIAGNOSIS — K859 Acute pancreatitis without necrosis or infection, unspecified: Secondary | ICD-10-CM

## 2019-07-06 DIAGNOSIS — F1020 Alcohol dependence, uncomplicated: Secondary | ICD-10-CM

## 2019-07-06 LAB — URINE CULTURE: Culture: NO GROWTH

## 2019-07-06 LAB — CBC
HCT: 38.5 % — ABNORMAL LOW (ref 39.0–52.0)
Hemoglobin: 11.7 g/dL — ABNORMAL LOW (ref 13.0–17.0)
MCH: 27 pg (ref 26.0–34.0)
MCHC: 30.4 g/dL (ref 30.0–36.0)
MCV: 88.9 fL (ref 80.0–100.0)
Platelets: 905 10*3/uL (ref 150–400)
RBC: 4.33 MIL/uL (ref 4.22–5.81)
RDW: 13.2 % (ref 11.5–15.5)
WBC: 25.4 10*3/uL — ABNORMAL HIGH (ref 4.0–10.5)
nRBC: 0 % (ref 0.0–0.2)

## 2019-07-06 LAB — GRAM STAIN

## 2019-07-06 LAB — COMPREHENSIVE METABOLIC PANEL
ALT: 20 U/L (ref 0–44)
AST: 14 U/L — ABNORMAL LOW (ref 15–41)
Albumin: 1.8 g/dL — ABNORMAL LOW (ref 3.5–5.0)
Alkaline Phosphatase: 102 U/L (ref 38–126)
Anion gap: 11 (ref 5–15)
BUN: 14 mg/dL (ref 6–20)
CO2: 24 mmol/L (ref 22–32)
Calcium: 8.7 mg/dL — ABNORMAL LOW (ref 8.9–10.3)
Chloride: 98 mmol/L (ref 98–111)
Creatinine, Ser: 0.77 mg/dL (ref 0.61–1.24)
GFR calc Af Amer: >60 mL/min (ref >60)
GFR calc non Af Amer: >60 mL/min (ref >60)
Glucose, Bld: 122 mg/dL — ABNORMAL HIGH (ref 70–99)
Potassium: 4.4 mmol/L (ref 3.5–5.1)
Sodium: 133 mmol/L — ABNORMAL LOW (ref 135–145)
Total Bilirubin: 0.5 mg/dL (ref 0.3–1.2)
Total Protein: 5.9 g/dL — ABNORMAL LOW (ref 6.5–8.1)

## 2019-07-06 LAB — MRSA PCR SCREENING: MRSA by PCR: NEGATIVE

## 2019-07-06 LAB — PATHOLOGIST SMEAR REVIEW

## 2019-07-06 LAB — LIPASE, BLOOD: Lipase: 134 U/L — ABNORMAL HIGH (ref 11–51)

## 2019-07-06 MED ORDER — BOOST / RESOURCE BREEZE PO LIQD CUSTOM
1.0000 | Freq: Three times a day (TID) | ORAL | Status: DC
  Administered 2019-07-06 – 2019-07-08 (×6): 1 via ORAL

## 2019-07-06 MED ORDER — CHLORHEXIDINE GLUCONATE CLOTH 2 % EX PADS
6.0000 | MEDICATED_PAD | Freq: Every day | CUTANEOUS | Status: DC
  Administered 2019-07-06 – 2019-07-15 (×10): 6 via TOPICAL

## 2019-07-06 MED ORDER — CHLORPROMAZINE HCL 25 MG PO TABS
25.0000 mg | ORAL_TABLET | Freq: Once | ORAL | Status: AC
  Administered 2019-07-06: 22:00:00 25 mg via ORAL
  Filled 2019-07-06: qty 1

## 2019-07-06 MED ORDER — SODIUM CHLORIDE 0.9 % IV SOLN
INTRAVENOUS | Status: DC | PRN
  Administered 2019-07-06 – 2019-07-07 (×2): 250 mL via INTRAVENOUS

## 2019-07-06 NOTE — Progress Notes (Signed)
PROGRESS NOTE    Kyle Mooney  GNF:[id number] DOB: [birth year] DOA: 07/05/2019 PCP: Patient, No Pcp Per    Brief Narrative:  48 year old gentleman with prior history of recurrent pancreatitis IBS, GERD follows up with [facility] for his idiopathic pancreatitis and pancreatic cysts since on 8/16/20204 recurrent epigastric pain suspicious for acute on chronic pancreatitis.  He underwent CT of the chest which was  Negative for pulmonary embolism.  Large layering left pleural effusion with near complete lower lobe collapse.  IR consulted and he underwent thoracentesis for left pleural effusion.  And fluid sent for analysis.  Meanwhile surgery consulted for evaluation of the pseudocyst of the pancreas recommended to wait for 6 weeks for the pseudocyst to mature and then follow-up with Dr. Johney Maine versus follow-up with [facility] for further evaluation.  Patient is currently on IV PCA morphine and his pain is well controlled.  Assessment & Plan:   Principal Problem:   Idiopathic acute pancreatitis with uninfected necrosis Active Problems:   Recurrent left pleural effusion   GERD (gastroesophageal reflux disease)   IBS (irritable bowel syndrome)   Acute on chronic pancreatitis (HCC)   Tobacco abuse   Alcohol dependence, binge pattern (HCC)   History of cholecystectomy   Pancreatitis   Acute on chronic pancreatitis with pseudocyst Symptomatic management with IV fluids, IV pain management and IV antibiotics.  GI consulted for recommendations on management of acute pancreatitis with pseudocyst. As per GI , he will need more aggressive options such as possible distal pancreatectomy. Will order IgG4 Levels to check auto immune pancreatitis.  Follow lipase levels and clinical improvement. Liver enzymes have normalized.    Left-sided large pleural effusion Recurrent s/p thoracentesis yesterday and fluid analysis sent.  Patient is currently on 3 L of nasal cannula oxygen with good sats. We will  continue with broad-spectrum antibiotics empirically to cover infection.   Leukocytosis and thrombocytosis suspect probably reactive from acute pancreatitis and marginalization. Continue with broad-spectrum IV antibiotics to cover necrotizing pancreatitis.   Mild hyponatremia Probably from acute severe pancreatitis continue to monitor and the levels appear to be improving.   Tobacco abuse Counseling for cessation provided   Alcohol abuse Patient will be started on CIWA.   DVT prophylaxis: (Lovenox Code Status: Full code Family Communication: (None at bedside Disposition Plan: Pending clinical improvement and further evaluation  Consultants:   Surgery  Gastroenterology Dr. Cristina Gong  IR  Procedures: Thoracentesis Antimicrobials: Meropenem  Subjective: Patient reports his pain is well controlled with IV morphine PCA.  And he would like to get out of bed and take some liquid diet.  Objective: Vitals:   07/06/19 0836 07/06/19 0900 07/06/19 1000 07/06/19 1100  BP:  133/82 134/75 125/73  Pulse:  (!) 102 (!) 103 (!) 104  Resp: 14 16 14 15   Temp:      TempSrc:      SpO2: 98% 98% 98% 97%  Weight:      Height:        Intake/Output Summary (Last 24 hours) at 07/06/2019 1150 Last data filed at 07/06/2019 0800 Gross per 24 hour  Intake 3830.74 ml  Output 400 ml  Net 3430.74 ml   Filed Weights   07/05/19 0244 07/06/19 0500  Weight: 77.1 kg 77.2 kg    Examination:  General exam: Appears calm and comfortable  Respiratory system: Clear to auscultation. Respiratory effort normal. Cardiovascular system: S1 & S2 heard, RRR. No pedal edema. Gastrointestinal system: Abdomen soft, mildly tender in the lower quadrant, nondistended, bowel sounds  are good Central nervous system: Alert and oriented. No focal neurological deficits. Extremities: no pedal edema.  Skin: No rashes, lesions or ulcers Psychiatry: Mood & affect appropriate.     Data Reviewed: I have personally  reviewed following labs and imaging studies  CBC: Recent Labs  Lab 07/05/19 0420 07/06/19 0212  WBC 28.4* 25.4*  HGB 13.3 11.7*  HCT 42.5 38.5*  MCV 87.8 88.9  PLT 1,238* 893*   Basic Metabolic Panel: Recent Labs  Lab 07/05/19 0420 07/06/19 0212  NA 131* 133*  K 3.8 4.4  CL 91* 98  CO2 27 24  GLUCOSE 165* 122*  BUN 9 14  CREATININE 0.88 0.77  CALCIUM 9.1 8.7*   GFR: Estimated Creatinine Clearance: 124.6 mL/min (by C-G formula based on SCr of 0.77 mg/dL). Liver Function Tests: Recent Labs  Lab 07/05/19 0420 07/06/19 0212  AST 24 14*  ALT 33 20  ALKPHOS 146* 102  BILITOT 0.4 0.5  PROT 7.9 5.9*  ALBUMIN 2.3* 1.8*   Recent Labs  Lab 07/05/19 0420 07/06/19 0212  LIPASE 329* 134*  AMYLASE 1,026*  --    No results for input(s): AMMONIA in the last 168 hours. Coagulation Profile: Recent Labs  Lab 07/05/19 0420  INR 1.2   Cardiac Enzymes: No results for input(s): CKTOTAL, CKMB, CKMBINDEX, TROPONINI in the last 168 hours. BNP (last 3 results) No results for input(s): PROBNP in the last 8760 hours. HbA1C: No results for input(s): HGBA1C in the last 72 hours. CBG: No results for input(s): GLUCAP in the last 168 hours. Lipid Profile: Recent Labs    07/05/19 0420  TRIG 139   Thyroid Function Tests: No results for input(s): TSH, T4TOTAL, FREET4, T3FREE, THYROIDAB in the last 72 hours. Anemia Panel: Recent Labs    07/05/19 0420  FERRITIN 939*   Sepsis Labs: Recent Labs  Lab 07/05/19 0420 07/05/19 0700  PROCALCITON 0.40  --   LATICACIDVEN 1.7 1.8    Recent Results (from the past 240 hour(s))  Urine culture     Status: None   Collection Time: 07/05/19  2:46 AM   Specimen: In/Out Cath Urine  Result Value Ref Range Status   Specimen Description   Final    IN/OUT CATH URINE Performed at [facility], [place] [address] [place], [place] [address]    Special Requests   Final    NONE Performed at [facility],  [place] [address] [place], [place] [address]    Culture   Final    NO GROWTH Performed at [facility], [place] [address] [place], [place] [address]    Report Status 07/06/2019 FINAL  Final  Blood Culture (routine x 2)     Status: None (Preliminary result)   Collection Time: 07/05/19  3:33 AM   Specimen: BLOOD  Result Value Ref Range Status   Specimen Description   Final    BLOOD RIGHT ARM Performed at [place] [address] [place], [place] [address]    Special Requests   Final    BOTTLES DRAWN AEROBIC ONLY Blood Culture results may not be optimal due to an inadequate volume of blood received in culture bottles Performed at [place] [address] [place], [place] [address]    Culture   Final    NO GROWTH < 24 HOURS Performed at [place] [address], [place], [place] [address]    Report Status PENDING  Incomplete  SARS Coronavirus  2 [facility] order, Performed in [facility] hospital lab) Nasopharyngeal Nasopharyngeal Swab     Status: None   Collection Time: 07/05/19  3:34 AM   Specimen: Nasopharyngeal Swab  Result Value Ref Range Status   SARS Coronavirus 2 NEGATIVE NEGATIVE Final    Comment: (NOTE) If result is NEGATIVE SARS-CoV-2 target nucleic acids are NOT DETECTED. The SARS-CoV-2 RNA is generally detectable in upper and lower  respiratory specimens during the acute phase of infection. The lowest  concentration of SARS-CoV-2 viral copies this assay can detect is 250  copies / mL. A negative result does not preclude SARS-CoV-2 infection  and should not be used as the sole basis for treatment or other  patient management decisions.  A negative result may occur with  improper specimen collection / handling, submission of specimen other  than nasopharyngeal swab, presence of viral mutation(s) within the  areas targeted by this assay, and inadequate number of viral copies  (<250 copies / mL). A negative  result must be combined with clinical  observations, patient history, and epidemiological information. If result is POSITIVE SARS-CoV-2 target nucleic acids are DETECTED. The SARS-CoV-2 RNA is generally detectable in upper and lower  respiratory specimens dur ing the acute phase of infection.  Positive  results are indicative of active infection with SARS-CoV-2.  Clinical  correlation with patient history and other diagnostic information is  necessary to determine patient infection status.  Positive results do  not rule out bacterial infection or co-infection with other viruses. If result is PRESUMPTIVE POSTIVE SARS-CoV-2 nucleic acids MAY BE PRESENT.   A presumptive positive result was obtained on the submitted specimen  and confirmed on repeat testing.  While 2019 novel coronavirus  (SARS-CoV-2) nucleic acids may be present in the submitted sample  additional confirmatory testing may be necessary for epidemiological  and / or clinical management purposes  to differentiate between  SARS-CoV-2 and other Sarbecovirus currently known to infect humans.  If clinically indicated additional testing with an alternate test  methodology [phone]) is advised. The SARS-CoV-2 RNA is generally  detectable in upper and lower respiratory sp ecimens during the acute  phase of infection. The expected result is Negative. Fact Sheet for Patients:  StrictlyIdeas.no Fact Sheet for Healthcare Providers: BankingDealers.co.za This test is not yet approved or cleared by the [place] FDA and has been authorized for detection and/or diagnosis of SARS-CoV-2 by FDA under an Emergency Use Authorization (EUA).  This EUA will remain in effect (meaning this test can be used) for the duration of the COVID-19 declaration under Section 564(b)(1) of the Act, 21 U.S.C. section 360bbb-3(b)(1), unless the authorization is terminated or revoked sooner. Performed at [facility], [place] [address], [place], [place] [address]   Blood Culture (routine x 2)     Status: None (Preliminary result)   Collection Time: 07/05/19  7:00 AM   Specimen: BLOOD  Result Value Ref Range Status   Specimen Description   Final    BLOOD LEFT Performed at [place] [address] [place], [place] [address]    Special Requests   Final    BOTTLES DRAWN AEROBIC AND ANAEROBIC Blood Culture adequate volume Performed at [place] [address], [place], [place] [address]    Culture   Final    NO GROWTH < 24 HOURS Performed at [place] [address] [place], [place] [address]    Report Status PENDING  Incomplete  Gram stain  Status: None   Collection Time: 07/05/19  4:15 PM   Specimen: Fluid  Result Value Ref Range Status   Specimen Description FLUID  Final   Special Requests NONE  Final   Gram Stain   Final    FEW WBC PRESENT, PREDOMINANTLY MONONUCLEAR NO ORGANISMS SEEN Performed at [facility], [address] [place], [place] [address]    Report Status 07/06/2019 FINAL  Final  Culture, body fluid-bottle     Status: None (Preliminary result)   Collection Time: 07/05/19  4:15 PM   Specimen: Fluid  Result Value Ref Range Status   Specimen Description FLUID  Final   Special Requests NONE  Final   Culture   Final    NO GROWTH < 12 HOURS Performed at [facility], [place] [address], [place], [place] [address]    Report Status PENDING  Incomplete  MRSA PCR Screening     Status: None   Collection Time: 07/06/19  3:30 AM   Specimen: Nasal Mucosa; Nasopharyngeal  Result Value Ref Range Status   MRSA by PCR NEGATIVE NEGATIVE Final    Comment:        The GeneXpert MRSA Assay (FDA approved for NASAL specimens only), is one component of a comprehensive MRSA colonization surveillance program. It is not intended to diagnose MRSA infection nor to guide or monitor treatment for MRSA  infections. Performed at [facility], [place] [address] [place], [place] [address]          Radiology Studies: Ct Angio Chest Pe W/cm &/or Wo Cm  Result Date: 07/05/2019 CLINICAL DATA:  Chest pain.  Complicated pancreatitis. EXAM: CT ANGIOGRAPHY CHEST WITH CONTRAST TECHNIQUE: Multidetector CT imaging of the chest was performed using the standard protocol during bolus administration of intravenous contrast. Multiplanar CT image reconstructions and MIPs were obtained to evaluate the vascular anatomy. CONTRAST:  169mL OMNIPAQUE IOHEXOL 350 MG/ML SOLN COMPARISON:  Chest CT from 10 days ago FINDINGS: Cardiovascular: Satisfactory opacification of the pulmonary arteries to the segmental level. No evidence of pulmonary embolism when accounting for intermittent motion artifact. Normal heart size. No pericardial effusion. Aberrant right subclavian artery Mediastinum/Nodes: Negative for adenopathy or mass. Lungs/Pleura: Large left pleural effusion that is layering. Trace right pleural fluid. Near complete collapse of the left lower lobe. No consolidation or edema. Upper Abdomen: As reported separately. Musculoskeletal: No acute or aggressive finding Review of the MIP images confirms the above findings. IMPRESSION: 1. Negative for pulmonary embolism. 2. Large layering left pleural effusion with near complete lower lobe collapse. Electronically Signed   By: Monte Fantasia M.D.   On: 07/05/2019 07:16   Dg Chest Port 1 View  Result Date: 07/05/2019 CLINICAL DATA:  Status post left-sided thoracentesis EXAM: PORTABLE CHEST 1 VIEW COMPARISON:  July 05, 2019 FINDINGS: The left-sided pleural effusion has significantly decreased in size from the prior study. There is no evidence for left-sided pneumothorax. Bibasilar airspace opacities are noted, left worse than right. The lung volumes are low. The heart size is stable from prior study. IMPRESSION: 1. No pneumothorax status post left-sided  thoracentesis. 2. Significant interval decrease in size of the left-sided pleural effusion. 3. Bibasilar airspace opacities, left greater than right, favored to represent atelectasis. 4. Low lung volumes. Electronically Signed   By: Constance Holster M.D.   On: 07/05/2019 16:31   Dg Chest Portable 1 View  Result Date: 07/05/2019 CLINICAL DATA:  Chest pain. EXAM: PORTABLE CHEST 1 VIEW COMPARISON:  Radiograph 06/26/2019, CT  06/25/2019 FINDINGS: Re-accumulation of left pleural effusion after thoracentesis. Pleural effusion is moderate in size with associated underlying left lung base atelectasis/consolidation. Possible small right pleural effusion. Unchanged heart size and mediastinal contours. Increasing patchy right lung base opacity. No pneumothorax. IMPRESSION: 1. Re-accumulation of left pleural effusion after thoracentesis, now moderate in size. Associated left lung base atelectasis/consolidation. 2. Possible small right pleural effusion. Increasing right lung base opacity may be atelectasis or pneumonia. Electronically Signed   By: Keith Rake M.D.   On: 07/05/2019 03:22   Ct Renal Stone Study  Result Date: 07/05/2019 CLINICAL DATA:  Chest pain. EXAM: CT ABDOMEN AND PELVIS WITHOUT CONTRAST TECHNIQUE: Multidetector CT imaging of the abdomen and pelvis was performed following the standard protocol without IV contrast. COMPARISON:  Fifteen days ago FINDINGS: Lower chest: At least moderate left pleural effusion with multi segment atelectasis. Pending chest CT. Hepatobiliary: No focal liver abnormality.Cholecystectomy. Pancreas: History of recurrent pancreatitis. Progressive organized collections: 1. 8 x 4 cm along the upper greater curvature of the stomach 2. 6 cm along the mid greater curvature of the stomach-which may be connected to #1. 3. Peripancreatic to splenic tail, 15 x 4 by up to 6 cm-with further extent tracking inferiorly along the descending colon. 4. Ligamentum venosum of the liver  measuring 5 x 3 cm 5. Ventral midline abdomen at 5 cm 6. Lower ventral midline abdomen with interloop distortion, 9 cm. These collections exert mass effect on the stomach, transverse colon, and descending colon. Fat density with rim of stranding posterior to the left kidney in the posterior pararenal space, stable in compatible with fat necrosis. There is generalized retroperitoneal edema about the collections and pancreas. No gas within any of the collections to suggest fistula or definite superinfection. Spleen: Stable size and density. Adrenals/Urinary Tract: Negative adrenals. No hydronephrosis or stone. Unremarkable bladder. Stomach/Bowel: No obstruction. Low-density about the hepatic flexure is likely a combination of reactive wall thickening and pericolonic fluid. Vascular/Lymphatic: No visible acute vascular abnormality. No noted adenopathy. Reproductive:Negative Other: Right upper quadrant ascites which may be loculated. Musculoskeletal: No acute abnormalities. IMPRESSION: 1. Recent pancreatitis with multiple organized fluid collections in the abdomen as listed and measured above. The largest measures 15 x 4 x 6 cm. Collections exert mass effect on the stomach, transverse colon, descending colon without obstruction. 2. Stable left posterior pararenal fat necrosis. 3. At least moderate left pleural effusion with multi segment atelectasis. Electronically Signed   By: Monte Fantasia M.D.   On: 07/05/2019 07:12   US Thoracentesis Asp Pleural Space W/img Guide  Result Date: 07/05/2019 INDICATION: Patient with history of recurrent pancreatitis, GERD, dyspnea, recurrent left pleural effusion. Request made for diagnostic and therapeutic left thoracentesis. EXAM: ULTRASOUND GUIDED DIAGNOSTIC AND THERAPEUTIC LEFT THORACENTESIS MEDICATIONS: None COMPLICATIONS: None immediate. PROCEDURE: An ultrasound guided thoracentesis was thoroughly discussed with the patient and questions answered. The benefits, risks,  alternatives and complications were also discussed. The patient understands and wishes to proceed with the procedure. Written consent was obtained. Ultrasound was performed to localize and mark an adequate pocket of fluid in the left chest. The area was then prepped and draped in the normal sterile fashion. 1% Lidocaine was used for local anesthesia. Under ultrasound guidance a 6 Fr Safe-T-Centesis catheter was introduced. Thoracentesis was performed. The catheter was removed and a dressing applied. FINDINGS: A total of approximately 770 cc of yellow fluid was removed. Samples were sent to the laboratory as requested by the clinical team. IMPRESSION: Successful ultrasound guided diagnostic and therapeutic  left thoracentesis yielding 770 cc of pleural fluid. Read by: Rowe Robert, PA-C Electronically Signed   By: Markus Daft M.D.   On: 07/05/2019 16:12        Scheduled Meds:  Chlorhexidine Gluconate Cloth  6 each Topical Q0600   enoxaparin (LOVENOX) injection  40 mg Subcutaneous Q24H   feeding supplement  1 Container Oral TID BM   guaiFENesin  600 mg Oral BID   lip balm  1 application Topical BID   metoprolol tartrate  25 mg Oral BID   morphine   Intravenous Q4H   Continuous Infusions:  sodium chloride     famotidine (PEPCID) IV Stopped (07/06/19 0936)   meropenem (MERREM) IV Stopped (07/06/19 0640)     LOS: 1 day    Time spent: 38 minutes.     Hosie Poisson, MD [facility] Pager [phone]   If 7PM-7AM, please contact night-coverage [email or web address] Password [facility] 07/06/2019, 11:50 AM

## 2019-07-06 NOTE — Progress Notes (Signed)
Patient ID: Kyle Mooney, male   DOB: Feb 19, 1971, 48 y.o.   MRN: [id number]       Subjective: Patient is a bit fidgety this morning, but states he isn't having any abdominal pain really.  Breathing is better after thoracentesis yesterday.  Objective: Vital signs in last 24 hours: Temp:  [97.3 F (36.3 C)-97.9 F (36.6 C)] 97.3 F (36.3 C) (08/17 0800) Pulse Rate:  [103-137] 103 (08/17 0600) Resp:  [14-42] 14 (08/17 0600) BP: (113-189)/(80-153) 135/84 (08/17 0600) SpO2:  [94 %-100 %] 97 % (08/17 0600) FiO2 (%):  [33 %] 33 % (08/16 1839) Weight:  [77.2 kg] 77.2 kg (08/17 0500)    Intake/Output from previous day: 08/16 0701 - 08/17 0700 In: 5531 [P.O.:120; I.V.:3339.8; IV Piggyback:2071.1] Out: 250 [Urine:250] Intake/Output this shift: Total I/O In: -  Out: 150 [Urine:150]  PE: Heart: regular, mildly tachy Lungs: CTAB Abd: soft, mild epigastric tenderness, hypoactive BS, ND  Lab Results:  Recent Labs    07/05/19 0420 07/06/19 0212  WBC 28.4* 25.4*  HGB 13.3 11.7*  HCT 42.5 38.5*  PLT 1,238* 905*   BMET Recent Labs    07/05/19 0420 07/06/19 0212  NA 131* 133*  K 3.8 4.4  CL 91* 98  CO2 27 24  GLUCOSE 165* 122*  BUN 9 14  CREATININE 0.88 0.77  CALCIUM 9.1 8.7*   PT/INR Recent Labs    07/05/19 0420  LABPROT 15.3*  INR 1.2   CMP     Component Value Date/Time   NA 133 (L) 07/06/2019 0212   K 4.4 07/06/2019 0212   CL 98 07/06/2019 0212   CO2 24 07/06/2019 0212   GLUCOSE 122 (H) 07/06/2019 0212   BUN 14 07/06/2019 0212   CREATININE 0.77 07/06/2019 0212   CALCIUM 8.7 (L) 07/06/2019 0212   PROT 5.9 (L) 07/06/2019 0212   ALBUMIN 1.8 (L) 07/06/2019 0212   AST 14 (L) 07/06/2019 0212   ALT 20 07/06/2019 0212   ALKPHOS 102 07/06/2019 0212   BILITOT 0.5 07/06/2019 0212   GFRNONAA >60 07/06/2019 0212   GFRAA >60 07/06/2019 0212   Lipase     Component Value Date/Time   LIPASE 134 (H) 07/06/2019 0212       Studies/Results: Ct Angio Chest Pe  W/cm &/or Wo Cm  Result Date: 07/05/2019 CLINICAL DATA:  Chest pain.  Complicated pancreatitis. EXAM: CT ANGIOGRAPHY CHEST WITH CONTRAST TECHNIQUE: Multidetector CT imaging of the chest was performed using the standard protocol during bolus administration of intravenous contrast. Multiplanar CT image reconstructions and MIPs were obtained to evaluate the vascular anatomy. CONTRAST:  142mL OMNIPAQUE IOHEXOL 350 MG/ML SOLN COMPARISON:  Chest CT from 10 days ago FINDINGS: Cardiovascular: Satisfactory opacification of the pulmonary arteries to the segmental level. No evidence of pulmonary embolism when accounting for intermittent motion artifact. Normal heart size. No pericardial effusion. Aberrant right subclavian artery Mediastinum/Nodes: Negative for adenopathy or mass. Lungs/Pleura: Large left pleural effusion that is layering. Trace right pleural fluid. Near complete collapse of the left lower lobe. No consolidation or edema. Upper Abdomen: As reported separately. Musculoskeletal: No acute or aggressive finding Review of the MIP images confirms the above findings. IMPRESSION: 1. Negative for pulmonary embolism. 2. Large layering left pleural effusion with near complete lower lobe collapse. Electronically Signed   By: Monte Fantasia M.D.   On: 07/05/2019 07:16   Dg Chest Port 1 View  Result Date: 07/05/2019 CLINICAL DATA:  Status post left-sided thoracentesis EXAM: PORTABLE CHEST 1 VIEW COMPARISON:  July 05, 2019 FINDINGS: The left-sided pleural effusion has significantly decreased in size from the prior study. There is no evidence for left-sided pneumothorax. Bibasilar airspace opacities are noted, left worse than right. The lung volumes are low. The heart size is stable from prior study. IMPRESSION: 1. No pneumothorax status post left-sided thoracentesis. 2. Significant interval decrease in size of the left-sided pleural effusion. 3. Bibasilar airspace opacities, left greater than right, favored to  represent atelectasis. 4. Low lung volumes. Electronically Signed   By: Constance Holster M.D.   On: 07/05/2019 16:31   Dg Chest Portable 1 View  Result Date: 07/05/2019 CLINICAL DATA:  Chest pain. EXAM: PORTABLE CHEST 1 VIEW COMPARISON:  Radiograph 06/26/2019, CT 06/25/2019 FINDINGS: Re-accumulation of left pleural effusion after thoracentesis. Pleural effusion is moderate in size with associated underlying left lung base atelectasis/consolidation. Possible small right pleural effusion. Unchanged heart size and mediastinal contours. Increasing patchy right lung base opacity. No pneumothorax. IMPRESSION: 1. Re-accumulation of left pleural effusion after thoracentesis, now moderate in size. Associated left lung base atelectasis/consolidation. 2. Possible small right pleural effusion. Increasing right lung base opacity may be atelectasis or pneumonia. Electronically Signed   By: Keith Rake M.D.   On: 07/05/2019 03:22   Ct Renal Stone Study  Result Date: 07/05/2019 CLINICAL DATA:  Chest pain. EXAM: CT ABDOMEN AND PELVIS WITHOUT CONTRAST TECHNIQUE: Multidetector CT imaging of the abdomen and pelvis was performed following the standard protocol without IV contrast. COMPARISON:  Fifteen days ago FINDINGS: Lower chest: At least moderate left pleural effusion with multi segment atelectasis. Pending chest CT. Hepatobiliary: No focal liver abnormality.Cholecystectomy. Pancreas: History of recurrent pancreatitis. Progressive organized collections: 1. 8 x 4 cm along the upper greater curvature of the stomach 2. 6 cm along the mid greater curvature of the stomach-which may be connected to #1. 3. Peripancreatic to splenic tail, 15 x 4 by up to 6 cm-with further extent tracking inferiorly along the descending colon. 4. Ligamentum venosum of the liver measuring 5 x 3 cm 5. Ventral midline abdomen at 5 cm 6. Lower ventral midline abdomen with interloop distortion, 9 cm. These collections exert mass effect on the  stomach, transverse colon, and descending colon. Fat density with rim of stranding posterior to the left kidney in the posterior pararenal space, stable in compatible with fat necrosis. There is generalized retroperitoneal edema about the collections and pancreas. No gas within any of the collections to suggest fistula or definite superinfection. Spleen: Stable size and density. Adrenals/Urinary Tract: Negative adrenals. No hydronephrosis or stone. Unremarkable bladder. Stomach/Bowel: No obstruction. Low-density about the hepatic flexure is likely a combination of reactive wall thickening and pericolonic fluid. Vascular/Lymphatic: No visible acute vascular abnormality. No noted adenopathy. Reproductive:Negative Other: Right upper quadrant ascites which may be loculated. Musculoskeletal: No acute abnormalities. IMPRESSION: 1. Recent pancreatitis with multiple organized fluid collections in the abdomen as listed and measured above. The largest measures 15 x 4 x 6 cm. Collections exert mass effect on the stomach, transverse colon, descending colon without obstruction. 2. Stable left posterior pararenal fat necrosis. 3. At least moderate left pleural effusion with multi segment atelectasis. Electronically Signed   By: Monte Fantasia M.D.   On: 07/05/2019 07:12   US Thoracentesis Asp Pleural Space W/img Guide  Result Date: 07/05/2019 INDICATION: Patient with history of recurrent pancreatitis, GERD, dyspnea, recurrent left pleural effusion. Request made for diagnostic and therapeutic left thoracentesis. EXAM: ULTRASOUND GUIDED DIAGNOSTIC AND THERAPEUTIC LEFT THORACENTESIS MEDICATIONS: None COMPLICATIONS: None immediate. PROCEDURE: An ultrasound  guided thoracentesis was thoroughly discussed with the patient and questions answered. The benefits, risks, alternatives and complications were also discussed. The patient understands and wishes to proceed with the procedure. Written consent was obtained. Ultrasound was  performed to localize and mark an adequate pocket of fluid in the left chest. The area was then prepped and draped in the normal sterile fashion. 1% Lidocaine was used for local anesthesia. Under ultrasound guidance a 6 Fr Safe-T-Centesis catheter was introduced. Thoracentesis was performed. The catheter was removed and a dressing applied. FINDINGS: A total of approximately 770 cc of yellow fluid was removed. Samples were sent to the laboratory as requested by the clinical team. IMPRESSION: Successful ultrasound guided diagnostic and therapeutic left thoracentesis yielding 770 cc of pleural fluid. Read by: Rowe Robert, PA-C Electronically Signed   By: Markus Daft M.D.   On: 07/05/2019 16:12    Anti-infectives: Anti-infectives (From admission, onward)   Start     Dose/Rate Route Frequency Ordered Stop   07/05/19 1800  ceFEPIme (MAXIPIME) 2 g in sodium chloride 0.9 % 100 mL IVPB  Status:  Discontinued     2 g 200 mL/hr over 30 Minutes Intravenous Every 8 hours 07/05/19 0852 07/05/19 1013   07/05/19 1400  meropenem (MERREM) 1 g in sodium chloride 0.9 % 100 mL IVPB     1 g 200 mL/hr over 30 Minutes Intravenous Every 8 hours 07/05/19 1013 07/15/19 1359   07/05/19 1000  metroNIDAZOLE (FLAGYL) IVPB 500 mg  Status:  Discontinued     500 mg 100 mL/hr over 60 Minutes Intravenous Every 8 hours 07/05/19 0833 07/05/19 1013   07/05/19 0345  vancomycin (VANCOCIN) IVPB 1000 mg/200 mL premix  Status:  Discontinued     1,000 mg 200 mL/hr over 60 Minutes Intravenous  Once 07/05/19 0333 07/05/19 0336   07/05/19 0345  ceFEPIme (MAXIPIME) 2 g in sodium chloride 0.9 % 100 mL IVPB     2 g 200 mL/hr over 30 Minutes Intravenous  Once 07/05/19 0333 07/05/19 1001   07/05/19 0345  vancomycin (VANCOCIN) 1,500 mg in sodium chloride 0.9 % 500 mL IVPB     1,500 mg 250 mL/hr over 120 Minutes Intravenous  Once 07/05/19 0336 07/05/19 1001       Assessment/Plan Pancreatitis with pseudocysts  -patient states he was eating  at home well, but prealbumin is 6 so that does not correlate. -given his abdominal pain is not too bad and his lipase is trending back down, could try clear liquids as well as breeze to try and boost nutrition.  If unable to tolerate, he will likely require a cortrak with postpyloric tube feeds. -ideally these pseudocysts will need to mature for at least 6 weeks and then could attempt cystogastrostomy either laparoscopically or endoscopically.   -patient could follow up with Dr. Johney Maine if he wanted or return to [facility] as previously scheduled. -cont abx therapy.  WBC improving Left pleural effusion -breathing improved after left thoracentesis yesterday  FEN - IVFs,CLD, breeze VTE - Lovenox ID - Merrem   LOS: 1 day    Henreitta Cea , [facility] 07/06/2019, 8:15 AM Pager: [phone]

## 2019-07-06 NOTE — Progress Notes (Addendum)
UNASSIGNED PATIENT Subjective: [name] is a 48 year old white male with recurrent idiopathicpancreatitis GERD and IBS is followed by [facility].  He has had pancreatic cyst since 07/05/2019 with recurrent epigastric pain suspicious for acute on chronic pancreatitis.  He was also noted to have large layering pleural effusion which has been tapped.  Patient claims he feels much better today and denies having any abdominal pain nausea vomiting.  Objective: Vital signs in last 24 hours: Temp:  [97.3 F (36.3 C)-98.4 F (36.9 C)] 98.4 F (36.9 C) (08/17 1200) Pulse Rate:  [102-123] 116 (08/17 1200) Resp:  [14-28] 22 (08/17 1525) BP: (112-168)/(61-114) 112/61 (08/17 1200) SpO2:  [94 %-100 %] 100 % (08/17 1525) FiO2 (%):  [33 %] 33 % (08/16 1839) Weight:  [77.2 kg] 77.2 kg (08/17 0500) Last BM Date: 07/05/19  Intake/Output from previous day: 08/16 0701 - 08/17 0700 In: 5531 [P.O.:120; I.V.:3339.8; IV Piggyback:2071.1] Out: 250 [Urine:250] Intake/Output this shift: Total I/O In: -  Out: 150 [Urine:150]  General appearance: cooperative, appears older than stated age, cachectic, fatigued, no distress and pale Resp: clear to auscultation bilaterally Cardio: regular rate and rhythm, S1, S2 normal, no murmur, click, rub or gallop GI: soft, non-tender; bowel sounds normal; no masses,  no organomegaly Extremities: extremities normal, atraumatic, no cyanosis or edema  Lab Results: Recent Labs    07/05/19 0420 07/06/19 0212  WBC 28.4* 25.4*  HGB 13.3 11.7*  HCT 42.5 38.5*  PLT 1,238* 905*   BMET Recent Labs    07/05/19 0420 07/06/19 0212  NA 131* 133*  K 3.8 4.4  CL 91* 98  CO2 27 24  GLUCOSE 165* 122*  BUN 9 14  CREATININE 0.88 0.77  CALCIUM 9.1 8.7*   LFT Recent Labs    07/06/19 0212  PROT 5.9*  ALBUMIN 1.8*  AST 14*  ALT 20  ALKPHOS 102  BILITOT 0.5   PT/INR Recent Labs    07/05/19 0420  LABPROT 15.3*  INR 1.2   Studies/Results: Ct  Angio Chest Pe W/cm &/or Wo Cm  Result Date: 07/05/2019 CLINICAL DATA:  Chest pain.  Complicated pancreatitis. EXAM: CT ANGIOGRAPHY CHEST WITH CONTRAST TECHNIQUE: Multidetector CT imaging of the chest was performed using the standard protocol during bolus administration of intravenous contrast. Multiplanar CT image reconstructions and MIPs were obtained to evaluate the vascular anatomy. CONTRAST:  14mL OMNIPAQUE IOHEXOL 350 MG/ML SOLN COMPARISON:  Chest CT from 10 days ago FINDINGS: Cardiovascular: Satisfactory opacification of the pulmonary arteries to the segmental level. No evidence of pulmonary embolism when accounting for intermittent motion artifact. Normal heart size. No pericardial effusion. Aberrant right subclavian artery Mediastinum/Nodes: Negative for adenopathy or mass. Lungs/Pleura: Large left pleural effusion that is layering. Trace right pleural fluid. Near complete collapse of the left lower lobe. No consolidation or edema. Upper Abdomen: As reported separately. Musculoskeletal: No acute or aggressive finding Review of the MIP images confirms the above findings. IMPRESSION: 1. Negative for pulmonary embolism. 2. Large layering left pleural effusion with near complete lower lobe collapse. Electronically Signed   By: Monte Fantasia M.D.   On: 07/05/2019 07:16   Dg Chest Port 1 View  Result Date: 07/05/2019 CLINICAL DATA:  Status post left-sided thoracentesis EXAM: PORTABLE CHEST 1 VIEW COMPARISON:  July 05, 2019 FINDINGS: The left-sided pleural effusion has significantly decreased in size from the prior study. There is no evidence for left-sided pneumothorax. Bibasilar airspace opacities are noted, left worse than right. The lung volumes are low. The heart  size is stable from prior study. IMPRESSION: 1. No pneumothorax status post left-sided thoracentesis. 2. Significant interval decrease in size of the left-sided pleural effusion. 3. Bibasilar airspace opacities, left greater than right,  favored to represent atelectasis. 4. Low lung volumes. Electronically Signed   By: Constance Holster M.D.   On: 07/05/2019 16:31   Dg Chest Portable 1 View  Result Date: 07/05/2019 CLINICAL DATA:  Chest pain. EXAM: PORTABLE CHEST 1 VIEW COMPARISON:  Radiograph 06/26/2019, CT 06/25/2019 FINDINGS: Re-accumulation of left pleural effusion after thoracentesis. Pleural effusion is moderate in size with associated underlying left lung base atelectasis/consolidation. Possible small right pleural effusion. Unchanged heart size and mediastinal contours. Increasing patchy right lung base opacity. No pneumothorax. IMPRESSION: 1. Re-accumulation of left pleural effusion after thoracentesis, now moderate in size. Associated left lung base atelectasis/consolidation. 2. Possible small right pleural effusion. Increasing right lung base opacity may be atelectasis or pneumonia. Electronically Signed   By: Keith Rake M.D.   On: 07/05/2019 03:22   Ct Renal Stone Study  Result Date: 07/05/2019 CLINICAL DATA:  Chest pain. EXAM: CT ABDOMEN AND PELVIS WITHOUT CONTRAST TECHNIQUE: Multidetector CT imaging of the abdomen and pelvis was performed following the standard protocol without IV contrast. COMPARISON:  Fifteen days ago FINDINGS: Lower chest: At least moderate left pleural effusion with multi segment atelectasis. Pending chest CT. Hepatobiliary: No focal liver abnormality.Cholecystectomy. Pancreas: History of recurrent pancreatitis. Progressive organized collections: 1. 8 x 4 cm along the upper greater curvature of the stomach 2. 6 cm along the mid greater curvature of the stomach-which may be connected to #1. 3. Peripancreatic to splenic tail, 15 x 4 by up to 6 cm-with further extent tracking inferiorly along the descending colon. 4. Ligamentum venosum of the liver measuring 5 x 3 cm 5. Ventral midline abdomen at 5 cm 6. Lower ventral midline abdomen with interloop distortion, 9 cm. These collections exert mass effect  on the stomach, transverse colon, and descending colon. Fat density with rim of stranding posterior to the left kidney in the posterior pararenal space, stable in compatible with fat necrosis. There is generalized retroperitoneal edema about the collections and pancreas. No gas within any of the collections to suggest fistula or definite superinfection. Spleen: Stable size and density. Adrenals/Urinary Tract: Negative adrenals. No hydronephrosis or stone. Unremarkable bladder. Stomach/Bowel: No obstruction. Low-density about the hepatic flexure is likely a combination of reactive wall thickening and pericolonic fluid. Vascular/Lymphatic: No visible acute vascular abnormality. No noted adenopathy. Reproductive:Negative Other: Right upper quadrant ascites which may be loculated. Musculoskeletal: No acute abnormalities. IMPRESSION: 1. Recent pancreatitis with multiple organized fluid collections in the abdomen as listed and measured above. The largest measures 15 x 4 x 6 cm. Collections exert mass effect on the stomach, transverse colon, descending colon without obstruction. 2. Stable left posterior pararenal fat necrosis. 3. At least moderate left pleural effusion with multi segment atelectasis. Electronically Signed   By: Monte Fantasia M.D.   On: 07/05/2019 07:12   US Thoracentesis Asp Pleural Space W/img Guide  Result Date: 07/05/2019 INDICATION: Patient with history of recurrent pancreatitis, GERD, dyspnea, recurrent left pleural effusion. Request made for diagnostic and therapeutic left thoracentesis. EXAM: ULTRASOUND GUIDED DIAGNOSTIC AND THERAPEUTIC LEFT THORACENTESIS MEDICATIONS: None COMPLICATIONS: None immediate. PROCEDURE: An ultrasound guided thoracentesis was thoroughly discussed with the patient and questions answered. The benefits, risks, alternatives and complications were also discussed. The patient understands and wishes to proceed with the procedure. Written consent was obtained. Ultrasound was  performed to localize  and mark an adequate pocket of fluid in the left chest. The area was then prepped and draped in the normal sterile fashion. 1% Lidocaine was used for local anesthesia. Under ultrasound guidance a 6 Fr Safe-T-Centesis catheter was introduced. Thoracentesis was performed. The catheter was removed and a dressing applied. FINDINGS: A total of approximately 770 cc of yellow fluid was removed. Samples were sent to the laboratory as requested by the clinical team. IMPRESSION: Successful ultrasound guided diagnostic and therapeutic left thoracentesis yielding 770 cc of pleural fluid. Read by: Rowe Robert, PA-C Electronically Signed   By: Markus Daft M.D.   On: 07/05/2019 16:12   Medications: I have reviewed the patient's current medications.  Assessment/Plan: 1) ?Idiopathic acute pancreatitis [place] is a history of alcohol use] with uninfected necrosis/abnormal CT scan showing extensive retroperitoneal inflammation multiple peripancreatic fluid collections with a new discrete collection along the greater curvature of the stomach measuring 6.2 x 2.7cm along with splenomegaly 14.4 cm and anasarca.  There was a large left pleural effusion that has been tapped. Agree with supportive care as outlined above there is not much to do from a GI standpoint.  2) History of gastroesophageal reflux/IBS. 3) History of tobacco and alcohol abuse-binge drinking.   LOS: 1 day   Juanita Craver 07/06/2019, 3:36 PM

## 2019-07-07 DIAGNOSIS — E43 Unspecified severe protein-calorie malnutrition: Secondary | ICD-10-CM | POA: Diagnosis present

## 2019-07-07 LAB — PH, BODY FLUID: pH, Body Fluid: 7.3

## 2019-07-07 LAB — COMPREHENSIVE METABOLIC PANEL
ALT: 16 U/L (ref 0–44)
AST: 14 U/L — ABNORMAL LOW (ref 15–41)
Albumin: 1.8 g/dL — ABNORMAL LOW (ref 3.5–5.0)
Alkaline Phosphatase: 107 U/L (ref 38–126)
Anion gap: 9 (ref 5–15)
BUN: 12 mg/dL (ref 6–20)
CO2: 27 mmol/L (ref 22–32)
Calcium: 8.6 mg/dL — ABNORMAL LOW (ref 8.9–10.3)
Chloride: 96 mmol/L — ABNORMAL LOW (ref 98–111)
Creatinine, Ser: 0.63 mg/dL (ref 0.61–1.24)
GFR calc Af Amer: >60 mL/min (ref >60)
GFR calc non Af Amer: >60 mL/min (ref >60)
Glucose, Bld: 108 mg/dL — ABNORMAL HIGH (ref 70–99)
Potassium: 4.1 mmol/L (ref 3.5–5.1)
Sodium: 132 mmol/L — ABNORMAL LOW (ref 135–145)
Total Bilirubin: 0.7 mg/dL (ref 0.3–1.2)
Total Protein: 6.1 g/dL — ABNORMAL LOW (ref 6.5–8.1)

## 2019-07-07 LAB — AMYLASE, PLEURAL OR PERITONEAL FLUID: Amylase, Fluid: 1529 U/L

## 2019-07-07 LAB — IGG 1, 2, 3, AND 4
IgG (Immunoglobin G), Serum: 1527 mg/dL (ref 603–1613)
IgG, Subclass 1: 669 mg/dL (ref 248–810)
IgG, Subclass 2: 717 mg/dL — ABNORMAL HIGH (ref 130–555)
IgG, Subclass 3: 77 mg/dL (ref 15–102)
IgG, Subclass 4: 24 mg/dL (ref 2–96)

## 2019-07-07 LAB — CBC
HCT: 34.2 % — ABNORMAL LOW (ref 39.0–52.0)
Hemoglobin: 10.4 g/dL — ABNORMAL LOW (ref 13.0–17.0)
MCH: 26.7 pg (ref 26.0–34.0)
MCHC: 30.4 g/dL (ref 30.0–36.0)
MCV: 87.7 fL (ref 80.0–100.0)
Platelets: 702 10*3/uL — ABNORMAL HIGH (ref 150–400)
RBC: 3.9 MIL/uL — ABNORMAL LOW (ref 4.22–5.81)
RDW: 13.7 % (ref 11.5–15.5)
WBC: 20 10*3/uL — ABNORMAL HIGH (ref 4.0–10.5)
nRBC: 0 % (ref 0.0–0.2)

## 2019-07-07 LAB — LIPASE, BLOOD: Lipase: 95 U/L — ABNORMAL HIGH (ref 11–51)

## 2019-07-07 LAB — IGG 4: IgG, Subclass 4: 23 mg/dL (ref 2–96)

## 2019-07-07 MED ORDER — PRO-STAT SUGAR FREE PO LIQD
30.0000 mL | Freq: Two times a day (BID) | ORAL | Status: DC
  Administered 2019-07-07 (×2): 30 mL via ORAL
  Filled 2019-07-07 (×2): qty 30

## 2019-07-07 MED ORDER — LORAZEPAM 2 MG/ML IJ SOLN: 1.0000 mg | Freq: Four times a day (QID) | INTRAMUSCULAR | Status: AC | PRN

## 2019-07-07 MED ORDER — FOLIC ACID 1 MG PO TABS
1.0000 mg | ORAL_TABLET | Freq: Every day | ORAL | Status: DC
  Administered 2019-07-07 – 2019-07-14 (×8): 1 mg via ORAL
  Filled 2019-07-07 (×8): qty 1

## 2019-07-07 MED ORDER — ADULT MULTIVITAMIN W/MINERALS CH
1.0000 | ORAL_TABLET | Freq: Every day | ORAL | Status: DC
  Administered 2019-07-07: 10:00:00 1 via ORAL
  Filled 2019-07-07: qty 1

## 2019-07-07 MED ORDER — THIAMINE HCL 100 MG/ML IJ SOLN: 100.0000 mg | Freq: Every day | INTRAMUSCULAR | Status: DC

## 2019-07-07 MED ORDER — LORAZEPAM 1 MG PO TABS: 1.0000 mg | ORAL_TABLET | Freq: Four times a day (QID) | ORAL | Status: AC | PRN

## 2019-07-07 MED ORDER — VITAMIN B-1 100 MG PO TABS
100.0000 mg | ORAL_TABLET | Freq: Every day | ORAL | Status: DC
  Administered 2019-07-07 – 2019-07-14 (×8): 100 mg via ORAL
  Filled 2019-07-07 (×8): qty 1

## 2019-07-07 MED ORDER — ADULT MULTIVITAMIN W/MINERALS CH
1.0000 | ORAL_TABLET | Freq: Every day | ORAL | Status: DC
  Administered 2019-07-07 – 2019-07-09 (×3): 1 via ORAL
  Filled 2019-07-07 (×3): qty 1

## 2019-07-07 NOTE — Progress Notes (Signed)
PT Cancellation Note  Patient Details Name: Brayen Bunn MRN: [id number] DOB: [birth year]   Cancelled Treatment:    Reason Eval/Treat Not Completed: Other (comment)(transferring units) Will check back as schedule permits.   Baylea Milburn,KATHrine E 07/07/2019, 11:08 AM Carmelia Bake, PT, DPT Acute Rehabilitation Services Office: [phone] Pager: [phone]

## 2019-07-07 NOTE — Progress Notes (Signed)
   07/07/19 1527  MEWS Score  Resp (!) 24  Pulse Rate (!) 128  BP 124/81  SpO2 97 %  O2 Device Nasal Cannula  O2 Flow Rate (L/min) 2 L/min  MEWS Score  MEWS RR 1  MEWS Pulse 2  MEWS Systolic 0  MEWS LOC 0  MEWS Temp 0  MEWS Score 3  MEWS Score Color Yellow  MEWS Assessment  Is this an acute change? Yes  Provider Notification  Provider Name/Title Dr. Karleen Hampshire  Date Provider Notified 07/07/19  Time Provider Notified 1532  Notification Type Page  Notification Reason Change in status (pulse 120s)  Dr. Karleen Hampshire notified via text page of change in VS.  Patient's heart rate 130s when up to bathroom.  Patient resting in bed - heart rate now 120s.  Patient reports abdominal pain 6/10.  Dr. Karleen Hampshire stated she will come and see patient.

## 2019-07-07 NOTE — Progress Notes (Signed)
Initial Nutrition Assessment  RD working remotely.   DOCUMENTATION CODES:   Not applicable  INTERVENTION:  - continue Boost Breeze TID, each supplement provides 250 kcal and 9 grams of protein. - will order 30 mL Prostat BID, each supplement provides 100 kcal and 15 grams of protein. - will order daily multivitamin with minerals. - diet advancement as medically feasible.    NUTRITION DIAGNOSIS:   Inadequate protein intake related to other (see comment)(current diet order) as evidenced by other (comment)(CLD does not meet estimated protein need).  GOAL:   Patient will meet greater than or equal to 90% of their needs  MONITOR:   PO intake, Supplement acceptance, Diet advancement, Labs, Weight trends  REASON FOR ASSESSMENT:   Consult Assessment of nutrition requirement/status  ASSESSMENT:   48 year old gentleman with prior history of recurrent pancreatitis, IBS, and GERD. He follows up with [facility] for his idiopathic pancreatitis and pancreatic cysts. He has been experiencing recurrent epigastric pain suspicious for acute on chronic pancreatitis. He underwent CT chest which was negative for pulmonary embolism, but did show large layering L pleural effusion with near complete lower lobe collapse. IR consulted and he underwent thoracentesis for left pleural effusion; fluid sent for analysis. Surgery has been consulted for evaluation of the pseudocyst of the pancreas and recommended to wait 6 weeks for the pseudocyst to mature and then follow-up with Dr. Johney Maine versus follow-up with [facility] for further evaluation. Patient is currently on IV PCA morphine and his pain is well controlled.  Diet advanced from NPO to CLD yesterday at 8:23 AM with no PO intakes documented since that time. Patient accepted all 3 cartons of boost breeze yesterday. Patient was admitted earlier in the month and assessed by other RDs on 8/4 and 8/7. At that time patient was eating very poorly and complaining of  ongoing severe abdominal pain.   Patient was unable to be reached by phone this AM and note from 8/4 indicates patient was unable to be reached x2. Current weight is 172 lb and weight on 8/2 was 165 lb (appears to have been a stated weight). On 6/27 at [facility] he weighed 176 lb. This indicates 4 lb weight loss (2.3% body weight) in the past ~2 months; not significant.  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 Medications reviewed; 20 mg IV pepcid BID.     NUTRITION - FOCUSED PHYSICAL EXAM:  unable to complete at this time.   Diet Order:   Diet Order            Diet clear liquid Room service appropriate? Yes; Fluid consistency: Thin  Diet effective now              EDUCATION NEEDS:   No education needs have been identified at this time  Skin:     Last BM:  8/16  Height:   Ht Readings from Last 1 Encounters:  07/05/19 6\' 1"  (1.854 m)    Weight:   Wt Readings from Last 1 Encounters:  07/07/19 78.1 kg    Ideal Body Weight:  83.6 kg  BMI:  Body mass index is 22.72 kg/m.  Estimated Nutritional Needs:   Kcal:  2200-2400 kcal  Protein:  110-125 grams  Fluid:  >/= 2 L/day     Jarome Matin, MS, RD, LDN, [facility] Inpatient Clinical Dietitian Pager # [phone] After hours/weekend pager # [phone]

## 2019-07-07 NOTE — Progress Notes (Addendum)
Patient ID: Kyle Mooney, male   DOB: Nov 10, 1971, 48 y.o.   MRN: [id number]       Subjective: Patient with more pain today.  Gets some more pain after eating, but in general not as good today.  He is more alert today than yesterday.  States his care is through [facility] and he is only here and at [facility] earlier this year because that is where EMS has taken him.  He is scheduled to follow up soon for an MRCP and other possible procedures with GI at [facility] on 8/24  Objective: Vital signs in last 24 hours: Temp:  [97.6 F (36.4 C)-98.4 F (36.9 C)] 98.2 F (36.8 C) (08/18 0736) Pulse Rate:  [97-117] 110 (08/18 0700) Resp:  [14-39] 19 (08/18 0736) BP: (106-155)/(61-94) 140/70 (08/18 0700) SpO2:  [97 %-100 %] 99 % (08/18 0736) FiO2 (%):  [33 %] 33 % (08/18 0736) Weight:  [78.1 kg] 78.1 kg (08/18 0500) Last BM Date: 07/05/19  Intake/Output from previous day: 08/17 0701 - 08/18 0700 In: 544.4 [I.V.:153; IV Piggyback:391.5] Out: 150 [Urine:150] Intake/Output this shift: No intake/output data recorded.  PE: Heart: tachy, but regular Lungs: CTAB  Abd: soft, but slightly more firm than yesterday, but more tender today in the upper abdomen, some BS  Lab Results:  Recent Labs    07/06/19 0212 07/07/19 0221  WBC 25.4* 20.0*  HGB 11.7* 10.4*  HCT 38.5* 34.2*  PLT 905* 702*   BMET Recent Labs    07/06/19 0212 07/07/19 0221  NA 133* 132*  K 4.4 4.1  CL 98 96*  CO2 24 27  GLUCOSE 122* 108*  BUN 14 12  CREATININE 0.77 0.63  CALCIUM 8.7* 8.6*   PT/INR Recent Labs    07/05/19 0420  LABPROT 15.3*  INR 1.2   CMP     Component Value Date/Time   NA 132 (L) 07/07/2019 0221   K 4.1 07/07/2019 0221   CL 96 (L) 07/07/2019 0221   CO2 27 07/07/2019 0221   GLUCOSE 108 (H) 07/07/2019 0221   BUN 12 07/07/2019 0221   CREATININE 0.63 07/07/2019 0221   CALCIUM 8.6 (L) 07/07/2019 0221   PROT 6.1 (L) 07/07/2019 0221   ALBUMIN 1.8 (L) 07/07/2019 0221   AST 14 (L) 07/07/2019 0221   ALT 16 07/07/2019 0221   ALKPHOS 107 07/07/2019 0221   BILITOT 0.7 07/07/2019 0221   GFRNONAA >60 07/07/2019 0221   GFRAA >60 07/07/2019 0221   Lipase     Component Value Date/Time   LIPASE 95 (H) 07/07/2019 0221       Studies/Results: Dg Chest Port 1 View  Result Date: 07/05/2019 CLINICAL DATA:  Status post left-sided thoracentesis EXAM: PORTABLE CHEST 1 VIEW COMPARISON:  July 05, 2019 FINDINGS: The left-sided pleural effusion has significantly decreased in size from the prior study. There is no evidence for left-sided pneumothorax. Bibasilar airspace opacities are noted, left worse than right. The lung volumes are low. The heart size is stable from prior study. IMPRESSION: 1. No pneumothorax status post left-sided thoracentesis. 2. Significant interval decrease in size of the left-sided pleural effusion. 3. Bibasilar airspace opacities, left greater than right, favored to represent atelectasis. 4. Low lung volumes. Electronically Signed   By: Constance Holster M.D.   On: 07/05/2019 16:31   US Thoracentesis Asp Pleural Space W/img Guide  Result Date: 07/05/2019 INDICATION: Patient with history of recurrent pancreatitis, GERD, dyspnea, recurrent left pleural effusion. Request made for diagnostic and therapeutic left thoracentesis. EXAM: ULTRASOUND GUIDED  DIAGNOSTIC AND THERAPEUTIC LEFT THORACENTESIS MEDICATIONS: None COMPLICATIONS: None immediate. PROCEDURE: An ultrasound guided thoracentesis was thoroughly discussed with the patient and questions answered. The benefits, risks, alternatives and complications were also discussed. The patient understands and wishes to proceed with the procedure. Written consent was obtained. Ultrasound was performed to localize and mark an adequate pocket of fluid in the left chest. The area was then prepped and draped in the normal sterile fashion. 1% Lidocaine was used for local anesthesia. Under ultrasound guidance a 6 Fr Safe-T-Centesis catheter was  introduced. Thoracentesis was performed. The catheter was removed and a dressing applied. FINDINGS: A total of approximately 770 cc of yellow fluid was removed. Samples were sent to the laboratory as requested by the clinical team. IMPRESSION: Successful ultrasound guided diagnostic and therapeutic left thoracentesis yielding 770 cc of pleural fluid. Read by: Rowe Robert, PA-C Electronically Signed   By: Markus Daft M.D.   On: 07/05/2019 16:12    Anti-infectives: Anti-infectives (From admission, onward)   Start     Dose/Rate Route Frequency Ordered Stop   07/05/19 1800  ceFEPIme (MAXIPIME) 2 g in sodium chloride 0.9 % 100 mL IVPB  Status:  Discontinued     2 g 200 mL/hr over 30 Minutes Intravenous Every 8 hours 07/05/19 0852 07/05/19 1013   07/05/19 1400  meropenem (MERREM) 1 g in sodium chloride 0.9 % 100 mL IVPB     1 g 200 mL/hr over 30 Minutes Intravenous Every 8 hours 07/05/19 1013 07/15/19 1359   07/05/19 1000  metroNIDAZOLE (FLAGYL) IVPB 500 mg  Status:  Discontinued     500 mg 100 mL/hr over 60 Minutes Intravenous Every 8 hours 07/05/19 0833 07/05/19 1013   07/05/19 0345  vancomycin (VANCOCIN) IVPB 1000 mg/200 mL premix  Status:  Discontinued     1,000 mg 200 mL/hr over 60 Minutes Intravenous  Once 07/05/19 0333 07/05/19 0336   07/05/19 0345  ceFEPIme (MAXIPIME) 2 g in sodium chloride 0.9 % 100 mL IVPB     2 g 200 mL/hr over 30 Minutes Intravenous  Once 07/05/19 0333 07/05/19 1001   07/05/19 0345  vancomycin (VANCOCIN) 1,500 mg in sodium chloride 0.9 % 500 mL IVPB     1,500 mg 250 mL/hr over 120 Minutes Intravenous  Once 07/05/19 0336 07/05/19 1001       Assessment/Plan Pancreatitis with pseudocysts   -patient states he is having more pain today.  Will decrease diet back to NPO x sips of liquids and ice chips.  He may end up need a cortrak and post pyloric TFs for nutrition  -ideally these pseudocysts will need to mature for at least 6 weeks and then could attempt  cystogastrostomy either laparoscopically or endoscopically.    -patient has established care at [facility].  He is only here because this is where EMS brought him.  He may benefit from transferring to [facility] for further care and continuity of care at this tertiary care facility. -while he is here we will continued to follow. -WBC is continuing to trend down, but given worsening pain today will do as above and make NPO  FEN - IVFs,NPO, breeze VTE - Lovenox ID - Merrem   LOS: 2 days    Henreitta Cea , [facility] Surgery 07/07/2019, 8:39 AM Pager: [phone]  Agree with above. Moving out of the ICU today.  He has known about the pseudocysts since May, 2020.  He has seen a Dr. Oren Beckmann at [place].  He lives by himself.  He is separated, though he says his wife drops by sometime.  Alphonsa Overall, MD, [facility] Surgery Pager: [phone] Office phone:  [phone]

## 2019-07-07 NOTE — Evaluation (Signed)
Physical Therapy Evaluation Patient Details Name: Kyle Mooney MRN: [id number] DOB: 09/01/1971 Today's Date: 07/07/2019   History of Present Illness  48 year old gentleman with prior history of recurrent pancreatitis IBS, GERD follows up with [facility] for his idiopathic pancreatitis and pancreatic cysts and admitted for idiopathic acute pancreatitis  Clinical Impression  Pt admitted with above diagnosis. Pt currently with functional limitations due to the deficits listed below (see PT Problem List). Pt will benefit from skilled PT to increase their independence and safety with mobility to allow discharge to the venue listed below.  Pt assisted with ambulated in hallway and fatigued quickly.  Pt would like to return to previous modified independent level since he states he is now home alone.  Pt eager to mobilize and recommended pt ambulate with nursing staff (at least while on PCA for safety) during acute stay.     Follow Up Recommendations Home health PT;Outpatient PT(OP if possible)    Equipment Recommendations  None recommended by PT    Recommendations for Other Services       Precautions / Restrictions Precautions Precautions: Fall Restrictions Weight Bearing Restrictions: No      Mobility  Bed Mobility Overal bed mobility: Needs Assistance Bed Mobility: Sidelying to Sit;Rolling Rolling: Min guard Sidelying to sit: Min assist       General bed mobility comments: verbal cues for log roll technique to assist with pain control; assist for trunk upright  Transfers Overall transfer level: Needs assistance Equipment used: Rolling walker (2 wheeled) Transfers: Sit to/from Stand Sit to Stand: Min guard;Min assist         General transfer comment: verbal cues for hand placement; assist from bed however min/guard from recliner  Ambulation/Gait Ambulation/Gait assistance: Min guard Gait Distance (Feet): 120 Feet Assistive device: Rolling walker (2 wheeled) Gait  Pattern/deviations: Step-through pattern;Decreased stride length     General Gait Details: steady with RW, effortful gait; HR 115 bpm, distance limited by fatigue and abdominal pain  Stairs            Wheelchair Mobility    Modified Rankin (Stroke Patients Only)       Balance Overall balance assessment: History of Falls;Needs assistance(reports one fall at home, uncertain of reason but states he was going around an object)         Standing balance support: Bilateral upper extremity supported Standing balance-Leahy Scale: Poor Standing balance comment: reliant on UE support                             Pertinent Vitals/Pain Pain Assessment: Faces Faces Pain Scale: Hurts even more Pain Location: R and L lower quadrant abdomen Pain Descriptors / Indicators: Aching;Discomfort Pain Intervention(s): Repositioned;PCA encouraged;Monitored during session;Limited activity within patient's tolerance    Home Living Family/patient expects to be discharged to:: Private residence Living Arrangements: Alone   Type of Home: House Home Access: Stairs to enter Entrance Stairs-Rails: Right Entrance Stairs-Number of Steps: 3 Home Layout: One level Home Equipment: Environmental consultant - 2 wheels      Prior Function Level of Independence: Independent with assistive device(s)         Comments: reports in/out of hospital lately, using RW currently     Hand Dominance        Extremity/Trunk Assessment        Lower Extremity Assessment Lower Extremity Assessment: Overall WFL for tasks assessed       Communication   Communication: No difficulties  Cognition Arousal/Alertness: Awake/alert Behavior During Therapy: WFL for tasks assessed/performed Overall Cognitive Status: Within Functional Limits for tasks assessed                                        General Comments      Exercises     Assessment/Plan    PT Assessment Patient needs continued PT  services  PT Problem List Decreased mobility;Decreased activity tolerance;Decreased balance;Decreased strength;Decreased knowledge of use of DME       PT Treatment Interventions DME instruction;Gait training;Balance training;Therapeutic exercise;Functional mobility training;Therapeutic activities;Patient/family education;Stair training    PT Goals (Current goals can be found in the Care Plan section)  Acute Rehab PT Goals PT Goal Formulation: With patient Time For Goal Achievement: 07/21/19 Potential to Achieve Goals: Good    Frequency Min 3X/week   Barriers to discharge        Co-evaluation               AM-PAC PT "6 Clicks" Mobility  Outcome Measure Help needed turning from your back to your side while in a flat bed without using bedrails?: A Little Help needed moving from lying on your back to sitting on the side of a flat bed without using bedrails?: A Little Help needed moving to and from a bed to a chair (including a wheelchair)?: A Little Help needed standing up from a chair using your arms (e.g., wheelchair or bedside chair)?: A Little Help needed to walk in hospital room?: A Little Help needed climbing 3-5 steps with a railing? : A Lot 6 Click Score: 17    End of Session Equipment Utilized During Treatment: Gait belt Activity Tolerance: Patient tolerated treatment well Patient left: in chair;with call bell/phone within reach(aware to use call bell for assist out of recliner)   PT Visit Diagnosis: Difficulty in walking, not elsewhere classified (R26.2)    Time: 3383-2919 PT Time Calculation (min) (ACUTE ONLY): 24 min   Charges:   PT Evaluation $PT Eval Low Complexity: [place], PT, DPT Acute Rehabilitation Services Office: [phone] Pager: [phone]  York Ram E 07/07/2019, 3:00 PM

## 2019-07-07 NOTE — Progress Notes (Signed)
PROGRESS NOTE    Kyle Mooney  Kyle Mooney:[id number] DOB: [birth year] DOA: 07/05/2019 PCP: Patient, No Pcp Per    Brief Narrative:  48 year old gentleman with prior history of recurrent pancreatitis IBS, GERD follows up with [facility] for his idiopathic pancreatitis and pancreatic cysts since on 8/16/20204 recurrent epigastric pain suspicious for acute on chronic pancreatitis.  He underwent CT of the chest which was  Negative for pulmonary embolism.  He was also found to have Large layering left pleural effusion with near complete lower lobe collapse.  IR consulted and he underwent thoracentesis for left pleural effusion and pleural fluid sent for analysis.  Meanwhile surgery consulted for evaluation of the pseudocyst of the pancreas,  recommended to wait for 6 weeks for the pseudocyst to mature and then follow-up with Dr. Johney Maine versus follow-up with [facility] Dr Oren Beckmann for further evaluation.  Patient is currently on IV PCA morphine and his pain appears to be optimally controlled. Since patient was following up at [facility] with Dr Oren Beckmann, called [facility] and requested transfer. Awaiting call .   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  GI consulted for recommendations on management of acute pancreatitis with pseudocyst. As per GI , he will need more aggressive options such as possible distal pancreatectomy at [facility], meanwhile continue with supportive care. Patient continues to be on morphine PCA.  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  Liver enzymes have normalized. Lipase levels improving with improvement in pain.    Anemia of chronic disease:  Baseline hemoglobin between 9 to 10.  About the same today.  Transfuse to keep hemoglobin greater than 7.    Left-sided large pleural effusion Recurrent,  s/p thoracentesis ON 8/16  and fluid analysis sent, appears to be exudative, gram stain and cultures are negative.  Patient is currently on 3 L of nasal cannula oxygen with good sats. We will continue with broad-spectrum antibiotics empirically with IV meropenam.    Leukocytosis and thrombocytosis suspect probably reactive from acute pancreatitis and marginalization. Improving .  Continue with broad-spectrum IV antibiotics to cover necrotizing pancreatitis.   Mild hyponatremia Probably from acute severe pancreatitis . Stable between 130- 133.  Asymptomatic.    Severe protein calorie malnutrition:  - pt is currently NPO for abdominal pain, if pain is controlled, he can be started on clears and nutrition will be consulted for supplementation.  - if his pain does not improve, he will need nasojejunal feeds.   Tobacco abuse Counseling for cessation provided.    Alcohol abuse On CIWA, except for tachycardia, no signs of withdrawal.    DVT prophylaxis: (Lovenox Code Status: Full code Family Communication: (None at bedside Disposition Plan: Pending clinical improvement and further evaluation  Consultants:   Surgery  Gastroenterology Dr. Cristina Gong  IR  Procedures: Thoracentesis on the left on admission.   Antimicrobials: Meropenem from 8/16/20202  Subjective: Pain sub optimally controlled.  Nausea better, no vomiting. No diarrhea.    Objective: Vitals:   07/07/19 1130 07/07/19 1134 07/07/19 1320 07/07/19 1527  BP:  123/79  122/73 124/81  Pulse: (!) 102  100 (!) 128  Resp: 20 20 20  (!) 24  Temp: 98.7 F (37.1 C)  98.7 F (37.1 C)   TempSrc:  Oral  Oral   SpO2: 96% 96% 95% 97%  Weight:      Height:   6\' 1"  (1.854 m)     Intake/Output Summary (Last 24 hours) at 07/07/2019 1554 Last data filed at 07/07/2019 1545 Gross per 24 hour  Intake 780.94 ml  Output 225 ml  Net 555.94 ml   Filed Weights   07/05/19 0244 07/06/19 0500 07/07/19 0500  Weight: 77.1 kg 77.2 kg 78.1 kg    Examination:  General exam: Uncomfortable but not in distress.,  Able to answer all questions appropriately. Respiratory system: Diminished air entry at bases, no wheezing or rhonchi, respiratory effort within normal limits Cardiovascular system: S1-S2 heard, tachycardic, no pedal edema Gastrointestinal system: Abdomen is soft mildly tender, generalized, nondistended, bowel sounds are good, no signs of peritonitis Central nervous system: Alert and oriented, grossly nonfocal Extremities: trace edema, no cyanosis or clubbing.  Skin: no rashes or lesions seen.  Psychiatry: Mood is appropriate.     Data Reviewed: I have personally reviewed following labs and imaging studies  CBC: Recent Labs  Lab 07/05/19 0420 07/06/19 0212 07/07/19 0221  WBC 28.4* 25.4* 20.0*  HGB 13.3 11.7* 10.4*  HCT 42.5 38.5* 34.2*  MCV 87.8 88.9 87.7  PLT 1,238* 905* 211*   Basic Metabolic Panel: Recent Labs  Lab 07/05/19 0420 07/06/19 0212 07/07/19 0221  NA 131* 133* 132*  K 3.8 4.4 4.1  CL 91* 98 96*  CO2 27 24 27   GLUCOSE 165* 122* 108*  BUN 9 14 12   CREATININE 0.88 0.77 0.63  CALCIUM 9.1 8.7* 8.6*   GFR: Estimated Creatinine Clearance: 126.1 mL/min (by C-G formula based on SCr of 0.63 mg/dL). Liver Function Tests: Recent Labs  Lab 07/05/19 0420 07/06/19 0212 07/07/19 0221  AST 24 14* 14*  ALT 33 20 16  ALKPHOS 146* 102 107  BILITOT 0.4 0.5 0.7  PROT 7.9 5.9* 6.1*  ALBUMIN 2.3* 1.8* 1.8*   Recent Labs  Lab 07/05/19 0420 07/06/19 0212 07/07/19 0221  LIPASE 329* 134* 95*  AMYLASE 1,026*  --   --    No results for input(s): AMMONIA in the  last 168 hours. Coagulation Profile: Recent Labs  Lab 07/05/19 0420  INR 1.2   Cardiac Enzymes: No results for input(s): CKTOTAL, CKMB, CKMBINDEX, TROPONINI in the last 168 hours. BNP (last 3 results) No results for input(s): PROBNP in the last 8760 hours. HbA1C: No results for input(s): HGBA1C in the last 72 hours. CBG: No results for input(s): GLUCAP in the last 168 hours. Lipid Profile: Recent Labs    07/05/19 0420  TRIG 139   Thyroid Function Tests: No results for input(s): TSH, T4TOTAL, FREET4, T3FREE, THYROIDAB in the last 72 hours. Anemia Panel: Recent Labs    07/05/19 0420  FERRITIN 939*   Sepsis Labs: Recent Labs  Lab 07/05/19 0420 07/05/19 0700  PROCALCITON 0.40  --   LATICACIDVEN 1.7 1.8    Recent Results (from the past 240 hour(s))  Urine culture     Status: None   Collection Time: 07/05/19  2:46 AM   Specimen: In/Out Cath Urine  Result Value Ref Range Status   Specimen Description   Final    IN/OUT CATH URINE Performed at [facility], [place] [address], [place], [place] [address]  Special Requests   Final    NONE Performed at [facility], [place] [address], [place], [place] [address]    Culture   Final    NO GROWTH Performed at [facility], [facility] [employer] [facility] [place] [address] [place], [place] [address]    Report Status 07/06/2019 FINAL  Final  Blood Culture (routine x 2)     Status: None (Preliminary result)   Collection Time: 07/05/19  3:33 AM   Specimen: BLOOD  Result Value Ref Range Status   Specimen Description   Final    BLOOD RIGHT ARM Performed at [place] [address], [place], [place] [address]    Special Requests   Final    BOTTLES DRAWN AEROBIC ONLY Blood Culture results may not be optimal due to an inadequate volume of blood received in culture bottles Performed at [facility] [address], [place], [place] [address]    Culture   Final    NO GROWTH 2  DAYS Performed at [facility] [address] [place], [place] [address]    Report Status PENDING  Incomplete  SARS Coronavirus 2 [facility] order, Performed in [facility] hospital lab) Nasopharyngeal Nasopharyngeal Swab     Status: None   Collection Time: 07/05/19  3:34 AM   Specimen: Nasopharyngeal Swab  Result Value Ref Range Status   SARS Coronavirus 2 NEGATIVE NEGATIVE Final    Comment: (NOTE) If result is NEGATIVE SARS-CoV-2 target nucleic acids are NOT DETECTED. The SARS-CoV-2 RNA is generally detectable in upper and lower  respiratory specimens during the acute phase of infection. The lowest  concentration of SARS-CoV-2 viral copies this assay can detect is 250  copies / mL. A negative result does not preclude SARS-CoV-2 infection  and should not be used as the sole basis for treatment or other  patient management decisions.  A negative result may occur with  improper specimen collection / handling, submission of specimen other  than nasopharyngeal swab, presence of viral mutation(s) within the  areas targeted by this assay, and inadequate number of viral copies  (<250 copies / mL). A negative result must be combined with clinical  observations, patient history, and epidemiological information. If result is POSITIVE SARS-CoV-2 target nucleic acids are DETECTED. The SARS-CoV-2 RNA is generally detectable in upper and lower  respiratory specimens dur ing the acute phase of infection.  Positive  results are indicative of active infection with SARS-CoV-2.  Clinical  correlation with patient history and other diagnostic information is  necessary to determine patient infection status.  Positive results do  not rule out bacterial infection or co-infection with other viruses. If result is PRESUMPTIVE POSTIVE SARS-CoV-2 nucleic acids MAY BE PRESENT.   A presumptive positive result was obtained on the submitted specimen  and confirmed on repeat testing.  While 2019 novel  coronavirus  (SARS-CoV-2) nucleic acids may be present in the submitted sample  additional confirmatory testing may be necessary for epidemiological  and / or clinical management purposes  to differentiate between  SARS-CoV-2 and other Sarbecovirus currently known to infect humans.  If clinically indicated additional testing with an alternate test  methodology [phone]) is advised. The SARS-CoV-2 RNA is generally  detectable in upper and lower respiratory sp ecimens during the acute  phase of infection. The expected result is Negative. Fact Sheet for Patients:  StrictlyIdeas.no Fact Sheet for Healthcare Providers: BankingDealers.co.za This test is not yet approved or cleared by the [place] FDA and  has been authorized for detection and/or diagnosis of SARS-CoV-2 by FDA under an Emergency Use Authorization (EUA).  This EUA will remain in effect (meaning this test can be used) for the duration of the COVID-19 declaration under Section 564(b)(1) of the Act, 21 U.S.C. section 360bbb-3(b)(1), unless the authorization is terminated or revoked sooner. Performed at [facility], [place] [address] [place], [place] [address]   Blood Culture (routine x 2)     Status: None (Preliminary result)   Collection Time: 07/05/19  7:00 AM   Specimen: BLOOD  Result Value Ref Range Status   Specimen Description   Final    BLOOD LEFT Performed at [facility] [place] [address], [place], [place] [address]    Special Requests   Final    BOTTLES DRAWN AEROBIC AND ANAEROBIC Blood Culture adequate volume Performed at [facility] [address] [place], [place] [address]    Culture   Final    NO GROWTH 2 DAYS Performed at [facility] [address] [place], [place] [address]    Report Status PENDING  Incomplete  Gram stain     Status: None   Collection Time: 07/05/19  4:15 PM   Specimen:  Fluid  Result Value Ref Range Status   Specimen Description FLUID  Final   Special Requests NONE  Final   Gram Stain   Final    FEW WBC PRESENT, PREDOMINANTLY MONONUCLEAR NO ORGANISMS SEEN Performed at [facility], [place] [address] [place], [place] [address]    Report Status 07/06/2019 FINAL  Final  Culture, body fluid-bottle     Status: None (Preliminary result)   Collection Time: 07/05/19  4:15 PM   Specimen: Fluid  Result Value Ref Range Status   Specimen Description FLUID  Final   Special Requests NONE  Final   Culture   Final    NO GROWTH 2 DAYS Performed at [facility] [place] [address], [place], [place] [address]    Report Status PENDING  Incomplete  MRSA PCR Screening     Status: None   Collection Time: 07/06/19  3:30 AM   Specimen: Nasal Mucosa; Nasopharyngeal  Result Value Ref Range Status   MRSA by PCR NEGATIVE NEGATIVE Final    Comment:        The GeneXpert MRSA Assay (FDA approved for NASAL specimens only), is one component of a comprehensive MRSA colonization surveillance program. It is not intended to diagnose MRSA infection nor to guide or monitor treatment for MRSA infections. Performed at [facility], [place] [address] [place],  [address]          Radiology Studies: Dg Chest Port 1 View  Result Date: 07/05/2019 CLINICAL DATA:  Status post left-sided thoracentesis EXAM: PORTABLE CHEST 1 VIEW COMPARISON:  July 05, 2019 FINDINGS: The left-sided pleural effusion has significantly decreased in size from the prior study. There is no evidence for left-sided pneumothorax. Bibasilar airspace opacities are noted, left worse than right. The lung volumes are low. The heart size is stable from prior study. IMPRESSION: 1. No pneumothorax status post left-sided thoracentesis. 2. Significant interval decrease in size of the left-sided pleural effusion. 3. Bibasilar airspace opacities, left greater than right, favored to represent  atelectasis. 4. Low lung volumes. Electronically Signed   By: Constance Holster M.D.   On: 07/05/2019 16:31   US Thoracentesis Asp Pleural Space W/img Guide  Result Date: 07/05/2019 INDICATION: Patient with history  of recurrent pancreatitis, GERD, dyspnea, recurrent left pleural effusion. Request made for diagnostic and therapeutic left thoracentesis. EXAM: ULTRASOUND GUIDED DIAGNOSTIC AND THERAPEUTIC LEFT THORACENTESIS MEDICATIONS: None COMPLICATIONS: None immediate. PROCEDURE: An ultrasound guided thoracentesis was thoroughly discussed with the patient and questions answered. The benefits, risks, alternatives and complications were also discussed. The patient understands and wishes to proceed with the procedure. Written consent was obtained. Ultrasound was performed to localize and mark an adequate pocket of fluid in the left chest. The area was then prepped and draped in the normal sterile fashion. 1% Lidocaine was used for local anesthesia. Under ultrasound guidance a 6 Fr Safe-T-Centesis catheter was introduced. Thoracentesis was performed. The catheter was removed and a dressing applied. FINDINGS: A total of approximately 770 cc of yellow fluid was removed. Samples were sent to the laboratory as requested by the clinical team. IMPRESSION: Successful ultrasound guided diagnostic and therapeutic left thoracentesis yielding 770 cc of pleural fluid. Read by: Rowe Robert, PA-C Electronically Signed   By: Markus Daft M.D.   On: 07/05/2019 16:12        Scheduled Meds:  Chlorhexidine Gluconate Cloth  6 each Topical Q0600   enoxaparin (LOVENOX) injection  40 mg Subcutaneous Q24H   feeding supplement  1 Container Oral TID BM   feeding supplement (PRO-STAT SUGAR FREE 64)  30 mL Oral BID   guaiFENesin  600 mg Oral BID   lip balm  1 application Topical BID   metoprolol tartrate  25 mg Oral BID   morphine   Intravenous Q4H   multivitamin with minerals  1 tablet Oral Daily   Continuous  Infusions:  sodium chloride 10 mL/hr at 07/07/19 1545   famotidine (PEPCID) IV Stopped (07/07/19 1016)   meropenem (MERREM) IV Stopped (07/07/19 1427)     LOS: 2 days    Time spent: 36 minutes.     Hosie Poisson, MD [facility] Pager [phone]   If 7PM-7AM, please contact night-coverage [email or web address] Password [facility] 07/07/2019, 3:53 PM

## 2019-07-07 NOTE — Progress Notes (Signed)
Subjective: No acute events.  Objective: Vital signs in last 24 hours: Temp:  [97.6 F (36.4 C)-98.7 F (37.1 C)] 98.7 F (37.1 C) (08/18 1320) Pulse Rate:  [97-118] 100 (08/18 1320) Resp:  [15-39] 20 (08/18 1320) BP: (106-155)/(69-98) 122/73 (08/18 1320) SpO2:  [94 %-100 %] 95 % (08/18 1320) FiO2 (%):  [33 %] 33 % (08/18 0932) Weight:  [78.1 kg] 78.1 kg (08/18 0500) Last BM Date: 07/04/19(pt reported)  Intake/Output from previous day: 08/17 0701 - 08/18 0700 In: 544.4 [I.V.:153; IV Piggyback:391.5] Out: 150 [Urine:150] Intake/Output this shift: Total I/O In: 110.7 [I.V.:61.5; IV Piggyback:49.2] Out: 225 [Urine:225]  General appearance: alert and no distress GI: mild tenderness in the upper abdomen  Lab Results: Recent Labs    07/05/19 0420 07/06/19 0212 07/07/19 0221  WBC 28.4* 25.4* 20.0*  HGB 13.3 11.7* 10.4*  HCT 42.5 38.5* 34.2*  PLT 1,238* 905* 702*   BMET Recent Labs    07/05/19 0420 07/06/19 0212 07/07/19 0221  NA 131* 133* 132*  K 3.8 4.4 4.1  CL 91* 98 96*  CO2 27 24 27   GLUCOSE 165* 122* 108*  BUN 9 14 12   CREATININE 0.88 0.77 0.63  CALCIUM 9.1 8.7* 8.6*   LFT Recent Labs    07/07/19 0221  PROT 6.1*  ALBUMIN 1.8*  AST 14*  ALT 16  ALKPHOS 107  BILITOT 0.7   PT/INR Recent Labs    07/05/19 0420  LABPROT 15.3*  INR 1.2   Hepatitis Panel No results for input(s): HEPBSAG, HCVAB, HEPAIGM, HEPBIGM in the last 72 hours. C-Diff No results for input(s): CDIFFTOX in the last 72 hours. Fecal Lactopherrin No results for input(s): FECLLACTOFRN in the last 72 hours.  Studies/Results: Dg Chest Port 1 View  Result Date: 07/05/2019 CLINICAL DATA:  Status post left-sided thoracentesis EXAM: PORTABLE CHEST 1 VIEW COMPARISON:  July 05, 2019 FINDINGS: The left-sided pleural effusion has significantly decreased in size from the prior study. There is no evidence for left-sided pneumothorax. Bibasilar airspace opacities are noted, left worse than  right. The lung volumes are low. The heart size is stable from prior study. IMPRESSION: 1. No pneumothorax status post left-sided thoracentesis. 2. Significant interval decrease in size of the left-sided pleural effusion. 3. Bibasilar airspace opacities, left greater than right, favored to represent atelectasis. 4. Low lung volumes. Electronically Signed   By: Constance Holster M.D.   On: 07/05/2019 16:31   US Thoracentesis Asp Pleural Space W/img Guide  Result Date: 07/05/2019 INDICATION: Patient with history of recurrent pancreatitis, GERD, dyspnea, recurrent left pleural effusion. Request made for diagnostic and therapeutic left thoracentesis. EXAM: ULTRASOUND GUIDED DIAGNOSTIC AND THERAPEUTIC LEFT THORACENTESIS MEDICATIONS: None COMPLICATIONS: None immediate. PROCEDURE: An ultrasound guided thoracentesis was thoroughly discussed with the patient and questions answered. The benefits, risks, alternatives and complications were also discussed. The patient understands and wishes to proceed with the procedure. Written consent was obtained. Ultrasound was performed to localize and mark an adequate pocket of fluid in the left chest. The area was then prepped and draped in the normal sterile fashion. 1% Lidocaine was used for local anesthesia. Under ultrasound guidance a 6 Fr Safe-T-Centesis catheter was introduced. Thoracentesis was performed. The catheter was removed and a dressing applied. FINDINGS: A total of approximately 770 cc of yellow fluid was removed. Samples were sent to the laboratory as requested by the clinical team. IMPRESSION: Successful ultrasound guided diagnostic and therapeutic left thoracentesis yielding 770 cc of pleural fluid. Read by: Rowe Robert, PA-C Electronically Signed  By: Markus Daft M.D.   On: 07/05/2019 16:12    Medications:  Scheduled: . Chlorhexidine Gluconate Cloth  6 each Topical Q0600  . enoxaparin (LOVENOX) injection  40 mg Subcutaneous Q24H  . feeding supplement  1  Container Oral TID BM  . feeding supplement (PRO-STAT SUGAR FREE 64)  30 mL Oral BID  . guaiFENesin  600 mg Oral BID  . lip balm  1 application Topical BID  . metoprolol tartrate  25 mg Oral BID  . morphine   Intravenous Q4H  . multivitamin with minerals  1 tablet Oral Daily   Continuous: . sodium chloride 10 mL/hr at 07/07/19 1240  . famotidine (PEPCID) IV Stopped (07/07/19 1016)  . meropenem (MERREM) IV 1 g (07/07/19 1357)    Assessment/Plan: 1) Idiopathic pancreatitis. 2) Pancreatic pseudocysts. 3) Leukocytosis.   Clinically he is stable.  The PCA is very beneficial for him.  His WBC has improved and he continues with meropenem.  Plan: 1) Continue with pain control. 2) Advance diet when patient is ready, but he may require a feeding tube if he is not able to maintain proper nutrition. 3) Continue with supportive care.    LOS: 2 days   Awa Bachicha D 07/07/2019, 1:46 PM

## 2019-07-08 DIAGNOSIS — K863 Pseudocyst of pancreas: Secondary | ICD-10-CM

## 2019-07-08 LAB — COMPREHENSIVE METABOLIC PANEL
ALT: 20 U/L (ref 0–44)
AST: 25 U/L (ref 15–41)
Albumin: 1.6 g/dL — ABNORMAL LOW (ref 3.5–5.0)
Alkaline Phosphatase: 139 U/L — ABNORMAL HIGH (ref 38–126)
Anion gap: 9 (ref 5–15)
BUN: 12 mg/dL (ref 6–20)
CO2: 27 mmol/L (ref 22–32)
Calcium: 8.3 mg/dL — ABNORMAL LOW (ref 8.9–10.3)
Chloride: 94 mmol/L — ABNORMAL LOW (ref 98–111)
Creatinine, Ser: 0.51 mg/dL — ABNORMAL LOW (ref 0.61–1.24)
GFR calc Af Amer: >60 mL/min (ref >60)
GFR calc non Af Amer: >60 mL/min (ref >60)
Glucose, Bld: 106 mg/dL — ABNORMAL HIGH (ref 70–99)
Potassium: 4 mmol/L (ref 3.5–5.1)
Sodium: 130 mmol/L — ABNORMAL LOW (ref 135–145)
Total Bilirubin: 0.8 mg/dL (ref 0.3–1.2)
Total Protein: 5.8 g/dL — ABNORMAL LOW (ref 6.5–8.1)

## 2019-07-08 LAB — CBC
HCT: 33.3 % — ABNORMAL LOW (ref 39.0–52.0)
Hemoglobin: 10.3 g/dL — ABNORMAL LOW (ref 13.0–17.0)
MCH: 26.8 pg (ref 26.0–34.0)
MCHC: 30.9 g/dL (ref 30.0–36.0)
MCV: 86.7 fL (ref 80.0–100.0)
Platelets: 662 10*3/uL — ABNORMAL HIGH (ref 150–400)
RBC: 3.84 MIL/uL — ABNORMAL LOW (ref 4.22–5.81)
RDW: 13.8 % (ref 11.5–15.5)
WBC: 21.9 10*3/uL — ABNORMAL HIGH (ref 4.0–10.5)
nRBC: 0 % (ref 0.0–0.2)

## 2019-07-08 LAB — MAGNESIUM: Magnesium: 1.8 mg/dL (ref 1.7–2.4)

## 2019-07-08 LAB — PHOSPHORUS: Phosphorus: 3.1 mg/dL (ref 2.5–4.6)

## 2019-07-08 LAB — LIPASE, BLOOD: Lipase: 122 U/L — ABNORMAL HIGH (ref 11–51)

## 2019-07-08 MED ORDER — HYDROMORPHONE HCL 1 MG/ML IJ SOLN: 2.0000 mg | INTRAMUSCULAR | Status: AC | PRN

## 2019-07-08 MED ORDER — HYDROMORPHONE HCL 1 MG/ML IJ SOLN
2.0000 mg | Freq: Once | INTRAMUSCULAR | Status: DC
  Administered 2019-07-08: 2 mg via INTRAMUSCULAR
  Filled 2019-07-08: qty 2

## 2019-07-08 NOTE — Progress Notes (Addendum)
PROGRESS NOTE                                                                                                                                                                                                             Patient Demographics:    Lindburgh Bluemel, is a 48 y.o. male, DOB - [birth year], ZDG:[id number]  Admit date - 07/05/2019   Admitting Physician A Grier Mitts., MD  Outpatient Primary MD for the patient is Patient, No Pcp Per  LOS - 3  [facility]  Chief Complaint  Patient presents with   Chest Pain   Abdominal Pain       Brief Narrative    48 year old male with recurrent pancreatitis with developing pseudocyst, IBS and GERD who is followed at [facility] for his idiopathic pancreatitis and pancreatic cyst presented with recurrent epigastric pain suspicious for acute on chronic pancreatitis.  He was last hospitalized at [facility] on 06/02/2019 with necrotizing pancreatitis.  CT angiogram of the chest was negative for PE but showed large layering left pleural effusion with near complete lower lobe collapse.  IR consulted and underwent thoracentesis of the left pleural effusion.  Surgery consulted for evaluation of pseudocyst and recommended to wait for 6 weeks for the pseudocyst to mature and either follow-up with Dr. gross or at [facility] for further evaluation. Given his persistent pain he has been requiring IV morphine PCA.   Subjective:   Patient reports that his pain is controlled on morphine PCA.  Had 1 bowel movement last night.  Afebrile.   Assessment  & Plan :    Principal Problem:   Idiopathic acute pancreatitis with uninfected necrosis Being managed with IV fluids, morphine PCA and empiric IV antibiotics GI and surgery following closely.  Surgery recommends ideally the pseudocyst will need to measure for at least 6 weeks to attempt cystoscopy gastrostomy.  Will  need adequate nutrition either p.o. if tolerated or through feeding tube.  He tried some clears and became nauseous. Discussed core track with feeding options with patient in detail.  He says he would like to try p.o. On reviewing his admission at [facility] 1 month back NG feeding was discussed but patient refused.  I try to persuade him on getting a core track and starting feeding.  Patient refusing and says he wants to  think about it tonight and decide by tomorrow morning. Called [facility] for transfer since patient follows with both GI and surgery there.  Was told by transfer service that they do not have bed and to call back tomorrow if still needed to be transferred (have called both yesterday and today).  Active Problems: Left-sided large pleural effusion Thoracentesis on 8/16 appears exudative.?  Parapneumonic.  Cultures negative.  Stable on 3 L via nasal cannula.  Continue empiric IV meropenem.  Severe protein calorie malnutrition (HCC) N.p.o. with abdominal pain.  Will discuss with patient in place a core track for tube feeding.  Tobacco abuse Counseled on cessation  Alcohol abuse No signs of withdrawal.  Monitor on CIWA.  Counseled on cessation.  Mild hyponatremia Stable.  Monitor  Leukocytosis and thrombocytosis Likely reactive with acute pancreatitis Monitor on antibiotics.    Code Status : Full code  Family Communication  : None  Disposition Plan  : Possible transfer to [facility] versus home once improved  Barriers For Discharge : Active symptoms  Consults  : GI/surgery  Procedures  : CT abdomen pelvis, left thoracentesis  DVT Prophylaxis  :  Lovenox -   Lab Results  Component Value Date   PLT 662 (H) 07/08/2019    Antibiotics  :    Anti-infectives (From admission, onward)   Start     Dose/Rate Route Frequency Ordered Stop   07/05/19 1800  ceFEPIme (MAXIPIME) 2 g in sodium chloride 0.9 % 100 mL IVPB  Status:  Discontinued     2 g 200 mL/hr over 30  Minutes Intravenous Every 8 hours 07/05/19 0852 07/05/19 1013   07/05/19 1400  meropenem (MERREM) 1 g in sodium chloride 0.9 % 100 mL IVPB     1 g 200 mL/hr over 30 Minutes Intravenous Every 8 hours 07/05/19 1013 07/15/19 1359   07/05/19 1000  metroNIDAZOLE (FLAGYL) IVPB 500 mg  Status:  Discontinued     500 mg 100 mL/hr over 60 Minutes Intravenous Every 8 hours 07/05/19 0833 07/05/19 1013   07/05/19 0345  vancomycin (VANCOCIN) IVPB 1000 mg/200 mL premix  Status:  Discontinued     1,000 mg 200 mL/hr over 60 Minutes Intravenous  Once 07/05/19 0333 07/05/19 0336   07/05/19 0345  ceFEPIme (MAXIPIME) 2 g in sodium chloride 0.9 % 100 mL IVPB     2 g 200 mL/hr over 30 Minutes Intravenous  Once 07/05/19 0333 07/05/19 1001   07/05/19 0345  vancomycin (VANCOCIN) 1,500 mg in sodium chloride 0.9 % 500 mL IVPB     1,500 mg 250 mL/hr over 120 Minutes Intravenous  Once 07/05/19 0336 07/05/19 1001        Objective:   Vitals:   07/08/19 0832 07/08/19 1111 07/08/19 1236 07/08/19 1239  BP:  123/80 132/76   Pulse:  97 96   Resp: 20 17 17 17   Temp:  98.3 F (36.8 C) 98.5 F (36.9 C)   TempSrc:  Oral Oral   SpO2: 95% 94% 98% 97%  Weight:      Height:        Wt Readings from Last 3 Encounters:  07/08/19 81.9 kg  06/21/19 75 kg     Intake/Output Summary (Last 24 hours) at 07/08/2019 1529 Last data filed at 07/08/2019 1500 Gross per 24 hour  Intake 913.53 ml  Output 300 ml  Net 613.53 ml     Physical Exam  Gen: not in distress, fatigued HEENT: Pallor + moist mucosa, supple neck Chest:  Clear bilaterally CVS: S1-S2 tachycardic, no murmurs GI: soft, nondistended, epigastric tenderness +, bowel sounds present Musculoskeletal: warm, no edema     Data Review:    CBC Recent Labs  Lab 07/05/19 0420 07/06/19 0212 07/07/19 0221 07/08/19 0403  WBC 28.4* 25.4* 20.0* 21.9*  HGB 13.3 11.7* 10.4* 10.3*  HCT 42.5 38.5* 34.2* 33.3*  PLT 1,238* 905* 702* 662*  MCV 87.8 88.9 87.7 86.7    MCH 27.5 27.0 26.7 26.8  MCHC 31.3 30.4 30.4 30.9  RDW 13.4 13.2 13.7 13.8    Chemistries  Recent Labs  Lab 07/05/19 0420 07/06/19 0212 07/07/19 0221 07/08/19 0403  NA 131* 133* 132* 130*  K 3.8 4.4 4.1 4.0  CL 91* 98 96* 94*  CO2 27 24 27 27   GLUCOSE 165* 122* 108* 106*  BUN 9 14 12 12   CREATININE 0.88 0.77 0.63 0.51*  CALCIUM 9.1 8.7* 8.6* 8.3*  MG  --   --   --  1.8  AST 24 14* 14* 25  ALT 33 20 16 20   ALKPHOS 146* 102 107 139*  BILITOT 0.4 0.5 0.7 0.8   ------------------------------------------------------------------------------------------------------------------ No results for input(s): CHOL, HDL, LDLCALC, TRIG, CHOLHDL, LDLDIRECT in the last 72 hours.  No results found for: HGBA1C ------------------------------------------------------------------------------------------------------------------ No results for input(s): TSH, T4TOTAL, T3FREE, THYROIDAB in the last 72 hours.  Invalid input(s): FREET3 ------------------------------------------------------------------------------------------------------------------ No results for input(s): VITAMINB12, FOLATE, FERRITIN, TIBC, IRON, RETICCTPCT in the last 72 hours.  Coagulation profile Recent Labs  Lab 07/05/19 0420  INR 1.2    No results for input(s): DDIMER in the last 72 hours.  Cardiac Enzymes No results for input(s): CKMB, TROPONINI, MYOGLOBIN in the last 168 hours.  Invalid input(s): CK ------------------------------------------------------------------------------------------------------------------    Component Value Date/Time   BNP 98.7 07/05/2019 0420    Inpatient Medications  Scheduled Meds:  Chlorhexidine Gluconate Cloth  6 each Topical Q0600   enoxaparin (LOVENOX) injection  40 mg Subcutaneous Q24H   feeding supplement  1 Container Oral TID BM   feeding supplement (PRO-STAT SUGAR FREE 64)  30 mL Oral BID   folic acid  1 mg Oral Daily   guaiFENesin  600 mg Oral BID   lip balm  1  application Topical BID   metoprolol tartrate  25 mg Oral BID   morphine   Intravenous Q4H   multivitamin with minerals  1 tablet Oral Daily   thiamine  100 mg Oral Daily   Or   thiamine  100 mg Intravenous Daily   Continuous Infusions:  sodium chloride 250 mL (07/07/19 2035)   famotidine (PEPCID) IV 20 mg (07/08/19 0843)   meropenem (MERREM) IV 1 g (07/08/19 1422)   PRN Meds:.sodium chloride, acetaminophen **OR** acetaminophen, diphenhydrAMINE **OR** diphenhydrAMINE, LORazepam **OR** LORazepam, magic mouthwash, menthol-cetylpyridinium, naloxone **AND** sodium chloride flush, ondansetron (ZOFRAN) IV, phenol, polyethylene glycol  Micro Results Recent Results (from the past 240 hour(s))  Urine culture     Status: None   Collection Time: 07/05/19  2:46 AM   Specimen: In/Out Cath Urine  Result Value Ref Range Status   Specimen Description   Final    IN/OUT CATH URINE Performed at [facility], [address], [place], [place] [address]    Special Requests   Final    NONE Performed at [facility], [address] [place], [place] [address]    Culture   Final    NO GROWTH Performed at [facility], [address], [place],  [place] [address]    Report Status 07/06/2019 FINAL  Final  Blood Culture (routine x 2)     Status: None (Preliminary result)   Collection Time: 07/05/19  3:33 AM   Specimen: BLOOD  Result Value Ref Range Status   Specimen Description   Final    BLOOD RIGHT ARM Performed at [facility], [address] [place], [place] [address]    Special Requests   Final    BOTTLES DRAWN AEROBIC ONLY Blood Culture results may not be optimal due to an inadequate volume of blood received in culture bottles Performed at [facility], [address], [place], [place] [address]    Culture   Final    NO GROWTH 3 DAYS Performed at [facility], [address], [place], [place]  [address]    Report Status PENDING  Incomplete  SARS Coronavirus 2 [facility] order, Performed in [facility] hospital lab) Nasopharyngeal Nasopharyngeal Swab     Status: None   Collection Time: 07/05/19  3:34 AM   Specimen: Nasopharyngeal Swab  Result Value Ref Range Status   SARS Coronavirus 2 NEGATIVE NEGATIVE Final    Comment: (NOTE) If result is NEGATIVE SARS-CoV-2 target nucleic acids are NOT DETECTED. The SARS-CoV-2 RNA is generally detectable in upper and lower  respiratory specimens during the acute phase of infection. The lowest  concentration of SARS-CoV-2 viral copies this assay can detect is 250  copies / mL. A negative result does not preclude SARS-CoV-2 infection  and should not be used as the sole basis for treatment or other  patient management decisions.  A negative result may occur with  improper specimen collection / handling, submission of specimen other  than nasopharyngeal swab, presence of viral mutation(s) within the  areas targeted by this assay, and inadequate number of viral copies  (<250 copies / mL). A negative result must be combined with clinical  observations, patient history, and epidemiological information. If result is POSITIVE SARS-CoV-2 target nucleic acids are DETECTED. The SARS-CoV-2 RNA is generally detectable in upper and lower  respiratory specimens dur ing the acute phase of infection.  Positive  results are indicative of active infection with SARS-CoV-2.  Clinical  correlation with patient history and other diagnostic information is  necessary to determine patient infection status.  Positive results do  not rule out bacterial infection or co-infection with other viruses. If result is PRESUMPTIVE POSTIVE SARS-CoV-2 nucleic acids MAY BE PRESENT.   A presumptive positive result was obtained on the submitted specimen  and confirmed on repeat testing.  While 2019 novel coronavirus  (SARS-CoV-2) nucleic acids may be present in the submitted sample    additional confirmatory testing may be necessary for epidemiological  and / or clinical management purposes  to differentiate between  SARS-CoV-2 and other Sarbecovirus currently known to infect humans.  If clinically indicated additional testing with an alternate test  methodology [phone]) is advised. The SARS-CoV-2 RNA is generally  detectable in upper and lower respiratory sp ecimens during the acute  phase of infection. The expected result is Negative. Fact Sheet for Patients:  BoilerBrush.com.cy Fact Sheet for Healthcare Providers: https://pope.com/ This test is not yet approved or cleared by the [place] FDA and has been authorized for detection and/or diagnosis of SARS-CoV-2 by FDA under an Emergency Use Authorization (EUA).  This EUA will remain in effect (meaning this test can be used) for the duration of the COVID-19 declaration under Section 564(b)(1) of the Act, 21  U.S.C. section 360bbb-3(b)(1), unless the authorization is terminated or revoked sooner. Performed at [facility], [address], [place], [place] [address]   Blood Culture (routine x 2)     Status: None (Preliminary result)   Collection Time: 07/05/19  7:00 AM   Specimen: BLOOD  Result Value Ref Range Status   Specimen Description   Final    BLOOD LEFT Performed at [facility], [address] [place], [place] [address]    Special Requests   Final    BOTTLES DRAWN AEROBIC AND ANAEROBIC Blood Culture adequate volume Performed at [facility], [address] [place], [place] [address]    Culture   Final    NO GROWTH 3 DAYS Performed at [facility], [address] [place], [place] [address]    Report Status PENDING  Incomplete  Gram stain     Status: None   Collection Time: 07/05/19  4:15 PM   Specimen: Fluid  Result Value Ref Range Status   Specimen Description FLUID  Final    Special Requests NONE  Final   Gram Stain   Final    FEW WBC PRESENT, PREDOMINANTLY MONONUCLEAR NO ORGANISMS SEEN Performed at [facility], [address], [place], [place] [address]    Report Status 07/06/2019 FINAL  Final  Culture, body fluid-bottle     Status: None (Preliminary result)   Collection Time: 07/05/19  4:15 PM   Specimen: Fluid  Result Value Ref Range Status   Specimen Description FLUID  Final   Special Requests NONE  Final   Culture   Final    NO GROWTH 3 DAYS Performed at [facility], [address], [place], [place] [address]    Report Status PENDING  Incomplete  MRSA PCR Screening     Status: None   Collection Time: 07/06/19  3:30 AM   Specimen: Nasal Mucosa; Nasopharyngeal  Result Value Ref Range Status   MRSA by PCR NEGATIVE NEGATIVE Final    Comment:        The GeneXpert MRSA Assay (FDA approved for NASAL specimens only), is one component of a comprehensive MRSA colonization surveillance program. It is not intended to diagnose MRSA infection nor to guide or monitor treatment for MRSA infections. Performed at [facility], [address] [place], [place] [address]     Radiology Reports Dg Chest 1 View  Result Date: 06/26/2019 CLINICAL DATA:  Status post thoracentesis. EXAM: CHEST  1 VIEW COMPARISON:  06/24/2019 FINDINGS: Interval near complete evacuation of the left pleural fluid collection. A small residual effusion remains. No evidence of a postprocedural pneumothorax. Left basilar atelectasis is noted. The right lung is clear. No right-sided effusion. IMPRESSION: Interval near complete evacuation of the left pleural fluid collection with left lower lobe atelectasis. No postprocedural pneumothorax. Electronically Signed   By: Rudie Meyer M.D.   On: 06/26/2019 09:40   Dg Abd 1 View  Result Date: 06/21/2019 CLINICAL DATA:  Pt reports extreme abdominal pain across entire abdomen. Reports hx of pancreatitis. Pain with  any movements. EXAM: ABDOMEN - 1 VIEW COMPARISON:  None. FINDINGS: The bowel gas pattern is normal. Cholecystectomy clips. No radio-opaque calculi or other significant radiographic abnormality are seen. IMPRESSION: Negative. Electronically Signed   By: Corlis Leak M.D.   On: 06/21/2019 11:04   Ct Chest W Contrast  Result Date: 06/25/2019 CLINICAL DATA:  Shortness of breath, acute on chronic pancreatitis with multiple  peripancreatic fluid collections, recurrent epigastric pain EXAM: CT CHEST, ABDOMEN, AND PELVIS WITH CONTRAST TECHNIQUE: Multidetector CT imaging of the chest, abdomen and pelvis was performed following the standard protocol during bolus administration of intravenous contrast. 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 Normal heart size. No pericardial effusion. Mediastinum/Nodes: No enlarged mediastinal, hilar, or axillary lymph nodes. Thyroid gland, trachea, and esophagus demonstrate no significant findings. Lungs/Pleura: There is a large left pleural effusion with associated atelectasis or consolidation, enlarged compared to prior examination. There is a new, trace right pleural effusion. There are scattered subpleural ground-glass pulmonary opacities, most conspicuous in the right upper lobe. Musculoskeletal: No chest wall mass or suspicious bone lesions identified. CT ABDOMEN PELVIS FINDINGS Hepatobiliary: No focal liver abnormality is seen. Status post cholecystectomy. No biliary dilatation. Pancreas: Redemonstrated extensive retroperitoneal inflammation and multiple peripancreatic fluid collections. There is a new discrete fluid collection or component adjacent to the greater curvature of the stomach and spleen measuring approximately 6.2 x 2.7 cm (series 3, image 52). There is an additional new fluid  collection within or adjacent to the omentum measuring 4.7 x 2.5 cm (series 3, image 75). There is an additional new fluid collection within the small bowel mesentery measuring 5.0 x 3.2 cm (series 3, image 86). Other fluid collections are not significantly changed, for example anterior to the pancreatic neck (series 3, image 67), adjacent to the porta hepatis (series 3, image 67), adjacent to the left kidney and splenic flexure (series 3, image 73), and posterior to the left kidney (series 3, image 65). Spleen: Splenomegaly, maximum span 14.4 cm. Adrenals/Urinary Tract: Adrenal glands are unremarkable. Kidneys are normal, without renal calculi, solid lesion, or hydronephrosis. Bladder is unremarkable. Stomach/Bowel: Stomach is within normal limits. Appendix appears normal. Inflammatory thickening of the transverse colon (series 3, image 81), similar to prior examination, and of the mid small bowel, particularly a segment in the anterior abdomen (series 3, image 95). Vascular/Lymphatic: Aortic atherosclerosis. No enlarged abdominal or pelvic lymph nodes. Reproductive: No mass or other abnormality. Other: Extensive anasarca. Small volume ascites, similar to prior examination. Musculoskeletal: No acute or significant osseous findings. IMPRESSION: 1. There is a large left pleural effusion with associated atelectasis or consolidation, enlarged compared to prior examination. There is a new, trace right pleural effusion. 2. There are scattered subpleural ground-glass pulmonary opacities, most conspicuous in the right upper lobe. These are nonspecific and infectious or inflammatory. 3. Redemonstrated extensive retroperitoneal inflammation and multiple peripancreatic fluid collections. There is a new discrete fluid collection or component adjacent to the greater curvature of the stomach and spleen measuring approximately 6.2 x 2.7 cm (series 3, image 52). There is an additional new fluid collection within or adjacent to the  omentum measuring 4.7 x 2.5 cm (series 3, image 75). There is an additional new fluid collection within the small bowel mesentery measuring 5.0 x 3.2 cm (series 3, image 86). 4. Other fluid collections are not significantly changed, for example anterior to the pancreatic neck (series 3, image 67), adjacent to the porta hepatis (series 3, image 67), adjacent to the left kidney and splenic flexure (series 3, image 73), and posterior to the left kidney (series 3, image 65). 5. Inflammatory thickening of the transverse colon (series 3, image 81), similar to prior examination, and of the mid small bowel, particularly a segment in the anterior abdomen (series 3,  image 95). 6. Extensive anasarca. Small volume ascites, similar to prior examination. Electronically Signed   By: Lauralyn Primes M.D.   On: 06/25/2019 14:18   Ct Angio Chest Pe W/cm &/or Wo Cm  Result Date: 07/05/2019 CLINICAL DATA:  Chest pain.  Complicated pancreatitis. EXAM: CT ANGIOGRAPHY CHEST WITH CONTRAST TECHNIQUE: Multidetector CT imaging of the chest was performed using the standard protocol during bolus administration of intravenous contrast. Multiplanar CT image reconstructions and MIPs were obtained to evaluate the vascular anatomy. CONTRAST:  OMNIPAQUE IOHEXOL 350 MG/ML SOLN COMPARISON:  Chest CT from 10 days ago FINDINGS: Cardiovascular: Satisfactory opacification of the pulmonary arteries to the segmental level. No evidence of pulmonary embolism when accounting for intermittent motion artifact. Normal heart size. No pericardial effusion. Aberrant right subclavian artery Mediastinum/Nodes: Negative for adenopathy or mass. Lungs/Pleura: Large left pleural effusion that is layering. Trace right pleural fluid. Near complete collapse of the left lower lobe. No consolidation or edema. Upper Abdomen: As reported separately. Musculoskeletal: No acute or aggressive finding Review of the MIP images confirms the above findings. IMPRESSION: 1. Negative  for pulmonary embolism. 2. Large layering left pleural effusion with near complete lower lobe collapse. Electronically Signed   By: Marnee Spring M.D.   On: 07/05/2019 07:16   Ct Abdomen Pelvis W Contrast  Result Date: 06/25/2019 CLINICAL DATA:  Shortness of breath, acute on chronic pancreatitis with multiple peripancreatic fluid collections, recurrent epigastric pain EXAM: CT CHEST, ABDOMEN, AND PELVIS WITH CONTRAST TECHNIQUE: Multidetector CT imaging of the chest, abdomen and pelvis was performed following the standard protocol during bolus administration of intravenous contrast. 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 Normal heart size. No pericardial effusion. Mediastinum/Nodes: No enlarged mediastinal, hilar, or axillary lymph nodes. Thyroid gland, trachea, and esophagus demonstrate no significant findings. Lungs/Pleura: There is a large left pleural effusion with associated atelectasis or consolidation, enlarged compared to prior examination. There is a new, trace right pleural effusion. There are scattered subpleural ground-glass pulmonary opacities, most conspicuous in the right upper lobe. Musculoskeletal: No chest wall mass or suspicious bone lesions identified. CT ABDOMEN PELVIS FINDINGS Hepatobiliary: No focal liver abnormality is seen. Status post cholecystectomy. No biliary dilatation. Pancreas: Redemonstrated extensive retroperitoneal inflammation and multiple peripancreatic fluid collections. There is a new discrete fluid collection or component adjacent to the greater curvature of the stomach and spleen measuring approximately 6.2 x 2.7 cm (series 3, image 52). There is an additional new fluid collection within or adjacent to the omentum measuring 4.7 x 2.5 cm (series 3, image 75). There is an  additional new fluid collection within the small bowel mesentery measuring 5.0 x 3.2 cm (series 3, image 86). Other fluid collections are not significantly changed, for example anterior to the pancreatic neck (series 3, image 67), adjacent to the porta hepatis (series 3, image 67), adjacent to the left kidney and splenic flexure (series 3, image 73), and posterior to the left kidney (series 3, image 65). Spleen: Splenomegaly, maximum span 14.4 cm. Adrenals/Urinary Tract: Adrenal glands are unremarkable. Kidneys are normal, without renal calculi, solid lesion, or hydronephrosis. Bladder is unremarkable. Stomach/Bowel: Stomach is within normal limits. Appendix appears normal. Inflammatory thickening of the transverse colon (series 3, image 81), similar to prior examination, and of the mid small bowel, particularly a segment in the anterior abdomen (series 3, image 95). Vascular/Lymphatic: Aortic atherosclerosis. No  enlarged abdominal or pelvic lymph nodes. Reproductive: No mass or other abnormality. Other: Extensive anasarca. Small volume ascites, similar to prior examination. Musculoskeletal: No acute or significant osseous findings. IMPRESSION: 1. There is a large left pleural effusion with associated atelectasis or consolidation, enlarged compared to prior examination. There is a new, trace right pleural effusion. 2. There are scattered subpleural ground-glass pulmonary opacities, most conspicuous in the right upper lobe. These are nonspecific and infectious or inflammatory. 3. Redemonstrated extensive retroperitoneal inflammation and multiple peripancreatic fluid collections. There is a new discrete fluid collection or component adjacent to the greater curvature of the stomach and spleen measuring approximately 6.2 x 2.7 cm (series 3, image 52). There is an additional new fluid collection within or adjacent to the omentum measuring 4.7 x 2.5 cm (series 3, image 75). There is an additional new fluid collection  within the small bowel mesentery measuring 5.0 x 3.2 cm (series 3, image 86). 4. Other fluid collections are not significantly changed, for example anterior to the pancreatic neck (series 3, image 67), adjacent to the porta hepatis (series 3, image 67), adjacent to the left kidney and splenic flexure (series 3, image 73), and posterior to the left kidney (series 3, image 65). 5. Inflammatory thickening of the transverse colon (series 3, image 81), similar to prior examination, and of the mid small bowel, particularly a segment in the anterior abdomen (series 3, image 95). 6. Extensive anasarca. Small volume ascites, similar to prior examination. Electronically Signed   By: Lauralyn Primes M.D.   On: 06/25/2019 14:18   Ct Abdomen Pelvis W Contrast  Result Date: 06/20/2019 CLINICAL DATA:  Abdominal pain.  Neutropenia. EXAM: CT ABDOMEN AND PELVIS WITH CONTRAST TECHNIQUE: Multidetector CT imaging of the abdomen and pelvis was performed using the standard protocol following bolus administration of intravenous contrast. CONTRAST:  OMNIPAQUE IOHEXOL 300 MG/ML  SOLN COMPARISON:  None. FINDINGS: Lower chest: There is a moderate-sized left-sided pleural effusion with near complete collapse of the left lower lobe.The heart size is normal. Hepatobiliary: The liver is normal. Status post cholecystectomy.There is no biliary ductal dilation. Pancreas: There are multiple peripancreatic fluid collections the largest of which measures approximately 3.7 by 1.8 cm. The pancreas appears to enhance symmetrically. Spleen: The spleen is enlarged measuring approximately 14 cm craniocaudad. Adrenals/Urinary Tract: --Adrenal glands: No adrenal hemorrhage. --Right kidney/ureter: No hydronephrosis or perinephric hematoma. --Left kidney/ureter: There is no left-sided hydronephrosis. There is a complex collection in the left posterior pararenal space measuring approximately 8.5 x 2.5 cm. --Urinary bladder: Unremarkable. Stomach/Bowel:  --Stomach/Duodenum: There is some wall thickening of the stomach. --Small bowel: No dilatation or inflammation. --Colon: There are soft tissue densities along the descending colon measuring approximately 2.8 x 2.4 cm. There is a collection of the splenic flexure measuring 4.7 x 2.7 cm causing mass effect on the nearby:Marland Kitchen There is wall thickening of the transverse colon without evidence of an obstruction. --Appendix: Not visualized. No right lower quadrant inflammation or free fluid. Vascular/Lymphatic: Atherosclerotic changes are noted of the abdominal aorta without evidence of an abdominal aortic aneurysm. The portal vein and splenic vein remain patent. The splenic artery remains patent. --No retroperitoneal lymphadenopathy. --No mesenteric lymphadenopathy. --No pelvic or inguinal lymphadenopathy. Reproductive: Unremarkable Other: There is a small volume of free fluid in the pelvis. There are multiple scattered collections in the retroperitoneum and peritoneal cavity. A few these collections demonstrate mild rim enhancement. For example in the left upper quadrant there is a 3.9 x 1.9 cm collection that  demonstrates mild peripheral rim enhancement. There is a 3.8 by 4.4 cm collection in the region of the gallbladder fossa. Given the surgical clips in the gallbladder fossa this is favored to represent a loculated fluid collection as opposed to a remnant gallbladder. Musculoskeletal. No acute displaced fractures. IMPRESSION: 1. Overall findings concerning for pancreatitis with multiple loculated fluid collections as detailed above. Some of the smaller collections, for example in the left upper quadrant, demonstrate rim enhancement. An abscess is not excluded. There is no CT evidence for pancreatic necrosis. 2. Moderate-sized left-sided pleural effusion with at least partial collapse of the left lower lobe. 3. Diffuse wall thickening of the transverse colon and splenic flexure favored to be reactive. Other  considerations include infectious or inflammatory colitis. 4. Splenomegaly.  The splenic vein remains patent. Electronically Signed   By: Katherine Mantle M.D.   On: 06/20/2019 21:07   Dg Chest Port 1 View  Result Date: 07/05/2019 CLINICAL DATA:  Status post left-sided thoracentesis EXAM: PORTABLE CHEST 1 VIEW COMPARISON:  July 05, 2019 FINDINGS: The left-sided pleural effusion has significantly decreased in size from the prior study. There is no evidence for left-sided pneumothorax. Bibasilar airspace opacities are noted, left worse than right. The lung volumes are low. The heart size is stable from prior study. IMPRESSION: 1. No pneumothorax status post left-sided thoracentesis. 2. Significant interval decrease in size of the left-sided pleural effusion. 3. Bibasilar airspace opacities, left greater than right, favored to represent atelectasis. 4. Low lung volumes. Electronically Signed   By: Katherine Mantle M.D.   On: 07/05/2019 16:31   Dg Chest Portable 1 View  Result Date: 07/05/2019 CLINICAL DATA:  Chest pain. EXAM: PORTABLE CHEST 1 VIEW COMPARISON:  Radiograph 06/26/2019, CT 06/25/2019 FINDINGS: Re-accumulation of left pleural effusion after thoracentesis. Pleural effusion is moderate in size with associated underlying left lung base atelectasis/consolidation. Possible small right pleural effusion. Unchanged heart size and mediastinal contours. Increasing patchy right lung base opacity. No pneumothorax. IMPRESSION: 1. Re-accumulation of left pleural effusion after thoracentesis, now moderate in size. Associated left lung base atelectasis/consolidation. 2. Possible small right pleural effusion. Increasing right lung base opacity may be atelectasis or pneumonia. Electronically Signed   By: Narda Rutherford M.D.   On: 07/05/2019 03:22   Dg Chest Port 1 View  Result Date: 06/24/2019 CLINICAL DATA:  Chest pain for 1 hour EXAM: PORTABLE CHEST 1 VIEW COMPARISON:  06/20/2019 FINDINGS: Left-sided  pleural effusion is again seen and slightly enlarged when compare with the prior study. Likely underlying atelectasis/infiltrate is present in the left base. The right lung is clear. No bony abnormality is noted. IMPRESSION: Increasing left-sided pleural effusion. Electronically Signed   By: Alcide Clever M.D.   On: 06/24/2019 19:33   Dg Chest Portable 1 View  Result Date: 06/20/2019 CLINICAL DATA:  Chest pain EXAM: PORTABLE CHEST 1 VIEW COMPARISON:  None. FINDINGS: There is airspace consolidation in the left base with left pleural effusion. Lungs elsewhere are clear. Heart size and pulmonary vascularity are normal. No adenopathy. No bone lesions. IMPRESSION: Left lower lobe airspace consolidation consistent with pneumonia. Small left pleural effusion. Lungs elsewhere clear. No adenopathy. Heart size normal. Followup PA and lateral chest radiographs recommended in 3-4 weeks following trial of antibiotic therapy to ensure resolution and exclude underlying malignancy. Electronically Signed   By: Bretta Bang III M.D.   On: 06/20/2019 19:32   Ct Renal Stone Study  Result Date: 07/05/2019 CLINICAL DATA:  Chest pain. EXAM: CT ABDOMEN AND PELVIS WITHOUT  CONTRAST TECHNIQUE: Multidetector CT imaging of the abdomen and pelvis was performed following the standard protocol without IV contrast. COMPARISON:  Fifteen days ago FINDINGS: Lower chest: At least moderate left pleural effusion with multi segment atelectasis. Pending chest CT. Hepatobiliary: No focal liver abnormality.Cholecystectomy. Pancreas: History of recurrent pancreatitis. Progressive organized collections: 1. 8 x 4 cm along the upper greater curvature of the stomach 2. 6 cm along the mid greater curvature of the stomach-which may be connected to #1. 3. Peripancreatic to splenic tail, 15 x 4 by up to 6 cm-with further extent tracking inferiorly along the descending colon. 4. Ligamentum venosum of the liver measuring 5 x 3 cm 5. Ventral midline abdomen  at 5 cm 6. Lower ventral midline abdomen with interloop distortion, 9 cm. These collections exert mass effect on the stomach, transverse colon, and descending colon. Fat density with rim of stranding posterior to the left kidney in the posterior pararenal space, stable in compatible with fat necrosis. There is generalized retroperitoneal edema about the collections and pancreas. No gas within any of the collections to suggest fistula or definite superinfection. Spleen: Stable size and density. Adrenals/Urinary Tract: Negative adrenals. No hydronephrosis or stone. Unremarkable bladder. Stomach/Bowel: No obstruction. Low-density about the hepatic flexure is likely a combination of reactive wall thickening and pericolonic fluid. Vascular/Lymphatic: No visible acute vascular abnormality. No noted adenopathy. Reproductive:Negative Other: Right upper quadrant ascites which may be loculated. Musculoskeletal: No acute abnormalities. IMPRESSION: 1. Recent pancreatitis with multiple organized fluid collections in the abdomen as listed and measured above. The largest measures 15 x 4 x 6 cm. Collections exert mass effect on the stomach, transverse colon, descending colon without obstruction. 2. Stable left posterior pararenal fat necrosis. 3. At least moderate left pleural effusion with multi segment atelectasis. Electronically Signed   By: Marnee Spring M.D.   On: 07/05/2019 07:12   Ir Thoracentesis Asp Pleural Space W/img Guide  Result Date: 06/26/2019 INDICATION: Shortness of breath. Left-sided pleural effusion. Request for diagnostic and therapeutic thoracentesis. EXAM: ULTRASOUND GUIDED LEFT THORACENTESIS MEDICATIONS: None. COMPLICATIONS: None immediate. PROCEDURE: An ultrasound guided thoracentesis was thoroughly discussed with the patient and questions answered. The benefits, risks, alternatives and complications were also discussed. The patient understands and wishes to proceed with the procedure. Written consent  was obtained. Ultrasound was performed to localize and mark an adequate pocket of fluid in the left chest. The area was then prepped and draped in the normal sterile fashion. 1% Lidocaine was used for local anesthesia. Under ultrasound guidance a 6 Fr Safe-T-Centesis catheter was introduced. Thoracentesis was performed. The catheter was removed and a dressing applied. FINDINGS: A total of approximately 1.3 L of hazy yellow fluid was removed. Samples were sent to the laboratory as requested by the clinical team. IMPRESSION: Successful ultrasound guided left thoracentesis yielding 1.3 L of pleural fluid. Read by: Brayton El PA-C Electronically Signed   By: Richarda Overlie M.D.   On: 06/26/2019 09:19   US Thoracentesis Asp Pleural Space W/img Guide  Result Date: 07/05/2019 INDICATION: Patient with history of recurrent pancreatitis, GERD, dyspnea, recurrent left pleural effusion. Request made for diagnostic and therapeutic left thoracentesis. EXAM: ULTRASOUND GUIDED DIAGNOSTIC AND THERAPEUTIC LEFT THORACENTESIS MEDICATIONS: None COMPLICATIONS: None immediate. PROCEDURE: An ultrasound guided thoracentesis was thoroughly discussed with the patient and questions answered. The benefits, risks, alternatives and complications were also discussed. The patient understands and wishes to proceed with the procedure. Written consent was obtained. Ultrasound was performed to localize and mark an adequate pocket of fluid  in the left chest. The area was then prepped and draped in the normal sterile fashion. 1% Lidocaine was used for local anesthesia. Under ultrasound guidance a 6 Fr Safe-T-Centesis catheter was introduced. Thoracentesis was performed. The catheter was removed and a dressing applied. FINDINGS: A total of approximately 770 cc of yellow fluid was removed. Samples were sent to the laboratory as requested by the clinical team. IMPRESSION: Successful ultrasound guided diagnostic and therapeutic left thoracentesis yielding  770 cc of pleural fluid. Read by: Jeananne Rama, PA-C Electronically Signed   By: Richarda Overlie M.D.   On: 07/05/2019 16:12    Time Spent in minutes  35   Arlita Buffkin M.D on 07/08/2019 at 3:29 PM  Between 7am to 7pm - Pager - [phone]  After 7pm go to [email or web address] - password [facility]  [facility] -  Office  [phone]

## 2019-07-08 NOTE — Plan of Care (Signed)
  Problem: Clinical Measurements: Goal: Respiratory complications will improve Outcome: Progressing  Patient remains on 2L O2.  Encouraged IS and walking.  Patient meeting 750 on IS, instructed to continue doing every hour while awake

## 2019-07-08 NOTE — Progress Notes (Addendum)
Pt IV found leaking, bed soaked. IV team consult put in and new IV put in. New IV infiltrated when used and had to be removed. IV team is asking for a new order for midline or something else as this this is Pt 9th stick. On call  (K. Schorr) made aware of situation, awaiting orders. Pt on PCA pump and ABT. At 2336 IV team reached out that she is unable to insert a midline not trained to do that yet. Reached out to on call K. Schorr for further instructions. Pt c/o severe pain.  New orders for dilaudid IM received @ 2342

## 2019-07-08 NOTE — Progress Notes (Signed)
Patient ID: Kyle Mooney, male   DOB: [birth year], 48 y.o.   MRN: [id number]       Subjective: Patient feels better overall today.  Uses his PCA 3-4 times every couple of hours.  Pain is generally around a 5 he says.    Objective: Vital signs in last 24 hours: Temp:  [98.3 F (36.8 C)-99.5 F (37.5 C)] 98.8 F (37.1 C) (08/19 0827) Pulse Rate:  [100-128] 115 (08/19 0827) Resp:  [17-24] 20 (08/19 0832) BP: (117-143)/(73-98) 130/82 (08/19 0827) SpO2:  [93 %-100 %] 95 % (08/19 0832) FiO2 (%):  [33 %] 33 % (08/18 0932) Weight:  [81.9 kg] 81.9 kg (08/19 0500) Last BM Date: 07/07/19  Intake/Output from previous day: 08/18 0701 - 08/19 0700 In: 500.9 [P.O.:140; I.V.:211.7; IV Piggyback:149.2] Out: 525 [Urine:525] Intake/Output this shift: No intake/output data recorded.  PE: Heart: tachy, but regular Lungs: CTAB Abd: soft, but tender still in epigastrium, +BS, ND  Lab Results:  Recent Labs    07/07/19 0221 07/08/19 0403  WBC 20.0* 21.9*  HGB 10.4* 10.3*  HCT 34.2* 33.3*  PLT 702* 662*   BMET Recent Labs    07/07/19 0221 07/08/19 0403  NA 132* 130*  K 4.1 4.0  CL 96* 94*  CO2 27 27  GLUCOSE 108* 106*  BUN 12 12  CREATININE 0.63 0.51*  CALCIUM 8.6* 8.3*   PT/INR No results for input(s): LABPROT, INR in the last 72 hours. CMP     Component Value Date/Time   NA 130 (L) 07/08/2019 0403   K 4.0 07/08/2019 0403   CL 94 (L) 07/08/2019 0403   CO2 27 07/08/2019 0403   GLUCOSE 106 (H) 07/08/2019 0403   BUN 12 07/08/2019 0403   CREATININE 0.51 (L) 07/08/2019 0403   CALCIUM 8.3 (L) 07/08/2019 0403   PROT 5.8 (L) 07/08/2019 0403   ALBUMIN 1.6 (L) 07/08/2019 0403   AST 25 07/08/2019 0403   ALT 20 07/08/2019 0403   ALKPHOS 139 (H) 07/08/2019 0403   BILITOT 0.8 07/08/2019 0403   GFRNONAA >60 07/08/2019 0403   GFRAA >60 07/08/2019 0403   Lipase     Component Value Date/Time   LIPASE 122 (H) 07/08/2019 0403       Studies/Results: No results found.   Anti-infectives: Anti-infectives (From admission, onward)   Start     Dose/Rate Route Frequency Ordered Stop   07/05/19 1800  ceFEPIme (MAXIPIME) 2 g in sodium chloride 0.9 % 100 mL IVPB  Status:  Discontinued     2 g 200 mL/hr over 30 Minutes Intravenous Every 8 hours 07/05/19 0852 07/05/19 1013   07/05/19 1400  meropenem (MERREM) 1 g in sodium chloride 0.9 % 100 mL IVPB     1 g 200 mL/hr over 30 Minutes Intravenous Every 8 hours 07/05/19 1013 07/15/19 1359   07/05/19 1000  metroNIDAZOLE (FLAGYL) IVPB 500 mg  Status:  Discontinued     500 mg 100 mL/hr over 60 Minutes Intravenous Every 8 hours 07/05/19 0833 07/05/19 1013   07/05/19 0345  vancomycin (VANCOCIN) IVPB 1000 mg/200 mL premix  Status:  Discontinued     1,000 mg 200 mL/hr over 60 Minutes Intravenous  Once 07/05/19 0333 07/05/19 0336   07/05/19 0345  ceFEPIme (MAXIPIME) 2 g in sodium chloride 0.9 % 100 mL IVPB     2 g 200 mL/hr over 30 Minutes Intravenous  Once 07/05/19 0333 07/05/19 1001   07/05/19 0345  vancomycin (VANCOCIN) 1,500 mg in sodium chloride 0.9 % 500  mL IVPB     1,500 mg 250 mL/hr over 120 Minutes Intravenous  Once 07/05/19 0336 07/05/19 1001       Assessment/Plan Pancreatitis with pseudocysts  -says pain is better today. -ideally these pseudocysts will need to mature for at least 6 weeks and then could attempt cystogastrostomy either laparoscopically or endoscopically.  -patient has established care at [facility].  He is schedule for an MRI next week and follow up with GI, Dr. Oren Beckmann there. -while he is here we will continued to follow. -await GI opinion on diet today.  ? Retry liquids or cortrak and TFs.   FEN -IVFs,NPO, breeze VTE -Lovenox ID -Merrem   LOS: 3 days    Henreitta Cea , [facility] 07/08/2019, 8:55 AM Pager: [phone]

## 2019-07-08 NOTE — Progress Notes (Signed)
Physical Therapy Treatment Patient Details Name: Kyle Mooney MRN: [id number] DOB: July 30, 1971 Today's Date: 07/08/2019    History of Present Illness 48 year old gentleman with prior history of recurrent pancreatitis IBS, GERD follows up with [facility] for his idiopathic pancreatitis and pancreatic cysts and admitted for idiopathic acute pancreatitis    PT Comments    Pt with improved tolerance for ambulation this session, with progression of AD from RW to IV pole. Pt is insecure about standing balance, but PT thinks pt can progress to no AD with continued ambulation. Pt with RR up to 25 with ambulation this session, requiring 2LO2 to maintain sats >90%. PT to continue to follow acutely.    Follow Up Recommendations  Home health PT;Outpatient PT(OP if possible)     Equipment Recommendations  None recommended by PT    Recommendations for Other Services       Precautions / Restrictions Precautions Precautions: Fall Restrictions Weight Bearing Restrictions: No    Mobility  Bed Mobility Overal bed mobility: Needs Assistance Bed Mobility: Sidelying to Sit;Rolling Rolling: Min guard Sidelying to sit: Min assist       General bed mobility comments: Min guard for rolling with verbal cuing for use of UEs on bedrails to assist, min assist for sidelying to sit for trunk elevation via HHA.  Transfers Overall transfer level: Needs assistance Equipment used: None Transfers: Sit to/from Stand Sit to Stand: Min guard         General transfer comment: Min guard for safety, increased time to rise and reaching for environment for steadying.  Ambulation/Gait Ambulation/Gait assistance: Min guard Gait Distance (Feet): 160 Feet Assistive device: IV Pole Gait Pattern/deviations: Step-through pattern;Decreased stride length;Trunk flexed Gait velocity: decr, slow shuffling steps   General Gait Details: Min guard for safety, with occasional assist for redirecting IV pole. HR up to  110 bpm, with SpO2 of 84% on 1LO2 during ambulation, PT increased O2 to 2L to maintain sats >90%. Pt with forward flexed posture due to abdominal pain.   Stairs             Wheelchair Mobility    Modified Rankin (Stroke Patients Only)       Balance Overall balance assessment: Needs assistance;History of Falls   Sitting balance-Leahy Scale: Good     Standing balance support: Single extremity supported Standing balance-Leahy Scale: Fair                              Cognition Arousal/Alertness: Awake/alert Behavior During Therapy: WFL for tasks assessed/performed Overall Cognitive Status: Within Functional Limits for tasks assessed                                        Exercises      General Comments        Pertinent Vitals/Pain Pain Assessment: 0-10 Pain Score: 4  Pain Location: abdomen Pain Descriptors / Indicators: Aching;Discomfort Pain Intervention(s): Limited activity within patient's tolerance;Monitored during session;Repositioned;PCA encouraged    Home Living                      Prior Function            PT Goals (current goals can now be found in the care plan section) Acute Rehab PT Goals PT Goal Formulation: With patient Time For Goal Achievement: 07/21/19 Potential to  Achieve Goals: Good Progress towards PT goals: Progressing toward goals    Frequency    Min 3X/week      PT Plan Current plan remains appropriate    Co-evaluation              AM-PAC PT "6 Clicks" Mobility   Outcome Measure  Help needed turning from your back to your side while in a flat bed without using bedrails?: A Little Help needed moving from lying on your back to sitting on the side of a flat bed without using bedrails?: A Little Help needed moving to and from a bed to a chair (including a wheelchair)?: A Little Help needed standing up from a chair using your arms (e.g., wheelchair or bedside chair)?: A  Little Help needed to walk in hospital room?: A Little Help needed climbing 3-5 steps with a railing? : A Little 6 Click Score: 18    End of Session Equipment Utilized During Treatment: Gait belt Activity Tolerance: Patient tolerated treatment well Patient left: in chair;with call bell/phone within reach(aware to use call bell for assist out of recliner) Nurse Communication: Mobility status PT Visit Diagnosis: Difficulty in walking, not elsewhere classified (R26.2)     Time: 9147-8295 PT Time Calculation (min) (ACUTE ONLY): 24 min  Charges:  $Gait Training: 23-37 mins                     Kyle Mooney, PT Acute Rehabilitation Services Pager [phone]  Office [phone]  Kyle Mooney 07/08/2019, 1:56 PM

## 2019-07-08 NOTE — Progress Notes (Addendum)
UNASSIGNED PATIENT Subjective: [name] is a 48 year old white male with recurrent pancreatitis and pseudocyst along with reflux and IBS who is been followed at [facility].  He was hospitalized here at [facility] for worsening abdominal pain from his pancreatitis.  He has had not been able to tolerate his p.o. intake and develops nausea when he tries full liquids.  Therefore feeding tube has been suggested so that his nutritional status improves before he can have surgery for the pseudocyst.  He denies any other active problems at this time document according to Dr. Verita Lamb notes plans are to transfer her to him to [facility] when a bed is available there.  Objective: Vital signs in last 24 hours: Temp:  [98.3 F (36.8 C)-99.5 F (37.5 C)] 98.5 F (36.9 C) (08/19 1236) Pulse Rate:  [96-128] 96 (08/19 1236) Resp:  [17-24] 17 (08/19 1239) BP: (123-143)/(76-89) 132/76 (08/19 1236) SpO2:  [93 %-100 %] 97 % (08/19 1239) Weight:  [81.9 kg] 81.9 kg (08/19 0500) Last BM Date: 07/07/19  Intake/Output from previous day: 08/18 0701 - 08/19 0700 In: 500.9 [P.O.:140; I.V.:211.7; IV Piggyback:149.2] Out: 525 [Urine:525] Intake/Output this shift: No intake/output data recorded.  General appearance: alert, cooperative, appears stated age, fatigued, no distress and pale Resp: clear to auscultation bilaterally Cardio: regular rate and rhythm, S1, S2 normal, no murmur, click, rub or gallop GI: soft, non-tender; bowel sounds normal; no masses,  no organomegaly Extremities: extremities normal, atraumatic, no cyanosis or edema  Lab Results: Recent Labs    07/06/19 0212 07/07/19 0221 07/08/19 0403  WBC 25.4* 20.0* 21.9*  HGB 11.7* 10.4* 10.3*  HCT 38.5* 34.2* 33.3*  PLT 905* 702* 662*   BMET Recent Labs    07/06/19 0212 07/07/19 0221 07/08/19 0403  NA 133* 132* 130*  K 4.4 4.1 4.0  CL 98 96* 94*  CO2 24 27 27   GLUCOSE 122* 108* 106*  BUN 14 12 12    CREATININE 0.77 0.63 0.51*  CALCIUM 8.7* 8.6* 8.3*   LFT Recent Labs    07/08/19 0403  PROT 5.8*  ALBUMIN 1.6*  AST 25  ALT 20  ALKPHOS 139*  BILITOT 0.8   Medications: I have reviewed the patient's current medications.  Assessment/Plan: 1) Acute pancreatitis with uninfected necrosis with extensive retroperitoneal inflammation multiple peripancreatic fluid collections on recent CT-being managed with IV fluids morphine PCA and and empiric IV antibiotics.  Agree with stroke core track for feeding to improve patient's nutritional status.  I agree with Dr. Beatrix Shipper patient will be better served at [facility] where has had all his care in the past. 2) Recent left-sided pleural effusion with left lower lung collapse status post thoracentesis 3) Severe protein calorie malnutrition. 4) Tobacco abuse.  LOS: 3 days   Juanita Craver 07/08/2019, 2:15 PM

## 2019-07-08 NOTE — Plan of Care (Signed)
Providers are keeping the patient educated in the treatment of the patient

## 2019-07-09 ENCOUNTER — Inpatient Hospital Stay (HOSPITAL_COMMUNITY): Payer: Self-pay

## 2019-07-09 DIAGNOSIS — J90 Pleural effusion, not elsewhere classified: Secondary | ICD-10-CM

## 2019-07-09 DIAGNOSIS — E43 Unspecified severe protein-calorie malnutrition: Secondary | ICD-10-CM

## 2019-07-09 LAB — GLUCOSE, CAPILLARY: Glucose-Capillary: 98 mg/dL (ref 70–99)

## 2019-07-09 LAB — BASIC METABOLIC PANEL
Anion gap: 8 (ref 5–15)
BUN: 10 mg/dL (ref 6–20)
CO2: 27 mmol/L (ref 22–32)
Calcium: 8 mg/dL — ABNORMAL LOW (ref 8.9–10.3)
Chloride: 93 mmol/L — ABNORMAL LOW (ref 98–111)
Creatinine, Ser: 0.47 mg/dL — ABNORMAL LOW (ref 0.61–1.24)
GFR calc Af Amer: >60 mL/min (ref >60)
GFR calc non Af Amer: >60 mL/min (ref >60)
Glucose, Bld: 94 mg/dL (ref 70–99)
Potassium: 3.7 mmol/L (ref 3.5–5.1)
Sodium: 128 mmol/L — ABNORMAL LOW (ref 135–145)

## 2019-07-09 LAB — CBC WITH DIFFERENTIAL/PLATELET
Abs Immature Granulocytes: 0.15 10*3/uL — ABNORMAL HIGH (ref 0.00–0.07)
Basophils Absolute: 0 10*3/uL (ref 0.0–0.1)
Basophils Relative: 0 %
Eosinophils Absolute: 0.1 10*3/uL (ref 0.0–0.5)
Eosinophils Relative: 1 %
HCT: 31.6 % — ABNORMAL LOW (ref 39.0–52.0)
Hemoglobin: 9.8 g/dL — ABNORMAL LOW (ref 13.0–17.0)
Immature Granulocytes: 1 %
Lymphocytes Relative: 10 %
Lymphs Abs: 2.1 10*3/uL (ref 0.7–4.0)
MCH: 27.1 pg (ref 26.0–34.0)
MCHC: 31 g/dL (ref 30.0–36.0)
MCV: 87.5 fL (ref 80.0–100.0)
Monocytes Absolute: 1.5 10*3/uL — ABNORMAL HIGH (ref 0.1–1.0)
Monocytes Relative: 7 %
Neutro Abs: 16.5 10*3/uL — ABNORMAL HIGH (ref 1.7–7.7)
Neutrophils Relative %: 81 %
Platelets: 564 10*3/uL — ABNORMAL HIGH (ref 150–400)
RBC: 3.61 MIL/uL — ABNORMAL LOW (ref 4.22–5.81)
RDW: 13.8 % (ref 11.5–15.5)
WBC: 20.4 10*3/uL — ABNORMAL HIGH (ref 4.0–10.5)
nRBC: 0 % (ref 0.0–0.2)

## 2019-07-09 LAB — MAGNESIUM: Magnesium: 2 mg/dL (ref 1.7–2.4)

## 2019-07-09 LAB — PHOSPHORUS: Phosphorus: 3.3 mg/dL (ref 2.5–4.6)

## 2019-07-09 MED ORDER — HYDROMORPHONE HCL 1 MG/ML IJ SOLN
2.0000 mg | INTRAMUSCULAR | Status: AC | PRN
  Filled 2019-07-09: qty 2

## 2019-07-09 MED ORDER — PRO-STAT SUGAR FREE PO LIQD
30.0000 mL | Freq: Two times a day (BID) | ORAL | Status: DC
  Administered 2019-07-09 – 2019-07-14 (×8): 30 mL
  Filled 2019-07-09 (×9): qty 30

## 2019-07-09 MED ORDER — SODIUM CHLORIDE 0.9% FLUSH
10.0000 mL | INTRAVENOUS | Status: DC | PRN
  Administered 2019-07-10: 10 mL
  Filled 2019-07-09: qty 40

## 2019-07-09 MED ORDER — SODIUM CHLORIDE 0.9% FLUSH
10.0000 mL | Freq: Two times a day (BID) | INTRAVENOUS | Status: DC
  Administered 2019-07-10 – 2019-07-14 (×5): 10 mL

## 2019-07-09 MED ORDER — OSMOLITE 1.5 CAL PO LIQD
1000.0000 mL | ORAL | Status: DC
  Administered 2019-07-09 – 2019-07-14 (×4): 1000 mL
  Filled 2019-07-09 (×9): qty 1000

## 2019-07-09 MED ORDER — VITAL HIGH PROTEIN PO LIQD: 1000.0000 mL | ORAL | Status: DC

## 2019-07-09 MED ORDER — FREE WATER
100.0000 mL | Status: DC
  Administered 2019-07-09 – 2019-07-15 (×34): 100 mL

## 2019-07-09 MED ORDER — ADULT MULTIVITAMIN LIQUID CH
15.0000 mL | Freq: Every day | ORAL | Status: DC
  Administered 2019-07-11 – 2019-07-12 (×2): 15 mL
  Filled 2019-07-09 (×5): qty 15

## 2019-07-09 NOTE — Progress Notes (Signed)
Subjective: No new complaints.  Objective: Vital signs in last 24 hours: Temp:  [97.8 F (36.6 C)-98.8 F (37.1 C)] 98.3 F (36.8 C) (08/20 1000) Pulse Rate:  [96-113] 109 (08/20 1000) Resp:  [15-23] 20 (08/20 1000) BP: (114-139)/(61-80) 139/79 (08/20 1000) SpO2:  [92 %-98 %] 95 % (08/20 1000) Last BM Date: 07/09/19  Intake/Output from previous day: 08/19 0701 - 08/20 0700 In: 583.3 [P.O.:60; I.V.:90.2; IV Piggyback:433.1] Out: -  Intake/Output this shift: No intake/output data recorded.  General appearance: alert and no distress GI: tender in the abdomen  Lab Results: Recent Labs    07/07/19 0221 07/08/19 0403 07/09/19 0420  WBC 20.0* 21.9* 20.4*  HGB 10.4* 10.3* 9.8*  HCT 34.2* 33.3* 31.6*  PLT 702* 662* 564*   BMET Recent Labs    07/07/19 0221 07/08/19 0403 07/09/19 0420  NA 132* 130* 128*  K 4.1 4.0 3.7  CL 96* 94* 93*  CO2 27 27 27   GLUCOSE 108* 106* 94  BUN 12 12 10   CREATININE 0.63 0.51* 0.47*  CALCIUM 8.6* 8.3* 8.0*   LFT Recent Labs    07/08/19 0403  PROT 5.8*  ALBUMIN 1.6*  AST 25  ALT 20  ALKPHOS 139*  BILITOT 0.8   PT/INR No results for input(s): LABPROT, INR in the last 72 hours. Hepatitis Panel No results for input(s): HEPBSAG, HCVAB, HEPAIGM, HEPBIGM in the last 72 hours. C-Diff No results for input(s): CDIFFTOX in the last 72 hours. Fecal Lactopherrin No results for input(s): FECLLACTOFRN in the last 72 hours.  Studies/Results: No results found.  Medications:  Scheduled: . Chlorhexidine Gluconate Cloth  6 each Topical Q0600  . enoxaparin (LOVENOX) injection  40 mg Subcutaneous Q24H  . feeding supplement  1 Container Oral TID BM  . feeding supplement (PRO-STAT SUGAR FREE 64)  30 mL Oral BID  . folic acid  1 mg Oral Daily  . guaiFENesin  600 mg Oral BID  . lip balm  1 application Topical BID  . metoprolol tartrate  25 mg Oral BID  . morphine   Intravenous Q4H  . multivitamin with minerals  1 tablet Oral Daily  .  thiamine  100 mg Oral Daily   Or  . thiamine  100 mg Intravenous Daily   Continuous: . sodium chloride 250 mL (07/07/19 2035)  . famotidine (PEPCID) IV Stopped (07/08/19 2348)  . meropenem (MERREM) IV Stopped (07/09/19 [id number])    Assessment/Plan: 1) Acute idiopathic pancreatitis. 2) Pseudocysts. 3) Malnutrition.   The patient reports that he was not able to rest for the past two days as a result of disturbances from staff, I.e., BP checks, etc.  He also states that feeding tubes were attempted three times at [facility] without success, but he is willing to try.  He is in pain as he lost all IV access last evening as a result of infiltration.  Plan: 1) Reestablish IVs. 2) Agree with feeding tube. 3) Continue supportive care.  LOS: 4 days   Catrina Fellenz D 07/09/2019, 10:35 AM

## 2019-07-09 NOTE — Plan of Care (Signed)
  Problem: Coping: Goal: Level of anxiety will decrease Outcome: Progressing Patient reports being able to rest this afternoon.  Requesting that staff let him rest without interruption and states he was "able to rest and feels better."  Problem: Elimination: Goal: Will not experience complications related to bowel motility Outcome: Progressing   Problem: Safety: Goal: Ability to remain free from injury will improve Outcome: Progressing   Problem: Skin Integrity: Goal: Risk for impaired skin integrity will decrease Outcome: Progressing Redness to sacrum.  Sacral foam dressing.  Patient educated on shifting position when lying in bed or sitting in chair.  Problem: Education: Goal: Knowledge of General Education information will improve Description: Including pain rating scale, medication(s)/side effects and non-pharmacologic comfort measures Outcome: Progressing

## 2019-07-09 NOTE — Progress Notes (Signed)
Patient willing to have Coretrak tube placed.  ICU RN notified.  Dr. Clementeen Graham notified tube to be placed this evening.

## 2019-07-09 NOTE — Progress Notes (Signed)
CoreTrack feeding tube placed by ICU RN.  Order placed for abdominal xray for placement verification.

## 2019-07-09 NOTE — Progress Notes (Signed)
Midline 18gx8cm was placed in the left upper arm/brachial vein without difficulty. Great blood return. Ready for use.

## 2019-07-09 NOTE — Progress Notes (Signed)
IV consult for Midline ordered by Velva Harman, RN. I am not Midline trained and am the only IV team nurse here until the next shift. Primary RN Velva Harman notified. IV consult completed.

## 2019-07-09 NOTE — Progress Notes (Signed)
Nutrition Follow-up  RD working remotely.   DOCUMENTATION CODES:   Not applicable  INTERVENTION:  - will order TF: Osmolite 1.5 @ 30 ml/hr to advance by 10 ml every 12 hours to reach goal rate of 60 ml/hr with 30 ml prostat BID and 100 ml free water every 4 hours.  - at goal rate this regimen will provide 2360 kcal, 120 grams protein, and 1697 ml free water.   Monitor magnesium, potassium, and phosphorus daily for at least 3 days, MD to replete as needed, as pt is at risk for refeeding syndrome given acute illness, poor intakes for at least 3 weeks.    NUTRITION DIAGNOSIS:   Inadequate oral intake related to inability to eat as evidenced by NPO status. -revised  GOAL:   Patient will meet greater than or equal to 90% of their needs -unmet/unable to meet at this time.  MONITOR:   TF tolerance, Labs, Weight trends  REASON FOR ASSESSMENT:   Consult Enteral/tube feeding initiation and management  ASSESSMENT:   48 year old gentleman with prior history of recurrent pancreatitis, IBS, and GERD. He follows up with [facility] for his idiopathic pancreatitis and pancreatic cysts. He has been experiencing recurrent epigastric pain suspicious for acute on chronic pancreatitis. He underwent CT chest which was negative for pulmonary embolism, but did show large layering L pleural effusion with near complete lower lobe collapse. IR consulted and he underwent thoracentesis for left pleural effusion; fluid sent for analysis. Surgery has been consulted for evaluation of the pseudocyst of the pancreas and recommended to wait 6 weeks for the pseudocyst to mature and then follow-up with Dr. Johney Maine versus follow-up with [facility] for further evaluation. Patient is currently on IV PCA morphine and his pain is well controlled.  Weight +8 lb from 8/18-8/19. Will continue to monitor weight recordings as patient has been NPO since 8/18 at 0839. Able to communicate with Dr. Clementeen Graham earlier today and plan is  for ICU RN to place small bore NGT and to start TF after tube placement. TF order outlined above.   Dr. Ulyses Amor note from earlier today indicates that patient informed him that NGT placement had been attempted x3 without success at [facility] in the past. Patient remains willing to try to have tube placed here.    Labs reviewed; Na: 128 mmol/l, Cl: 93 mmol/l, creatinine: 0.47 mg/dl, Ca: 8 mg/dl. Medications reviewed; 20 IV pepcid BID, 1 mg oral folvite/day, daily multivitamin with minerals, 100 mg thiamine/day.      Diet Order:   Diet Order            Diet NPO time specified Except for: Ice Chips, Sips with Meds, Other (See Comments)  Diet effective now              EDUCATION NEEDS:   No education needs have been identified at this time  Skin:     Last BM:  8/20  Height:   Ht Readings from Last 1 Encounters:  07/07/19 6\' 1"  (1.854 m)    Weight:   Wt Readings from Last 1 Encounters:  07/08/19 81.9 kg    Ideal Body Weight:  83.6 kg  BMI:  Body mass index is 23.82 kg/m.  Estimated Nutritional Needs:   Kcal:  2200-2400 kcal  Protein:  110-125 grams  Fluid:  >/= 2 L/day     Kyle Matin, MS, RD, LDN, [facility] Clinical Dietitian Pager # [phone] After hours/weekend pager # [phone]

## 2019-07-09 NOTE — Progress Notes (Signed)
VAST paged to determine time for midline placement. Spoke with pt's nurse and explained that this VAS RN cannot place midlines, but a colleague will be coming shortly to place ML. Explained an exact time could not be given, but will try to complete by lunchtime. Pt's nurse verbalized understanding.

## 2019-07-09 NOTE — Progress Notes (Signed)
Patient ID: Kyle Mooney, male   DOB: [birth year], 48 y.o.   MRN: [id number]       Subjective: Had a bad night.  Lost IV access so hasn't had good pain control or sleep.  Reluctantly seems agreeable to a Cortrak for nutritional support  Objective: Vital signs in last 24 hours: Temp:  [97.8 F (36.6 C)-98.8 F (37.1 C)] 97.8 F (36.6 C) (08/20 0726) Pulse Rate:  [96-113] 107 (08/20 0837) Resp:  [15-23] 18 (08/20 0837) BP: (114-132)/(61-80) 114/61 (08/20 0726) SpO2:  [92 %-98 %] 95 % (08/20 0726) Last BM Date: 07/09/19  Intake/Output from previous day: 08/19 0701 - 08/20 0700 In: 583.3 [P.O.:60; I.V.:90.2; IV Piggyback:433.1] Out: -  Intake/Output this shift: No intake/output data recorded.  PE: Heart: regular, tachy Lungs: CTAB Abd: soft, but tender across upper abdomen, +BS, mild bloating  Lab Results:  Recent Labs    07/08/19 0403 07/09/19 0420  WBC 21.9* 20.4*  HGB 10.3* 9.8*  HCT 33.3* 31.6*  PLT 662* 564*   BMET Recent Labs    07/08/19 0403 07/09/19 0420  NA 130* 128*  K 4.0 3.7  CL 94* 93*  CO2 27 27  GLUCOSE 106* 94  BUN 12 10  CREATININE 0.51* 0.47*  CALCIUM 8.3* 8.0*   PT/INR No results for input(s): LABPROT, INR in the last 72 hours. CMP     Component Value Date/Time   NA 128 (L) 07/09/2019 0420   K 3.7 07/09/2019 0420   CL 93 (L) 07/09/2019 0420   CO2 27 07/09/2019 0420   GLUCOSE 94 07/09/2019 0420   BUN 10 07/09/2019 0420   CREATININE 0.47 (L) 07/09/2019 0420   CALCIUM 8.0 (L) 07/09/2019 0420   PROT 5.8 (L) 07/08/2019 0403   ALBUMIN 1.6 (L) 07/08/2019 0403   AST 25 07/08/2019 0403   ALT 20 07/08/2019 0403   ALKPHOS 139 (H) 07/08/2019 0403   BILITOT 0.8 07/08/2019 0403   GFRNONAA >60 07/09/2019 0420   GFRAA >60 07/09/2019 0420   Lipase     Component Value Date/Time   LIPASE 122 (H) 07/08/2019 0403       Studies/Results: No results found.  Anti-infectives: Anti-infectives (From admission, onward)   Start     Dose/Rate  Route Frequency Ordered Stop   07/05/19 1800  ceFEPIme (MAXIPIME) 2 g in sodium chloride 0.9 % 100 mL IVPB  Status:  Discontinued     2 g 200 mL/hr over 30 Minutes Intravenous Every 8 hours 07/05/19 0852 07/05/19 1013   07/05/19 1400  meropenem (MERREM) 1 g in sodium chloride 0.9 % 100 mL IVPB     1 g 200 mL/hr over 30 Minutes Intravenous Every 8 hours 07/05/19 1013 07/15/19 1359   07/05/19 1000  metroNIDAZOLE (FLAGYL) IVPB 500 mg  Status:  Discontinued     500 mg 100 mL/hr over 60 Minutes Intravenous Every 8 hours 07/05/19 0833 07/05/19 1013   07/05/19 0345  vancomycin (VANCOCIN) IVPB 1000 mg/200 mL premix  Status:  Discontinued     1,000 mg 200 mL/hr over 60 Minutes Intravenous  Once 07/05/19 0333 07/05/19 0336   07/05/19 0345  ceFEPIme (MAXIPIME) 2 g in sodium chloride 0.9 % 100 mL IVPB     2 g 200 mL/hr over 30 Minutes Intravenous  Once 07/05/19 0333 07/05/19 1001   07/05/19 0345  vancomycin (VANCOCIN) 1,500 mg in sodium chloride 0.9 % 500 mL IVPB     1,500 mg 250 mL/hr over 120 Minutes Intravenous  Once 07/05/19 0336  07/05/19 1001       Assessment/Plan Pancreatitis with pseudocysts  -patient has been told from all 3 services that he likely needs a cortrak for feeding.  He seems to be reluctantly agreeable to this today. If that is the case, would do this for nutrition as I do not feel he will be able to eat well enough between pain and compression on his stomach to nourish himself completely. -ideally these pseudocysts will need to mature for at least 6 weeks and then could attempt cystogastrostomy either laparoscopically or endoscopically.  -patient has [facility] care at [facility]. He is schedule for an MRI next week and follow up with GI, Dr. Oren Beckmann there. -while he is here we will continued to follow.  FEN -IVFs,NPO, breeze VTE -Lovenox ID -Merrem   LOS: 4 days    Henreitta Cea , [facility] 07/09/2019, 8:46 AM Pager: [phone]

## 2019-07-09 NOTE — Progress Notes (Signed)
PROGRESS NOTE                                                                                                                                                                                                             Patient Demographics:    Kyle Mooney, is a 48 y.o. male, DOB - 05-26-1971, OZH:[id number]  Admit date - 07/05/2019   Admitting Physician A Grier Mitts., MD  Outpatient Primary MD for the patient is Patient, No Pcp Per  LOS - 4  [facility]  Chief Complaint  Patient presents with   Chest Pain   Abdominal Pain       Brief Narrative    48 year old male with recurrent pancreatitis with developing pseudocyst, IBS and GERD who is followed at [facility] for his idiopathic pancreatitis and pancreatic cyst presented with recurrent epigastric pain suspicious for acute on chronic pancreatitis.  He was last hospitalized at [facility] on 06/02/2019 with necrotizing pancreatitis.  CT angiogram of the chest was negative for PE but showed large layering left pleural effusion with near complete lower lobe collapse.  IR consulted and underwent thoracentesis of the left pleural effusion.  Surgery consulted for evaluation of pseudocyst and recommended to wait for 6 weeks for the pseudocyst to mature and either follow-up with Dr. gross or at [facility] for further evaluation. Given his persistent pain he has been requiring IV morphine PCA.   Subjective:   Patient lost IV and was unable to get an access overnight.  Reported a lot of pain and unable to sleep.  Got 1-2 doses of IM morphine.  Finally agrees to get a core track placed for tube feeding.   Assessment  & Plan :    Principal Problem:   Idiopathic acute pancreatitis with uninfected necrosis Being managed with IV fluids, morphine PCA and empiric IV antibiotics GI and surgery following closely.  Surgery recommends ideally the  pseudocyst will need to measure for at least 6 weeks to attempt cystoscopy gastrostomy.   Has very poor nutrition with low albumin.  Will need to feeding through coretrak Finally agrees.  Will attempt to place today.  Attempted to transfer patient to [facility] since he is followed by both GI and surgery there.  They did not have any bed.  Will attempt again.  Active Problems:  Left-sided large pleural effusion Thoracentesis on 8/16 appears exudative.?  Parapneumonic.  Cultures negative.  Stable on 3 L via nasal cannula.  On empiric IV meropenem.  Still has significant leukocytosis but hemodynamically stable.  Hyponatremia Possibly prerenal with dehydration.  Will place him on IV fluids once IV access obtained.  Severe protein calorie malnutrition (HCC) N.p.o. with abdominal pain.  Resume tube feeds once coretrak placed.  Nutrition on board.  Tobacco abuse Counseled on cessation  Alcohol abuse No signs of withdrawal.  Monitor on CIWA.  Counseled on cessation.  Mild hyponatremia Stable.  Monitor  Leukocytosis and thrombocytosis Likely reactive with acute pancreatitis Monitor on antibiotics.    Code Status : Full code  Family Communication  : None  Disposition Plan  : Possible transfer to [facility] versus home once improved  Barriers For Discharge : Active symptoms  Consults  : GI/surgery  Procedures  : CT abdomen pelvis, left thoracentesis  DVT Prophylaxis  :  Lovenox -   Lab Results  Component Value Date   PLT 564 (H) 07/09/2019    Antibiotics  :    Anti-infectives (From admission, onward)   Start     Dose/Rate Route Frequency Ordered Stop   07/05/19 1800  ceFEPIme (MAXIPIME) 2 g in sodium chloride 0.9 % 100 mL IVPB  Status:  Discontinued     2 g 200 mL/hr over 30 Minutes Intravenous Every 8 hours 07/05/19 0852 07/05/19 1013   07/05/19 1400  meropenem (MERREM) 1 g in sodium chloride 0.9 % 100 mL IVPB     1 g 200 mL/hr over 30 Minutes Intravenous Every 8  hours 07/05/19 1013 07/15/19 1359   07/05/19 1000  metroNIDAZOLE (FLAGYL) IVPB 500 mg  Status:  Discontinued     500 mg 100 mL/hr over 60 Minutes Intravenous Every 8 hours 07/05/19 0833 07/05/19 1013   07/05/19 0345  vancomycin (VANCOCIN) IVPB 1000 mg/200 mL premix  Status:  Discontinued     1,000 mg 200 mL/hr over 60 Minutes Intravenous  Once 07/05/19 0333 07/05/19 0336   07/05/19 0345  ceFEPIme (MAXIPIME) 2 g in sodium chloride 0.9 % 100 mL IVPB     2 g 200 mL/hr over 30 Minutes Intravenous  Once 07/05/19 0333 07/05/19 1001   07/05/19 0345  vancomycin (VANCOCIN) 1,500 mg in sodium chloride 0.9 % 500 mL IVPB     1,500 mg 250 mL/hr over 120 Minutes Intravenous  Once 07/05/19 0336 07/05/19 1001        Objective:   Vitals:   07/09/19 0837 07/09/19 1000 07/09/19 1211 07/09/19 1223  BP:  139/79  131/77  Pulse: (!) 107 (!) 109  (!) 101  Resp: 18 20 (!) 24 (!) 21  Temp:  98.3 F (36.8 C)  98 F (36.7 C)  TempSrc:  Oral  Oral  SpO2:  95% 96% 96%  Weight:      Height:        Wt Readings from Last 3 Encounters:  07/08/19 81.9 kg  06/21/19 75 kg     Intake/Output Summary (Last 24 hours) at 07/09/2019 1421 Last data filed at 07/09/2019 0600 Gross per 24 hour  Intake 583.34 ml  Output --  Net 583.34 ml   Physical exam Fatigue, in some distress with pain HEENT: Dry mucosa, supple neck Chest: Clear CVS: S1-S2 tachycardic, no murmurs GI: Soft, nondistended, epigastric tenderness to pressure, bowel sounds present Musculoskeletal: Warm, no edema     Data Review:    CBC Recent Labs  Lab 07/05/19 0420 07/06/19 0212 07/07/19 0221 07/08/19 0403 07/09/19 0420  WBC 28.4* 25.4* 20.0* 21.9* 20.4*  HGB 13.3 11.7* 10.4* 10.3* 9.8*  HCT 42.5 38.5* 34.2* 33.3* 31.6*  PLT 1,238* 905* 702* 662* 564*  MCV 87.8 88.9 87.7 86.7 87.5  MCH 27.5 27.0 26.7 26.8 27.1  MCHC 31.3 30.4 30.4 30.9 31.0  RDW 13.4 13.2 13.7 13.8 13.8  LYMPHSABS  --   --   --   --  2.1  MONOABS  --   --   --    --  1.5*  EOSABS  --   --   --   --  0.1  BASOSABS  --   --   --   --  0.0    Chemistries  Recent Labs  Lab 07/05/19 0420 07/06/19 0212 07/07/19 0221 07/08/19 0403 07/09/19 0420  NA 131* 133* 132* 130* 128*  K 3.8 4.4 4.1 4.0 3.7  CL 91* 98 96* 94* 93*  CO2 27 24 27 27 27   GLUCOSE 165* 122* 108* 106* 94  BUN 9 14 12 12 10   CREATININE 0.88 0.77 0.63 0.51* 0.47*  CALCIUM 9.1 8.7* 8.6* 8.3* 8.0*  MG  --   --   --  1.8  --   AST 24 14* 14* 25  --   ALT 33 20 16 20   --   ALKPHOS 146* 102 107 139*  --   BILITOT 0.4 0.5 0.7 0.8  --    ------------------------------------------------------------------------------------------------------------------ No results for input(s): CHOL, HDL, LDLCALC, TRIG, CHOLHDL, LDLDIRECT in the last 72 hours.  No results found for: HGBA1C ------------------------------------------------------------------------------------------------------------------ No results for input(s): TSH, T4TOTAL, T3FREE, THYROIDAB in the last 72 hours.  Invalid input(s): FREET3 ------------------------------------------------------------------------------------------------------------------ No results for input(s): VITAMINB12, FOLATE, FERRITIN, TIBC, IRON, RETICCTPCT in the last 72 hours.  Coagulation profile Recent Labs  Lab 07/05/19 0420  INR 1.2    No results for input(s): DDIMER in the last 72 hours.  Cardiac Enzymes No results for input(s): CKMB, TROPONINI, MYOGLOBIN in the last 168 hours.  Invalid input(s): CK ------------------------------------------------------------------------------------------------------------------    Component Value Date/Time   BNP 98.7 07/05/2019 0420    Inpatient Medications  Scheduled Meds:  Chlorhexidine Gluconate Cloth  6 each Topical Q0600   enoxaparin (LOVENOX) injection  40 mg Subcutaneous Q24H   feeding supplement (PRO-STAT SUGAR FREE 64)  30 mL Per Tube BID   folic acid  1 mg Oral Daily   free water  100 mL  Per Tube Q4H   guaiFENesin  600 mg Oral BID   lip balm  1 application Topical BID   metoprolol tartrate  25 mg Oral BID   morphine   Intravenous Q4H   multivitamin  15 mL Per Tube Daily   sodium chloride flush  10-40 mL Intracatheter Q12H   thiamine  100 mg Oral Daily   Or   thiamine  100 mg Intravenous Daily   Continuous Infusions:  sodium chloride 250 mL (07/07/19 2035)   famotidine (PEPCID) IV 20 mg (07/09/19 1237)   feeding supplement (OSMOLITE 1.5 CAL)     meropenem (MERREM) IV Stopped (07/09/19 0625)   PRN Meds:.sodium chloride, acetaminophen **OR** acetaminophen, diphenhydrAMINE **OR** diphenhydrAMINE, LORazepam **OR** LORazepam, magic mouthwash, menthol-cetylpyridinium, naloxone **AND** sodium chloride flush, ondansetron (ZOFRAN) IV, phenol, polyethylene glycol, sodium chloride flush  Micro Results Recent Results (from the past 240 hour(s))  Urine culture     Status: None   Collection Time: 07/05/19  2:46 AM   Specimen: In/Out  Cath Urine  Result Value Ref Range Status   Specimen Description   Final    IN/OUT CATH URINE Performed at [facility], [address], [place], [place] [address]    Special Requests   Final    NONE Performed at [facility], [address], [place], [place] [address]    Culture   Final    NO GROWTH Performed at [facility], [address], [place], [place] [address]    Report Status 07/06/2019 FINAL  Final  Blood Culture (routine x 2)     Status: None (Preliminary result)   Collection Time: 07/05/19  3:33 AM   Specimen: BLOOD  Result Value Ref Range Status   Specimen Description   Final    BLOOD RIGHT ARM Performed at [facility], [address], [place], [place] [address]    Special Requests   Final    BOTTLES DRAWN AEROBIC ONLY Blood Culture results may not be optimal due to an inadequate volume of blood received in culture bottles Performed at [facility], [address] [place], [place] [address]    Culture   Final    NO GROWTH 4 DAYS Performed at [facility], [address], [place], [place] [address]    Report Status PENDING  Incomplete  SARS Coronavirus 2 [facility] order, Performed in [facility] hospital lab) Nasopharyngeal Nasopharyngeal Swab     Status: None   Collection Time: 07/05/19  3:34 AM   Specimen: Nasopharyngeal Swab  Result Value Ref Range Status   SARS Coronavirus 2 NEGATIVE NEGATIVE Final    Comment: (NOTE) If result is NEGATIVE SARS-CoV-2 target nucleic acids are NOT DETECTED. The SARS-CoV-2 RNA is generally detectable in upper and lower  respiratory specimens during the acute phase of infection. The lowest  concentration of SARS-CoV-2 viral copies this assay can detect is 250  copies / mL. A negative result does not preclude SARS-CoV-2 infection  and should not be used as the sole basis for treatment or other  patient management decisions.  A negative result may occur with  improper specimen collection / handling, submission of specimen other  than nasopharyngeal swab, presence of viral mutation(s) within the  areas targeted by this assay, and inadequate number of viral copies  (<250 copies / mL). A negative result must be combined with clinical  observations, patient history, and epidemiological information. If result is POSITIVE SARS-CoV-2 target nucleic acids are DETECTED. The SARS-CoV-2 RNA is generally detectable in upper and lower  respiratory specimens dur ing the acute phase of infection.  Positive  results are indicative of active infection with SARS-CoV-2.  Clinical  correlation with patient history and other diagnostic information is  necessary to determine patient infection status.  Positive results do  not rule out bacterial infection or co-infection with other viruses. If result is PRESUMPTIVE POSTIVE SARS-CoV-2 nucleic acids MAY BE PRESENT.   A presumptive positive  result was obtained on the submitted specimen  and confirmed on repeat testing.  While 2019 novel coronavirus  (SARS-CoV-2) nucleic acids may be present in the submitted sample  additional confirmatory testing may be necessary for epidemiological  and / or clinical management purposes  to differentiate between  SARS-CoV-2 and other Sarbecovirus currently known to infect humans.  If clinically indicated additional testing with an alternate test  methodology [phone]) is advised. The SARS-CoV-2 RNA is generally  detectable in upper and lower respiratory sp ecimens during  the acute  phase of infection. The expected result is Negative. Fact Sheet for Patients:  BoilerBrush.com.cy Fact Sheet for Healthcare Providers: https://pope.com/ This test is not yet approved or cleared by the [place] FDA and has been authorized for detection and/or diagnosis of SARS-CoV-2 by FDA under an Emergency Use Authorization (EUA).  This EUA will remain in effect (meaning this test can be used) for the duration of the COVID-19 declaration under Section 564(b)(1) of the Act, 21 U.S.C. section 360bbb-3(b)(1), unless the authorization is terminated or revoked sooner. Performed at [facility], [address] [place], [place] [address]   Blood Culture (routine x 2)     Status: None (Preliminary result)   Collection Time: 07/05/19  7:00 AM   Specimen: BLOOD  Result Value Ref Range Status   Specimen Description   Final    BLOOD LEFT Performed at [facility], [address] [place], [place] [address]    Special Requests   Final    BOTTLES DRAWN AEROBIC AND ANAEROBIC Blood Culture adequate volume Performed at [facility], [address], [place], [place] [address]    Culture   Final    NO GROWTH 4 DAYS Performed at [facility], [address], [place], [place] [address]    Report Status  PENDING  Incomplete  Gram stain     Status: None   Collection Time: 07/05/19  4:15 PM   Specimen: Fluid  Result Value Ref Range Status   Specimen Description FLUID  Final   Special Requests NONE  Final   Gram Stain   Final    FEW WBC PRESENT, PREDOMINANTLY MONONUCLEAR NO ORGANISMS SEEN Performed at [facility], [address], [place], [place] [address]    Report Status 07/06/2019 FINAL  Final  Culture, body fluid-bottle     Status: None (Preliminary result)   Collection Time: 07/05/19  4:15 PM   Specimen: Fluid  Result Value Ref Range Status   Specimen Description FLUID  Final   Special Requests NONE  Final   Culture   Final    NO GROWTH 4 DAYS Performed at [facility], [address] [place], [place] [address]    Report Status PENDING  Incomplete  MRSA PCR Screening     Status: None   Collection Time: 07/06/19  3:30 AM   Specimen: Nasal Mucosa; Nasopharyngeal  Result Value Ref Range Status   MRSA by PCR NEGATIVE NEGATIVE Final    Comment:        The GeneXpert MRSA Assay (FDA approved for NASAL specimens only), is one component of a comprehensive MRSA colonization surveillance program. It is not intended to diagnose MRSA infection nor to guide or monitor treatment for MRSA infections. Performed at [facility], [address], [place], [place] [address]     Radiology Reports Dg Chest 1 View  Result Date: 06/26/2019 CLINICAL DATA:  Status post thoracentesis. EXAM: CHEST  1 VIEW COMPARISON:  06/24/2019 FINDINGS: Interval near complete evacuation of the left pleural fluid collection. A small residual effusion remains. No evidence of a postprocedural pneumothorax. Left basilar atelectasis is noted. The right lung is clear. No right-sided effusion. IMPRESSION: Interval near complete evacuation of the left pleural fluid collection with left lower lobe atelectasis. No postprocedural pneumothorax. Electronically Signed   By: Rudie Meyer M.D.    On: 06/26/2019 09:40   Dg Abd 1 View  Result Date: 06/21/2019 CLINICAL DATA:  Pt reports extreme abdominal pain across entire abdomen. Reports hx of pancreatitis. Pain with any movements. EXAM: ABDOMEN - 1 VIEW COMPARISON:  None. FINDINGS: The bowel gas pattern is normal. Cholecystectomy clips. No radio-opaque calculi or other significant radiographic abnormality are seen. IMPRESSION: Negative. Electronically Signed   By: Corlis Leak M.D.   On: 06/21/2019 11:04   Ct Chest W Contrast  Result Date: 06/25/2019 CLINICAL DATA:  Shortness of breath, acute on chronic pancreatitis with multiple peripancreatic fluid collections, recurrent epigastric pain EXAM: CT CHEST, ABDOMEN, AND PELVIS WITH CONTRAST TECHNIQUE: Multidetector CT imaging of the chest, abdomen and pelvis was performed following the standard protocol during bolus administration of intravenous contrast. 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 Normal heart size. No pericardial effusion. Mediastinum/Nodes: No enlarged mediastinal, hilar, or axillary lymph nodes. Thyroid gland, trachea, and esophagus demonstrate no significant findings. Lungs/Pleura: There is a large left pleural effusion with associated atelectasis or consolidation, enlarged compared to prior examination. There is a new, trace right pleural effusion. There are scattered subpleural ground-glass pulmonary opacities, most conspicuous in the right upper lobe. Musculoskeletal: No chest wall mass or suspicious bone lesions identified. CT ABDOMEN PELVIS FINDINGS Hepatobiliary: No focal liver abnormality is seen. Status post cholecystectomy. No biliary dilatation. Pancreas: Redemonstrated extensive retroperitoneal inflammation and multiple peripancreatic fluid collections. There is a new discrete fluid  collection or component adjacent to the greater curvature of the stomach and spleen measuring approximately 6.2 x 2.7 cm (series 3, image 52). There is an additional new fluid collection within or adjacent to the omentum measuring 4.7 x 2.5 cm (series 3, image 75). There is an additional new fluid collection within the small bowel mesentery measuring 5.0 x 3.2 cm (series 3, image 86). Other fluid collections are not significantly changed, for example anterior to the pancreatic neck (series 3, image 67), adjacent to the porta hepatis (series 3, image 67), adjacent to the left kidney and splenic flexure (series 3, image 73), and posterior to the left kidney (series 3, image 65). Spleen: Splenomegaly, maximum span 14.4 cm. Adrenals/Urinary Tract: Adrenal glands are unremarkable. Kidneys are normal, without renal calculi, solid lesion, or hydronephrosis. Bladder is unremarkable. Stomach/Bowel: Stomach is within normal limits. Appendix appears normal. Inflammatory thickening of the transverse colon (series 3, image 81), similar to prior examination, and of the mid small bowel, particularly a segment in the anterior abdomen (series 3, image 95). Vascular/Lymphatic: Aortic atherosclerosis. No enlarged abdominal or pelvic lymph nodes. Reproductive: No mass or other abnormality. Other: Extensive anasarca. Small volume ascites, similar to prior examination. Musculoskeletal: No acute or significant osseous findings. IMPRESSION: 1. There is a large left pleural effusion with associated atelectasis or consolidation, enlarged compared to prior examination. There is a new, trace right pleural effusion. 2. There are scattered subpleural ground-glass pulmonary opacities, most conspicuous in the right upper lobe. These are nonspecific and infectious or inflammatory. 3. Redemonstrated extensive retroperitoneal inflammation and multiple peripancreatic fluid collections. There is a new discrete fluid collection or component adjacent to  the greater curvature of the stomach and spleen measuring approximately 6.2 x 2.7 cm (series 3, image 52). There is an additional new fluid collection within or adjacent to the omentum measuring 4.7 x 2.5 cm (series 3, image 75). There is an additional new fluid collection within the small bowel mesentery measuring 5.0 x 3.2 cm (series 3, image  86). 4. Other fluid collections are not significantly changed, for example anterior to the pancreatic neck (series 3, image 67), adjacent to the porta hepatis (series 3, image 67), adjacent to the left kidney and splenic flexure (series 3, image 73), and posterior to the left kidney (series 3, image 65). 5. Inflammatory thickening of the transverse colon (series 3, image 81), similar to prior examination, and of the mid small bowel, particularly a segment in the anterior abdomen (series 3, image 95). 6. Extensive anasarca. Small volume ascites, similar to prior examination. Electronically Signed   By: Lauralyn Primes M.D.   On: 06/25/2019 14:18   Ct Angio Chest Pe W/cm &/or Wo Cm  Result Date: 07/05/2019 CLINICAL DATA:  Chest pain.  Complicated pancreatitis. EXAM: CT ANGIOGRAPHY CHEST WITH CONTRAST TECHNIQUE: Multidetector CT imaging of the chest was performed using the standard protocol during bolus administration of intravenous contrast. Multiplanar CT image reconstructions and MIPs were obtained to evaluate the vascular anatomy. CONTRAST:  OMNIPAQUE IOHEXOL 350 MG/ML SOLN COMPARISON:  Chest CT from 10 days ago FINDINGS: Cardiovascular: Satisfactory opacification of the pulmonary arteries to the segmental level. No evidence of pulmonary embolism when accounting for intermittent motion artifact. Normal heart size. No pericardial effusion. Aberrant right subclavian artery Mediastinum/Nodes: Negative for adenopathy or mass. Lungs/Pleura: Large left pleural effusion that is layering. Trace right pleural fluid. Near complete collapse of the left lower lobe. No  consolidation or edema. Upper Abdomen: As reported separately. Musculoskeletal: No acute or aggressive finding Review of the MIP images confirms the above findings. IMPRESSION: 1. Negative for pulmonary embolism. 2. Large layering left pleural effusion with near complete lower lobe collapse. Electronically Signed   By: Marnee Spring M.D.   On: 07/05/2019 07:16   Ct Abdomen Pelvis W Contrast  Result Date: 06/25/2019 CLINICAL DATA:  Shortness of breath, acute on chronic pancreatitis with multiple peripancreatic fluid collections, recurrent epigastric pain EXAM: CT CHEST, ABDOMEN, AND PELVIS WITH CONTRAST TECHNIQUE: Multidetector CT imaging of the chest, abdomen and pelvis was performed following the standard protocol during bolus administration of intravenous contrast. 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 Normal heart size. No pericardial effusion. Mediastinum/Nodes: No enlarged mediastinal, hilar, or axillary lymph nodes. Thyroid gland, trachea, and esophagus demonstrate no significant findings. Lungs/Pleura: There is a large left pleural effusion with associated atelectasis or consolidation, enlarged compared to prior examination. There is a new, trace right pleural effusion. There are scattered subpleural ground-glass pulmonary opacities, most conspicuous in the right upper lobe. Musculoskeletal: No chest wall mass or suspicious bone lesions identified. CT ABDOMEN PELVIS FINDINGS Hepatobiliary: No focal liver abnormality is seen. Status post cholecystectomy. No biliary dilatation. Pancreas: Redemonstrated extensive retroperitoneal inflammation and multiple peripancreatic fluid collections. There is a new discrete fluid collection or component adjacent to the greater curvature of the stomach and spleen measuring  approximately 6.2 x 2.7 cm (series 3, image 52). There is an additional new fluid collection within or adjacent to the omentum measuring 4.7 x 2.5 cm (series 3, image 75). There is an additional new fluid collection within the small bowel mesentery measuring 5.0 x 3.2 cm (series 3, image 86). Other fluid collections are not significantly changed, for example anterior to the pancreatic neck (series 3, image 67), adjacent to the porta hepatis (series 3, image 67), adjacent to the left kidney and splenic flexure (series 3,  image 73), and posterior to the left kidney (series 3, image 65). Spleen: Splenomegaly, maximum span 14.4 cm. Adrenals/Urinary Tract: Adrenal glands are unremarkable. Kidneys are normal, without renal calculi, solid lesion, or hydronephrosis. Bladder is unremarkable. Stomach/Bowel: Stomach is within normal limits. Appendix appears normal. Inflammatory thickening of the transverse colon (series 3, image 81), similar to prior examination, and of the mid small bowel, particularly a segment in the anterior abdomen (series 3, image 95). Vascular/Lymphatic: Aortic atherosclerosis. No enlarged abdominal or pelvic lymph nodes. Reproductive: No mass or other abnormality. Other: Extensive anasarca. Small volume ascites, similar to prior examination. Musculoskeletal: No acute or significant osseous findings. IMPRESSION: 1. There is a large left pleural effusion with associated atelectasis or consolidation, enlarged compared to prior examination. There is a new, trace right pleural effusion. 2. There are scattered subpleural ground-glass pulmonary opacities, most conspicuous in the right upper lobe. These are nonspecific and infectious or inflammatory. 3. Redemonstrated extensive retroperitoneal inflammation and multiple peripancreatic fluid collections. There is a new discrete fluid collection or component adjacent to the greater curvature of the stomach and spleen measuring approximately 6.2 x 2.7 cm (series  3, image 52). There is an additional new fluid collection within or adjacent to the omentum measuring 4.7 x 2.5 cm (series 3, image 75). There is an additional new fluid collection within the small bowel mesentery measuring 5.0 x 3.2 cm (series 3, image 86). 4. Other fluid collections are not significantly changed, for example anterior to the pancreatic neck (series 3, image 67), adjacent to the porta hepatis (series 3, image 67), adjacent to the left kidney and splenic flexure (series 3, image 73), and posterior to the left kidney (series 3, image 65). 5. Inflammatory thickening of the transverse colon (series 3, image 81), similar to prior examination, and of the mid small bowel, particularly a segment in the anterior abdomen (series 3, image 95). 6. Extensive anasarca. Small volume ascites, similar to prior examination. Electronically Signed   By: Lauralyn Primes M.D.   On: 06/25/2019 14:18   Ct Abdomen Pelvis W Contrast  Result Date: 06/20/2019 CLINICAL DATA:  Abdominal pain.  Neutropenia. EXAM: CT ABDOMEN AND PELVIS WITH CONTRAST TECHNIQUE: Multidetector CT imaging of the abdomen and pelvis was performed using the standard protocol following bolus administration of intravenous contrast. CONTRAST:  OMNIPAQUE IOHEXOL 300 MG/ML  SOLN COMPARISON:  None. FINDINGS: Lower chest: There is a moderate-sized left-sided pleural effusion with near complete collapse of the left lower lobe.The heart size is normal. Hepatobiliary: The liver is normal. Status post cholecystectomy.There is no biliary ductal dilation. Pancreas: There are multiple peripancreatic fluid collections the largest of which measures approximately 3.7 by 1.8 cm. The pancreas appears to enhance symmetrically. Spleen: The spleen is enlarged measuring approximately 14 cm craniocaudad. Adrenals/Urinary Tract: --Adrenal glands: No adrenal hemorrhage. --Right kidney/ureter: No hydronephrosis or perinephric hematoma. --Left kidney/ureter: There is no  left-sided hydronephrosis. There is a complex collection in the left posterior pararenal space measuring approximately 8.5 x 2.5 cm. --Urinary bladder: Unremarkable. Stomach/Bowel: --Stomach/Duodenum: There is some wall thickening of the stomach. --Small bowel: No dilatation or inflammation. --Colon: There are soft tissue densities along the descending colon measuring approximately 2.8 x 2.4 cm. There is a collection of the splenic flexure measuring 4.7 x 2.7 cm causing mass effect on the nearby:Marland Kitchen There is wall thickening of the transverse colon without evidence of an obstruction. --Appendix: Not visualized. No right lower quadrant inflammation or free fluid. Vascular/Lymphatic: Atherosclerotic changes are noted of the abdominal  aorta without evidence of an abdominal aortic aneurysm. The portal vein and splenic vein remain patent. The splenic artery remains patent. --No retroperitoneal lymphadenopathy. --No mesenteric lymphadenopathy. --No pelvic or inguinal lymphadenopathy. Reproductive: Unremarkable Other: There is a small volume of free fluid in the pelvis. There are multiple scattered collections in the retroperitoneum and peritoneal cavity. A few these collections demonstrate mild rim enhancement. For example in the left upper quadrant there is a 3.9 x 1.9 cm collection that demonstrates mild peripheral rim enhancement. There is a 3.8 by 4.4 cm collection in the region of the gallbladder fossa. Given the surgical clips in the gallbladder fossa this is favored to represent a loculated fluid collection as opposed to a remnant gallbladder. Musculoskeletal. No acute displaced fractures. IMPRESSION: 1. Overall findings concerning for pancreatitis with multiple loculated fluid collections as detailed above. Some of the smaller collections, for example in the left upper quadrant, demonstrate rim enhancement. An abscess is not excluded. There is no CT evidence for pancreatic necrosis. 2. Moderate-sized left-sided  pleural effusion with at least partial collapse of the left lower lobe. 3. Diffuse wall thickening of the transverse colon and splenic flexure favored to be reactive. Other considerations include infectious or inflammatory colitis. 4. Splenomegaly.  The splenic vein remains patent. Electronically Signed   By: Katherine Mantle M.D.   On: 06/20/2019 21:07   Dg Chest Port 1 View  Result Date: 07/05/2019 CLINICAL DATA:  Status post left-sided thoracentesis EXAM: PORTABLE CHEST 1 VIEW COMPARISON:  July 05, 2019 FINDINGS: The left-sided pleural effusion has significantly decreased in size from the prior study. There is no evidence for left-sided pneumothorax. Bibasilar airspace opacities are noted, left worse than right. The lung volumes are low. The heart size is stable from prior study. IMPRESSION: 1. No pneumothorax status post left-sided thoracentesis. 2. Significant interval decrease in size of the left-sided pleural effusion. 3. Bibasilar airspace opacities, left greater than right, favored to represent atelectasis. 4. Low lung volumes. Electronically Signed   By: Katherine Mantle M.D.   On: 07/05/2019 16:31   Dg Chest Portable 1 View  Result Date: 07/05/2019 CLINICAL DATA:  Chest pain. EXAM: PORTABLE CHEST 1 VIEW COMPARISON:  Radiograph 06/26/2019, CT 06/25/2019 FINDINGS: Re-accumulation of left pleural effusion after thoracentesis. Pleural effusion is moderate in size with associated underlying left lung base atelectasis/consolidation. Possible small right pleural effusion. Unchanged heart size and mediastinal contours. Increasing patchy right lung base opacity. No pneumothorax. IMPRESSION: 1. Re-accumulation of left pleural effusion after thoracentesis, now moderate in size. Associated left lung base atelectasis/consolidation. 2. Possible small right pleural effusion. Increasing right lung base opacity may be atelectasis or pneumonia. Electronically Signed   By: Narda Rutherford M.D.   On:  07/05/2019 03:22   Dg Chest Port 1 View  Result Date: 06/24/2019 CLINICAL DATA:  Chest pain for 1 hour EXAM: PORTABLE CHEST 1 VIEW COMPARISON:  06/20/2019 FINDINGS: Left-sided pleural effusion is again seen and slightly enlarged when compare with the prior study. Likely underlying atelectasis/infiltrate is present in the left base. The right lung is clear. No bony abnormality is noted. IMPRESSION: Increasing left-sided pleural effusion. Electronically Signed   By: Alcide Clever M.D.   On: 06/24/2019 19:33   Dg Chest Portable 1 View  Result Date: 06/20/2019 CLINICAL DATA:  Chest pain EXAM: PORTABLE CHEST 1 VIEW COMPARISON:  None. FINDINGS: There is airspace consolidation in the left base with left pleural effusion. Lungs elsewhere are clear. Heart size and pulmonary vascularity are normal. No adenopathy. No  bone lesions. IMPRESSION: Left lower lobe airspace consolidation consistent with pneumonia. Small left pleural effusion. Lungs elsewhere clear. No adenopathy. Heart size normal. Followup PA and lateral chest radiographs recommended in 3-4 weeks following trial of antibiotic therapy to ensure resolution and exclude underlying malignancy. Electronically Signed   By: Bretta Bang III M.D.   On: 06/20/2019 19:32   Ct Renal Stone Study  Result Date: 07/05/2019 CLINICAL DATA:  Chest pain. EXAM: CT ABDOMEN AND PELVIS WITHOUT CONTRAST TECHNIQUE: Multidetector CT imaging of the abdomen and pelvis was performed following the standard protocol without IV contrast. COMPARISON:  Fifteen days ago FINDINGS: Lower chest: At least moderate left pleural effusion with multi segment atelectasis. Pending chest CT. Hepatobiliary: No focal liver abnormality.Cholecystectomy. Pancreas: History of recurrent pancreatitis. Progressive organized collections: 1. 8 x 4 cm along the upper greater curvature of the stomach 2. 6 cm along the mid greater curvature of the stomach-which may be connected to #1. 3. Peripancreatic to  splenic tail, 15 x 4 by up to 6 cm-with further extent tracking inferiorly along the descending colon. 4. Ligamentum venosum of the liver measuring 5 x 3 cm 5. Ventral midline abdomen at 5 cm 6. Lower ventral midline abdomen with interloop distortion, 9 cm. These collections exert mass effect on the stomach, transverse colon, and descending colon. Fat density with rim of stranding posterior to the left kidney in the posterior pararenal space, stable in compatible with fat necrosis. There is generalized retroperitoneal edema about the collections and pancreas. No gas within any of the collections to suggest fistula or definite superinfection. Spleen: Stable size and density. Adrenals/Urinary Tract: Negative adrenals. No hydronephrosis or stone. Unremarkable bladder. Stomach/Bowel: No obstruction. Low-density about the hepatic flexure is likely a combination of reactive wall thickening and pericolonic fluid. Vascular/Lymphatic: No visible acute vascular abnormality. No noted adenopathy. Reproductive:Negative Other: Right upper quadrant ascites which may be loculated. Musculoskeletal: No acute abnormalities. IMPRESSION: 1. Recent pancreatitis with multiple organized fluid collections in the abdomen as listed and measured above. The largest measures 15 x 4 x 6 cm. Collections exert mass effect on the stomach, transverse colon, descending colon without obstruction. 2. Stable left posterior pararenal fat necrosis. 3. At least moderate left pleural effusion with multi segment atelectasis. Electronically Signed   By: Marnee Spring M.D.   On: 07/05/2019 07:12   Ir Thoracentesis Asp Pleural Space W/img Guide  Result Date: 06/26/2019 INDICATION: Shortness of breath. Left-sided pleural effusion. Request for diagnostic and therapeutic thoracentesis. EXAM: ULTRASOUND GUIDED LEFT THORACENTESIS MEDICATIONS: None. COMPLICATIONS: None immediate. PROCEDURE: An ultrasound guided thoracentesis was thoroughly discussed with the  patient and questions answered. The benefits, risks, alternatives and complications were also discussed. The patient understands and wishes to proceed with the procedure. Written consent was obtained. Ultrasound was performed to localize and mark an adequate pocket of fluid in the left chest. The area was then prepped and draped in the normal sterile fashion. 1% Lidocaine was used for local anesthesia. Under ultrasound guidance a 6 Fr Safe-T-Centesis catheter was introduced. Thoracentesis was performed. The catheter was removed and a dressing applied. FINDINGS: A total of approximately 1.3 L of hazy yellow fluid was removed. Samples were sent to the laboratory as requested by the clinical team. IMPRESSION: Successful ultrasound guided left thoracentesis yielding 1.3 L of pleural fluid. Read by: Brayton El PA-C Electronically Signed   By: Richarda Overlie M.D.   On: 06/26/2019 09:19   US Thoracentesis Asp Pleural Space W/img Guide  Result Date: 07/05/2019 INDICATION: Patient  with history of recurrent pancreatitis, GERD, dyspnea, recurrent left pleural effusion. Request made for diagnostic and therapeutic left thoracentesis. EXAM: ULTRASOUND GUIDED DIAGNOSTIC AND THERAPEUTIC LEFT THORACENTESIS MEDICATIONS: None COMPLICATIONS: None immediate. PROCEDURE: An ultrasound guided thoracentesis was thoroughly discussed with the patient and questions answered. The benefits, risks, alternatives and complications were also discussed. The patient understands and wishes to proceed with the procedure. Written consent was obtained. Ultrasound was performed to localize and mark an adequate pocket of fluid in the left chest. The area was then prepped and draped in the normal sterile fashion. 1% Lidocaine was used for local anesthesia. Under ultrasound guidance a 6 Fr Safe-T-Centesis catheter was introduced. Thoracentesis was performed. The catheter was removed and a dressing applied. FINDINGS: A total of approximately 770 cc of  yellow fluid was removed. Samples were sent to the laboratory as requested by the clinical team. IMPRESSION: Successful ultrasound guided diagnostic and therapeutic left thoracentesis yielding 770 cc of pleural fluid. Read by: Jeananne Rama, PA-C Electronically Signed   By: Richarda Overlie M.D.   On: 07/05/2019 16:12    Time Spent in minutes  35   Avangeline Stockburger M.D on 07/09/2019 at 2:21 PM  Between 7am to 7pm - Pager - [phone]  After 7pm go to [email or web address] - password [facility]  [facility] -  Office  [phone]

## 2019-07-09 NOTE — Progress Notes (Signed)
Coretrak ordered.  Patient states he wants to wait until later this afternoon for Coretrak to be placed.  Dr. Clementeen Graham aware.

## 2019-07-09 NOTE — Progress Notes (Signed)
Dr. Clementeen Graham on unit and notified of midline to be place later today by IV team.  PCA to be restarted when midline is placed per Dr. Clementeen Graham.  PRN orders to follow.

## 2019-07-10 ENCOUNTER — Inpatient Hospital Stay (HOSPITAL_COMMUNITY): Payer: Self-pay

## 2019-07-10 DIAGNOSIS — R6510 Systemic inflammatory response syndrome (SIRS) of non-infectious origin without acute organ dysfunction: Secondary | ICD-10-CM

## 2019-07-10 DIAGNOSIS — Z4659 Encounter for fitting and adjustment of other gastrointestinal appliance and device: Secondary | ICD-10-CM

## 2019-07-10 LAB — CBC
HCT: 34.6 % — ABNORMAL LOW (ref 39.0–52.0)
Hemoglobin: 10.8 g/dL — ABNORMAL LOW (ref 13.0–17.0)
MCH: 27.1 pg (ref 26.0–34.0)
MCHC: 31.2 g/dL (ref 30.0–36.0)
MCV: 86.7 fL (ref 80.0–100.0)
Platelets: 676 10*3/uL — ABNORMAL HIGH (ref 150–400)
RBC: 3.99 MIL/uL — ABNORMAL LOW (ref 4.22–5.81)
RDW: 14.1 % (ref 11.5–15.5)
WBC: 28 10*3/uL — ABNORMAL HIGH (ref 4.0–10.5)
nRBC: 0 % (ref 0.0–0.2)

## 2019-07-10 LAB — CBC WITH DIFFERENTIAL/PLATELET
Abs Immature Granulocytes: 0.27 10*3/uL — ABNORMAL HIGH (ref 0.00–0.07)
Basophils Absolute: 0.1 10*3/uL (ref 0.0–0.1)
Basophils Relative: 0 %
Eosinophils Absolute: 0.1 10*3/uL (ref 0.0–0.5)
Eosinophils Relative: 0 %
HCT: 33.6 % — ABNORMAL LOW (ref 39.0–52.0)
Hemoglobin: 10.5 g/dL — ABNORMAL LOW (ref 13.0–17.0)
Immature Granulocytes: 1 %
Lymphocytes Relative: 8 %
Lymphs Abs: 2.5 10*3/uL (ref 0.7–4.0)
MCH: 27.4 pg (ref 26.0–34.0)
MCHC: 31.3 g/dL (ref 30.0–36.0)
MCV: 87.7 fL (ref 80.0–100.0)
Monocytes Absolute: 2.3 10*3/uL — ABNORMAL HIGH (ref 0.1–1.0)
Monocytes Relative: 7 %
Neutro Abs: 27.1 10*3/uL — ABNORMAL HIGH (ref 1.7–7.7)
Neutrophils Relative %: 84 %
Platelets: 663 10*3/uL — ABNORMAL HIGH (ref 150–400)
RBC: 3.83 MIL/uL — ABNORMAL LOW (ref 4.22–5.81)
RDW: 14.2 % (ref 11.5–15.5)
WBC: 32.2 10*3/uL — ABNORMAL HIGH (ref 4.0–10.5)
nRBC: 0 % (ref 0.0–0.2)

## 2019-07-10 LAB — CULTURE, BLOOD (ROUTINE X 2)
Culture: NO GROWTH
Culture: NO GROWTH
Special Requests: ADEQUATE

## 2019-07-10 LAB — PHOSPHORUS
Phosphorus: 3.3 mg/dL (ref 2.5–4.6)
Phosphorus: 3 mg/dL (ref 2.5–4.6)

## 2019-07-10 LAB — CULTURE, BODY FLUID W GRAM STAIN -BOTTLE: Culture: NO GROWTH

## 2019-07-10 LAB — BASIC METABOLIC PANEL
Anion gap: 9 (ref 5–15)
BUN: 10 mg/dL (ref 6–20)
CO2: 29 mmol/L (ref 22–32)
Calcium: 8.3 mg/dL — ABNORMAL LOW (ref 8.9–10.3)
Chloride: 93 mmol/L — ABNORMAL LOW (ref 98–111)
Creatinine, Ser: 0.49 mg/dL — ABNORMAL LOW (ref 0.61–1.24)
GFR calc Af Amer: >60 mL/min (ref >60)
GFR calc non Af Amer: >60 mL/min (ref >60)
Glucose, Bld: 124 mg/dL — ABNORMAL HIGH (ref 70–99)
Potassium: 3.9 mmol/L (ref 3.5–5.1)
Sodium: 131 mmol/L — ABNORMAL LOW (ref 135–145)

## 2019-07-10 LAB — MAGNESIUM
Magnesium: 2 mg/dL (ref 1.7–2.4)
Magnesium: 2 mg/dL (ref 1.7–2.4)

## 2019-07-10 LAB — GLUCOSE, CAPILLARY
Glucose-Capillary: 109 mg/dL — ABNORMAL HIGH (ref 70–99)
Glucose-Capillary: 117 mg/dL — ABNORMAL HIGH (ref 70–99)
Glucose-Capillary: 120 mg/dL — ABNORMAL HIGH (ref 70–99)
Glucose-Capillary: 122 mg/dL — ABNORMAL HIGH (ref 70–99)
Glucose-Capillary: 123 mg/dL — ABNORMAL HIGH (ref 70–99)
Glucose-Capillary: 133 mg/dL — ABNORMAL HIGH (ref 70–99)

## 2019-07-10 MED ORDER — IOHEXOL 300 MG/ML  SOLN
30.0000 mL | Freq: Once | INTRAMUSCULAR | Status: AC | PRN
  Administered 2019-07-10: 30 mL via ORAL

## 2019-07-10 MED ORDER — SODIUM CHLORIDE 0.9 % IV SOLN
INTRAVENOUS | Status: AC
  Filled 2019-07-10: qty 250

## 2019-07-10 MED ORDER — SODIUM CHLORIDE (PF) 0.9 % IJ SOLN
INTRAMUSCULAR | Status: AC
  Filled 2019-07-10: qty 50

## 2019-07-10 MED ORDER — IOHEXOL 300 MG/ML  SOLN
100.0000 mL | Freq: Once | INTRAMUSCULAR | Status: AC | PRN
  Administered 2019-07-10: 100 mL via INTRAVENOUS

## 2019-07-10 MED FILL — Sodium Chloride IV Soln 0.9%: INTRAVENOUS | Qty: 20 | Status: AC

## 2019-07-10 MED FILL — Morphine Sulfate IV Soln PF 10 MG/ML: INTRAVENOUS | Qty: 50 | Status: AC

## 2019-07-10 NOTE — Progress Notes (Signed)
This RN advanced the Core track feeding tube to the 70 mark line. Xray obtained and in the distal stomach per radiology. Will restart tube feedings per order.

## 2019-07-10 NOTE — Progress Notes (Signed)
Subjective: New right sided/lower abdominal pain.  He was able to sleep better last evening.  Objective: Vital signs in last 24 hours: Temp:  [98 F (36.7 C)-99.6 F (37.6 C)] 98.3 F (36.8 C) (08/21 0548) Pulse Rate:  [93-115] 115 (08/21 0548) Resp:  [14-24] 18 (08/21 0840) BP: (124-139)/(77-90) 133/90 (08/21 0548) SpO2:  [93 %-98 %] 98 % (08/21 0840) FiO2 (%):  [33 %] 33 % (08/21 0840) Weight:  [78.9 kg] 78.9 kg (08/21 0444) Last BM Date: 07/09/19  Intake/Output from previous day: 08/20 0701 - 08/21 0700 In: 1991.6 [P.O.:480; I.V.:227.9; NG/GT:806; IV Piggyback:477.7] Out: 300 [Urine:300] Intake/Output this shift: No intake/output data recorded.  General appearance: alert and no distress GI: tender in the left and right side of the abdomen.  Lab Results: Recent Labs    07/08/19 0403 07/09/19 0420 07/10/19 0521  WBC 21.9* 20.4* 28.0*  HGB 10.3* 9.8* 10.8*  HCT 33.3* 31.6* 34.6*  PLT 662* 564* 676*   BMET Recent Labs    07/08/19 0403 07/09/19 0420 07/10/19 0521  NA 130* 128* 131*  K 4.0 3.7 3.9  CL 94* 93* 93*  CO2 27 27 29   GLUCOSE 106* 94 124*  BUN 12 10 10   CREATININE 0.51* 0.47* 0.49*  CALCIUM 8.3* 8.0* 8.3*   LFT Recent Labs    07/08/19 0403  PROT 5.8*  ALBUMIN 1.6*  AST 25  ALT 20  ALKPHOS 139*  BILITOT 0.8   PT/INR No results for input(s): LABPROT, INR in the last 72 hours. Hepatitis Panel No results for input(s): HEPBSAG, HCVAB, HEPAIGM, HEPBIGM in the last 72 hours. C-Diff No results for input(s): CDIFFTOX in the last 72 hours. Fecal Lactopherrin No results for input(s): FECLLACTOFRN in the last 72 hours.  Studies/Results: Dg Chest Port 1 View  Result Date: 07/10/2019 CLINICAL DATA:  Leukocytosis EXAM: PORTABLE CHEST 1 VIEW COMPARISON:  None. FINDINGS: Feeding tube tip is in the distal esophagus. There is a small left pleural effusion with patchy consolidation in the left base. There is atelectatic change in each lung base as well.  Heart is upper normal in size with pulmonary vascularity normal. No adenopathy. No bone lesions. IMPRESSION: Feeding tube tip in distal esophagus. Persistent patchy consolidation left base with bibasilar atelectasis. Small left pleural effusion. Stable cardiac silhouette. These results will be called to the ordering clinician or representative by the Radiologist Assistant, and communication documented in the PACS or zVision Dashboard. Electronically Signed   By: Lowella Grip III M.D.   On: 07/10/2019 08:16   Dg Abd Portable 1v  Result Date: 07/09/2019 CLINICAL DATA:  Feeding tube placement EXAM: PORTABLE ABDOMEN - 1 VIEW COMPARISON:  07/05/2019 FINDINGS: Bibasilar pleural effusions and airspace disease. Esophageal tube tip overlies the proximal stomach. Dilated small bowel in the central abdomen. IMPRESSION: Esophageal tube tip overlies the proximal stomach. Electronically Signed   By: Donavan Foil M.D.   On: 07/09/2019 19:32    Medications:  Scheduled: . Chlorhexidine Gluconate Cloth  6 each Topical Q0600  . enoxaparin (LOVENOX) injection  40 mg Subcutaneous Q24H  . feeding supplement (PRO-STAT SUGAR FREE 64)  30 mL Per Tube BID  . folic acid  1 mg Oral Daily  . free water  100 mL Per Tube Q4H  . guaiFENesin  600 mg Oral BID  . lip balm  1 application Topical BID  . metoprolol tartrate  25 mg Oral BID  . morphine   Intravenous Q4H  . multivitamin  15 mL Per Tube Daily  .  sodium chloride flush  10-40 mL Intracatheter Q12H  . thiamine  100 mg Oral Daily   Or  . thiamine  100 mg Intravenous Daily   Continuous: . sodium chloride 250 mL (07/07/19 2035)  . famotidine (PEPCID) IV 20 mg (07/09/19 2202)  . feeding supplement (OSMOLITE 1.5 CAL) 1,000 mL (07/09/19 2134)  . meropenem (MERREM) IV 1 g (07/10/19 0536)    Assessment/Plan: 1) Idiopathic pancreatitis. 2) New right sided abdominal pain. 3) Elevation in WBC. 4) Pseudocysts.   The patient reports the onset of the right  sided/lower abdominal pain this AM.  It occurred when his bed was flattened by his nurse, however, he is noted to have an increase in his WBC from 20 to 28.  There is no fever.  Feeding tube was successful, but it only terminates in the proximal stomach.  He is tolerating the tube feeds without any pain.  Plan: 1) If the new right sided pain worsens, and/or there is a fever or continued increase in the WBC, then a repeat abdominal CT scan will be required to evaluate for an infected pseudocyst. 2) Continue with the tube feeds. 3) Continue with pain medications.  LOS: 5 days   Tajia Szeliga D 07/10/2019, 8:51 AM

## 2019-07-10 NOTE — Progress Notes (Signed)
PROGRESS NOTE                                                                                                                                                                                                             Patient Demographics:    Kyle Mooney, is a 48 y.o. male, DOB - [birth year], ION:[id number]  Admit date - 07/05/2019   Admitting Physician A Grier Mitts., MD  Outpatient Primary MD for the patient is Patient, No Pcp Per  LOS - 5  [facility]  Chief Complaint  Patient presents with   Chest Pain   Abdominal Pain       Brief Narrative    48 year old male with recurrent pancreatitis with developing pseudocyst, IBS and GERD who is followed at [facility] for his idiopathic pancreatitis and pancreatic cyst presented with recurrent epigastric pain suspicious for acute on chronic pancreatitis.  He was last hospitalized at [facility] on 06/02/2019 with necrotizing pancreatitis.  CT angiogram of the chest was negative for PE but showed large layering left pleural effusion with near complete lower lobe collapse.  IR consulted and underwent thoracentesis of the left pleural effusion.  Surgery consulted for evaluation of pseudocyst and recommended to wait for 6 weeks for the pseudocyst to mature and either follow-up with Dr. gross or at [facility] for further evaluation. Given his persistent pain he has been requiring IV morphine PCA.   Subjective:   PICC line placed and morphine PCA resumed.  Complains of some pain in the epigastric area but not worsened.  Core track placed but the tip was found to be in distal esophagus.  Noted worsening leukocytosis.   Assessment  & Plan :    Principal Problem:   Idiopathic acute pancreatitis with uninfected necrosis Being managed with IV fluids, morphine PCA and empiric IV antibiotics GI and surgery following closely.  Surgery recommends ideally  the pseudocyst will need to measure for at least 6 weeks to attempt cystoscopy gastrostomy.   Has very poor nutrition with low albumin.  Core track placed and nutrition resumed (will need to adjust the tube as it is higher up in distal esophagus and moved postpyloric).  Attempting to transfer  patient to [facility] since patient is seen by both GI and surgery there.  Unfortunately I am told they  still do not have a bed until early next week.  Active Problems: Left-sided large pleural effusion Thoracentesis on 8/16 appears exudative.?  Parapneumonic.  Cultures negative.  Stable on 2 L via nasal cannula.  On empiric IV meropenem.  Follow-up chest x-ray still shows persistent patchy infiltrate in the left base with bibasilar atelectasis and small pleural effusion.  Has worsened leukocytosis.  Ordered blood culture. Will obtain CT of the chest to evaluate the infiltrate and effusion further.  Also obtain CT of the abdomen pelvis with contrast to check for infected pseudocyst.  Hyponatremia Possibly prerenal with dehydration.  Improving with IV fluids.  Severe protein calorie malnutrition (HCC) Core track placed and feeding started.  Dietitian following.  Tobacco abuse Counseled on cessation  Alcohol abuse No signs of withdrawal.     Leukocytosis and thrombocytosis Suspected to be reactive with acute pancreatitis however worsened.  Obtain CT of the abdomen to rule out infected pseudocyst.  Blood culture ordered.    Code Status : Full code  Family Communication  : None  Disposition Plan  : Continue inpatient.  Attempting transfer to [facility].  Barriers For Discharge : Active symptoms  Consults  : GI/surgery  Procedures  : CT abdomen pelvis, left thoracentesis  DVT Prophylaxis  :  Lovenox -   Lab Results  Component Value Date   PLT 676 (H) 07/10/2019    Antibiotics  :    Anti-infectives (From admission, onward)   Start     Dose/Rate Route  Frequency Ordered Stop   07/05/19 1800  ceFEPIme (MAXIPIME) 2 g in sodium chloride 0.9 % 100 mL IVPB  Status:  Discontinued     2 g 200 mL/hr over 30 Minutes Intravenous Every 8 hours 07/05/19 0852 07/05/19 1013   07/05/19 1400  meropenem (MERREM) 1 g in sodium chloride 0.9 % 100 mL IVPB     1 g 200 mL/hr over 30 Minutes Intravenous Every 8 hours 07/05/19 1013 07/15/19 1359   07/05/19 1000  metroNIDAZOLE (FLAGYL) IVPB 500 mg  Status:  Discontinued     500 mg 100 mL/hr over 60 Minutes Intravenous Every 8 hours 07/05/19 0833 07/05/19 1013   07/05/19 0345  vancomycin (VANCOCIN) IVPB 1000 mg/200 mL premix  Status:  Discontinued     1,000 mg 200 mL/hr over 60 Minutes Intravenous  Once 07/05/19 0333 07/05/19 0336   07/05/19 0345  ceFEPIme (MAXIPIME) 2 g in sodium chloride 0.9 % 100 mL IVPB     2 g 200 mL/hr over 30 Minutes Intravenous  Once 07/05/19 0333 07/05/19 1001   07/05/19 0345  vancomycin (VANCOCIN) 1,500 mg in sodium chloride 0.9 % 500 mL IVPB     1,500 mg 250 mL/hr over 120 Minutes Intravenous  Once 07/05/19 0336 07/05/19 1001        Objective:   Vitals:   07/10/19 0537 07/10/19 0548 07/10/19 0840 07/10/19 1006  BP:  133/90  120/76  Pulse:  (!) 115  (!) 108  Resp: 18 18 18 19   Temp:  98.3 F (36.8 C)  98.4 F (36.9 C)  TempSrc:  Oral  Oral  SpO2: 93% 98% 98% 96%  Weight:      Height:        Wt Readings from Last 3 Encounters:  07/10/19 78.9 kg  06/21/19 75 kg     Intake/Output Summary (Last 24 hours) at 07/10/2019 1149 Last data filed at 07/10/2019 0600 Gross per 24 hour  Intake 1751.6 ml  Output 300 ml  Net 1451.6 ml   Physical exam Not in distress, fatigue, NG + HEENT: Moist mucosa, supple neck Chest: Diminished breath sounds over left lung base CVS: S1-S2 tachycardic, no murmurs GI: Soft, nondistended, bowel sounds present, epigastric tenderness + Musculoskeletal: Warm, no edema      Data Review:    CBC Recent Labs  Lab 07/06/19 0212  07/07/19 0221 07/08/19 0403 07/09/19 0420 07/10/19 0521  WBC 25.4* 20.0* 21.9* 20.4* 28.0*  HGB 11.7* 10.4* 10.3* 9.8* 10.8*  HCT 38.5* 34.2* 33.3* 31.6* 34.6*  PLT 905* 702* 662* 564* 676*  MCV 88.9 87.7 86.7 87.5 86.7  MCH 27.0 26.7 26.8 27.1 27.1  MCHC 30.4 30.4 30.9 31.0 31.2  RDW 13.2 13.7 13.8 13.8 14.1  LYMPHSABS  --   --   --  2.1  --   MONOABS  --   --   --  1.5*  --   EOSABS  --   --   --  0.1  --   BASOSABS  --   --   --  0.0  --     Chemistries  Recent Labs  Lab 07/05/19 0420 07/06/19 0212 07/07/19 0221 07/08/19 0403 07/09/19 0420 07/09/19 1706 07/10/19 0521  NA 131* 133* 132* 130* 128*  --  131*  K 3.8 4.4 4.1 4.0 3.7  --  3.9  CL 91* 98 96* 94* 93*  --  93*  CO2 27 24 27 27 27   --  29  GLUCOSE 165* 122* 108* 106* 94  --  124*  BUN 9 14 12 12 10   --  10  CREATININE 0.88 0.77 0.63 0.51* 0.47*  --  0.49*  CALCIUM 9.1 8.7* 8.6* 8.3* 8.0*  --  8.3*  MG  --   --   --  1.8  --  2.0 2.0  AST 24 14* 14* 25  --   --   --   ALT 33 20 16 20   --   --   --   ALKPHOS 146* 102 107 139*  --   --   --   BILITOT 0.4 0.5 0.7 0.8  --   --   --    ------------------------------------------------------------------------------------------------------------------ No results for input(s): CHOL, HDL, LDLCALC, TRIG, CHOLHDL, LDLDIRECT in the last 72 hours.  No results found for: HGBA1C ------------------------------------------------------------------------------------------------------------------ No results for input(s): TSH, T4TOTAL, T3FREE, THYROIDAB in the last 72 hours.  Invalid input(s): FREET3 ------------------------------------------------------------------------------------------------------------------ No results for input(s): VITAMINB12, FOLATE, FERRITIN, TIBC, IRON, RETICCTPCT in the last 72 hours.  Coagulation profile Recent Labs  Lab 07/05/19 0420  INR 1.2    No results for input(s): DDIMER in the last 72 hours.  Cardiac Enzymes No results for  input(s): CKMB, TROPONINI, MYOGLOBIN in the last 168 hours.  Invalid input(s): CK ------------------------------------------------------------------------------------------------------------------    Component Value Date/Time   BNP 98.7 07/05/2019 0420    Inpatient Medications  Scheduled Meds:  Chlorhexidine Gluconate Cloth  6 each Topical Q0600   enoxaparin (LOVENOX) injection  40 mg Subcutaneous Q24H   feeding supplement (PRO-STAT SUGAR FREE 64)  30 mL Per Tube BID   folic acid  1 mg Oral Daily   free water  100 mL Per Tube Q4H   guaiFENesin  600 mg Oral BID   lip balm  1 application Topical BID   metoprolol tartrate  25 mg Oral BID   morphine   Intravenous Q4H   multivitamin  15 mL Per Tube Daily   sodium chloride flush  10-40 mL  Intracatheter Q12H   thiamine  100 mg Oral Daily   Or   thiamine  100 mg Intravenous Daily   Continuous Infusions:  sodium chloride 250 mL (07/07/19 2035)   famotidine (PEPCID) IV 20 mg (07/10/19 0853)   feeding supplement (OSMOLITE 1.5 CAL) 1,000 mL (07/09/19 2134)   meropenem (MERREM) IV 1 g (07/10/19 0536)   PRN Meds:.sodium chloride, acetaminophen **OR** acetaminophen, diphenhydrAMINE **OR** diphenhydrAMINE, LORazepam **OR** LORazepam, magic mouthwash, menthol-cetylpyridinium, naloxone **AND** sodium chloride flush, ondansetron (ZOFRAN) IV, phenol, polyethylene glycol, sodium chloride flush  Micro Results Recent Results (from the past 240 hour(s))  Urine culture     Status: None   Collection Time: 07/05/19  2:46 AM   Specimen: In/Out Cath Urine  Result Value Ref Range Status   Specimen Description   Final    IN/OUT CATH URINE Performed at [facility], [address] [place], [place] [address]    Special Requests   Final    NONE Performed at [facility], [address], [place], [place] [address]    Culture   Final    NO GROWTH Performed at [facility], [address] [place], [place] [address]    Report Status 07/06/2019 FINAL  Final  Blood Culture (routine x 2)     Status: None   Collection Time: 07/05/19  3:33 AM   Specimen: BLOOD  Result Value Ref Range Status   Specimen Description   Final    BLOOD RIGHT ARM Performed at [facility], [address], [place], [place] [address]    Special Requests   Final    BOTTLES DRAWN AEROBIC ONLY Blood Culture results may not be optimal due to an inadequate volume of blood received in culture bottles Performed at [facility], [address], [place], [place] [address]    Culture   Final    NO GROWTH 5 DAYS Performed at [facility], [address], [place], [place] [address]    Report Status 07/10/2019 FINAL  Final  SARS Coronavirus 2 [facility] order, Performed in [facility] hospital lab) Nasopharyngeal Nasopharyngeal Swab     Status: None   Collection Time: 07/05/19  3:34 AM   Specimen: Nasopharyngeal Swab  Result Value Ref Range Status   SARS Coronavirus 2 NEGATIVE NEGATIVE Final    Comment: (NOTE) If result is NEGATIVE SARS-CoV-2 target nucleic acids are NOT DETECTED. The SARS-CoV-2 RNA is generally detectable in upper and lower  respiratory specimens during the acute phase of infection. The lowest  concentration of SARS-CoV-2 viral copies this assay can detect is 250  copies / mL. A negative result does not preclude SARS-CoV-2 infection  and should not be used as the sole basis for treatment or other  patient management decisions.  A negative result may occur with  improper specimen collection / handling, submission of specimen other  than nasopharyngeal swab, presence of viral mutation(s) within the  areas targeted by this assay, and inadequate number of viral copies  (<250 copies / mL). A negative result must be combined with clinical  observations, patient history, and epidemiological information. If result is POSITIVE SARS-CoV-2 target nucleic  acids are DETECTED. The SARS-CoV-2 RNA is generally detectable in upper and lower  respiratory specimens dur ing the acute phase of infection.  Positive  results are indicative of active infection with SARS-CoV-2.  Clinical  correlation with patient history and other diagnostic information is  necessary to determine patient  infection status.  Positive results do  not rule out bacterial infection or co-infection with other viruses. If result is PRESUMPTIVE POSTIVE SARS-CoV-2 nucleic acids MAY BE PRESENT.   A presumptive positive result was obtained on the submitted specimen  and confirmed on repeat testing.  While 2019 novel coronavirus  (SARS-CoV-2) nucleic acids may be present in the submitted sample  additional confirmatory testing may be necessary for epidemiological  and / or clinical management purposes  to differentiate between  SARS-CoV-2 and other Sarbecovirus currently known to infect humans.  If clinically indicated additional testing with an alternate test  methodology [phone]) is advised. The SARS-CoV-2 RNA is generally  detectable in upper and lower respiratory sp ecimens during the acute  phase of infection. The expected result is Negative. Fact Sheet for Patients:  BoilerBrush.com.cy Fact Sheet for Healthcare Providers: https://pope.com/ This test is not yet approved or cleared by the [place] FDA and has been authorized for detection and/or diagnosis of SARS-CoV-2 by FDA under an Emergency Use Authorization (EUA).  This EUA will remain in effect (meaning this test can be used) for the duration of the COVID-19 declaration under Section 564(b)(1) of the Act, 21 U.S.C. section 360bbb-3(b)(1), unless the authorization is terminated or revoked sooner. Performed at [facility], [address] [place], [place] [address]   Blood Culture (routine x 2)     Status: None   Collection Time: 07/05/19  7:00  AM   Specimen: BLOOD  Result Value Ref Range Status   Specimen Description   Final    BLOOD LEFT Performed at [facility], [address], [place], [place] [address]    Special Requests   Final    BOTTLES DRAWN AEROBIC AND ANAEROBIC Blood Culture adequate volume Performed at [facility], [address] [place], [place] [address]    Culture   Final    NO GROWTH 5 DAYS Performed at [facility], [address], [place], [place] [address]    Report Status 07/10/2019 FINAL  Final  Gram stain     Status: None   Collection Time: 07/05/19  4:15 PM   Specimen: Fluid  Result Value Ref Range Status   Specimen Description FLUID  Final   Special Requests NONE  Final   Gram Stain   Final    FEW WBC PRESENT, PREDOMINANTLY MONONUCLEAR NO ORGANISMS SEEN Performed at [facility], [address] [place], [place] [address]    Report Status 07/06/2019 FINAL  Final  Culture, body fluid-bottle     Status: None   Collection Time: 07/05/19  4:15 PM   Specimen: Fluid  Result Value Ref Range Status   Specimen Description FLUID  Final   Special Requests NONE  Final   Culture   Final    NO GROWTH 5 DAYS Performed at [facility], [address] [place], [place] [address]    Report Status 07/10/2019 FINAL  Final  MRSA PCR Screening     Status: None   Collection Time: 07/06/19  3:30 AM   Specimen: Nasal Mucosa; Nasopharyngeal  Result Value Ref Range Status   MRSA by PCR NEGATIVE NEGATIVE Final    Comment:        The GeneXpert MRSA Assay (FDA approved for NASAL specimens only), is one component of a comprehensive MRSA colonization surveillance program. It is not intended to diagnose MRSA infection nor to guide or monitor treatment for MRSA infections. Performed at [facility], [address], [place], [place] [address]     Radiology Reports Dg Chest 1 View  Result Date: 06/26/2019 CLINICAL DATA:  Status  post thoracentesis. EXAM: CHEST  1 VIEW COMPARISON:  06/24/2019 FINDINGS: Interval near complete evacuation of the left pleural fluid collection. A small residual effusion remains. No evidence of a postprocedural pneumothorax. Left basilar atelectasis is noted. The right lung is clear. No right-sided effusion. IMPRESSION: Interval near complete evacuation of the left pleural fluid collection with left lower lobe atelectasis. No postprocedural pneumothorax. Electronically Signed   By: Rudie Meyer M.D.   On: 06/26/2019 09:40   Dg Abd 1 View  Result Date: 06/21/2019 CLINICAL DATA:  Pt reports extreme abdominal pain across entire abdomen. Reports hx of pancreatitis. Pain with any movements. EXAM: ABDOMEN - 1 VIEW COMPARISON:  None. FINDINGS: The bowel gas pattern is normal. Cholecystectomy clips. No radio-opaque calculi or other significant radiographic abnormality are seen. IMPRESSION: Negative. Electronically Signed   By: Corlis Leak M.D.   On: 06/21/2019 11:04   Ct Chest W Contrast  Result Date: 06/25/2019 CLINICAL DATA:  Shortness of breath, acute on chronic pancreatitis with multiple peripancreatic fluid collections, recurrent epigastric pain EXAM: CT CHEST, ABDOMEN, AND PELVIS WITH CONTRAST TECHNIQUE: Multidetector CT imaging of the chest, abdomen and pelvis was performed following the standard protocol during bolus administration of intravenous contrast. 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 Normal heart size. No pericardial effusion. Mediastinum/Nodes: No enlarged mediastinal, hilar, or axillary lymph nodes. Thyroid gland, trachea, and esophagus demonstrate no significant findings. Lungs/Pleura: There is a large left pleural effusion with associated atelectasis or consolidation, enlarged compared to prior  examination. There is a new, trace right pleural effusion. There are scattered subpleural ground-glass pulmonary opacities, most conspicuous in the right upper lobe. Musculoskeletal: No chest wall mass or suspicious bone lesions identified. CT ABDOMEN PELVIS FINDINGS Hepatobiliary: No focal liver abnormality is seen. Status post cholecystectomy. No biliary dilatation. Pancreas: Redemonstrated extensive retroperitoneal inflammation and multiple peripancreatic fluid collections. There is a new discrete fluid collection or component adjacent to the greater curvature of the stomach and spleen measuring approximately 6.2 x 2.7 cm (series 3, image 52). There is an additional new fluid collection within or adjacent to the omentum measuring 4.7 x 2.5 cm (series 3, image 75). There is an additional new fluid collection within the small bowel mesentery measuring 5.0 x 3.2 cm (series 3, image 86). Other fluid collections are not significantly changed, for example anterior to the pancreatic neck (series 3, image 67), adjacent to the porta hepatis (series 3, image 67), adjacent to the left kidney and splenic flexure (series 3, image 73), and posterior to the left kidney (series 3, image 65). Spleen: Splenomegaly, maximum span 14.4 cm. Adrenals/Urinary Tract: Adrenal glands are unremarkable. Kidneys are normal, without renal calculi, solid lesion, or hydronephrosis. Bladder is unremarkable. Stomach/Bowel: Stomach is within normal limits. Appendix appears normal. Inflammatory thickening of the transverse colon (series 3, image 81), similar to prior examination, and of the mid small bowel, particularly a segment in the anterior abdomen (series 3, image 95). Vascular/Lymphatic: Aortic atherosclerosis. No enlarged abdominal or pelvic lymph nodes. Reproductive: No mass or other abnormality. Other: Extensive anasarca. Small volume ascites, similar to prior examination. Musculoskeletal: No acute or significant osseous findings.  IMPRESSION: 1. There is a large left pleural effusion with  associated atelectasis or consolidation, enlarged compared to prior examination. There is a new, trace right pleural effusion. 2. There are scattered subpleural ground-glass pulmonary opacities, most conspicuous in the right upper lobe. These are nonspecific and infectious or inflammatory. 3. Redemonstrated extensive retroperitoneal inflammation and multiple peripancreatic fluid collections. There is a new discrete fluid collection or component adjacent to the greater curvature of the stomach and spleen measuring approximately 6.2 x 2.7 cm (series 3, image 52). There is an additional new fluid collection within or adjacent to the omentum measuring 4.7 x 2.5 cm (series 3, image 75). There is an additional new fluid collection within the small bowel mesentery measuring 5.0 x 3.2 cm (series 3, image 86). 4. Other fluid collections are not significantly changed, for example anterior to the pancreatic neck (series 3, image 67), adjacent to the porta hepatis (series 3, image 67), adjacent to the left kidney and splenic flexure (series 3, image 73), and posterior to the left kidney (series 3, image 65). 5. Inflammatory thickening of the transverse colon (series 3, image 81), similar to prior examination, and of the mid small bowel, particularly a segment in the anterior abdomen (series 3, image 95). 6. Extensive anasarca. Small volume ascites, similar to prior examination. Electronically Signed   By: Lauralyn Primes M.D.   On: 06/25/2019 14:18   Ct Angio Chest Pe W/cm &/or Wo Cm  Result Date: 07/05/2019 CLINICAL DATA:  Chest pain.  Complicated pancreatitis. EXAM: CT ANGIOGRAPHY CHEST WITH CONTRAST TECHNIQUE: Multidetector CT imaging of the chest was performed using the standard protocol during bolus administration of intravenous contrast. Multiplanar CT image reconstructions and MIPs were obtained to evaluate the vascular anatomy. CONTRAST:  OMNIPAQUE  IOHEXOL 350 MG/ML SOLN COMPARISON:  Chest CT from 10 days ago FINDINGS: Cardiovascular: Satisfactory opacification of the pulmonary arteries to the segmental level. No evidence of pulmonary embolism when accounting for intermittent motion artifact. Normal heart size. No pericardial effusion. Aberrant right subclavian artery Mediastinum/Nodes: Negative for adenopathy or mass. Lungs/Pleura: Large left pleural effusion that is layering. Trace right pleural fluid. Near complete collapse of the left lower lobe. No consolidation or edema. Upper Abdomen: As reported separately. Musculoskeletal: No acute or aggressive finding Review of the MIP images confirms the above findings. IMPRESSION: 1. Negative for pulmonary embolism. 2. Large layering left pleural effusion with near complete lower lobe collapse. Electronically Signed   By: Marnee Spring M.D.   On: 07/05/2019 07:16   Ct Abdomen Pelvis W Contrast  Result Date: 06/25/2019 CLINICAL DATA:  Shortness of breath, acute on chronic pancreatitis with multiple peripancreatic fluid collections, recurrent epigastric pain EXAM: CT CHEST, ABDOMEN, AND PELVIS WITH CONTRAST TECHNIQUE: Multidetector CT imaging of the chest, abdomen and pelvis was performed following the standard protocol during bolus administration of intravenous contrast. 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 Normal heart size. No pericardial effusion. Mediastinum/Nodes: No enlarged mediastinal, hilar, or axillary lymph nodes. Thyroid gland, trachea, and esophagus demonstrate no significant findings. Lungs/Pleura: There is a large left pleural effusion with associated atelectasis or consolidation, enlarged compared to prior examination. There is a new, trace right pleural effusion. There are scattered subpleural  ground-glass pulmonary opacities, most conspicuous in the right upper lobe. Musculoskeletal: No chest wall mass or suspicious bone lesions identified. CT ABDOMEN PELVIS FINDINGS Hepatobiliary: No focal liver abnormality is seen. Status post cholecystectomy. No biliary  dilatation. Pancreas: Redemonstrated extensive retroperitoneal inflammation and multiple peripancreatic fluid collections. There is a new discrete fluid collection or component adjacent to the greater curvature of the stomach and spleen measuring approximately 6.2 x 2.7 cm (series 3, image 52). There is an additional new fluid collection within or adjacent to the omentum measuring 4.7 x 2.5 cm (series 3, image 75). There is an additional new fluid collection within the small bowel mesentery measuring 5.0 x 3.2 cm (series 3, image 86). Other fluid collections are not significantly changed, for example anterior to the pancreatic neck (series 3, image 67), adjacent to the porta hepatis (series 3, image 67), adjacent to the left kidney and splenic flexure (series 3, image 73), and posterior to the left kidney (series 3, image 65). Spleen: Splenomegaly, maximum span 14.4 cm. Adrenals/Urinary Tract: Adrenal glands are unremarkable. Kidneys are normal, without renal calculi, solid lesion, or hydronephrosis. Bladder is unremarkable. Stomach/Bowel: Stomach is within normal limits. Appendix appears normal. Inflammatory thickening of the transverse colon (series 3, image 81), similar to prior examination, and of the mid small bowel, particularly a segment in the anterior abdomen (series 3, image 95). Vascular/Lymphatic: Aortic atherosclerosis. No enlarged abdominal or pelvic lymph nodes. Reproductive: No mass or other abnormality. Other: Extensive anasarca. Small volume ascites, similar to prior examination. Musculoskeletal: No acute or significant osseous findings. IMPRESSION: 1. There is a large left pleural effusion with associated atelectasis or  consolidation, enlarged compared to prior examination. There is a new, trace right pleural effusion. 2. There are scattered subpleural ground-glass pulmonary opacities, most conspicuous in the right upper lobe. These are nonspecific and infectious or inflammatory. 3. Redemonstrated extensive retroperitoneal inflammation and multiple peripancreatic fluid collections. There is a new discrete fluid collection or component adjacent to the greater curvature of the stomach and spleen measuring approximately 6.2 x 2.7 cm (series 3, image 52). There is an additional new fluid collection within or adjacent to the omentum measuring 4.7 x 2.5 cm (series 3, image 75). There is an additional new fluid collection within the small bowel mesentery measuring 5.0 x 3.2 cm (series 3, image 86). 4. Other fluid collections are not significantly changed, for example anterior to the pancreatic neck (series 3, image 67), adjacent to the porta hepatis (series 3, image 67), adjacent to the left kidney and splenic flexure (series 3, image 73), and posterior to the left kidney (series 3, image 65). 5. Inflammatory thickening of the transverse colon (series 3, image 81), similar to prior examination, and of the mid small bowel, particularly a segment in the anterior abdomen (series 3, image 95). 6. Extensive anasarca. Small volume ascites, similar to prior examination. Electronically Signed   By: Lauralyn Primes M.D.   On: 06/25/2019 14:18   Ct Abdomen Pelvis W Contrast  Result Date: 06/20/2019 CLINICAL DATA:  Abdominal pain.  Neutropenia. EXAM: CT ABDOMEN AND PELVIS WITH CONTRAST TECHNIQUE: Multidetector CT imaging of the abdomen and pelvis was performed using the standard protocol following bolus administration of intravenous contrast. CONTRAST:  OMNIPAQUE IOHEXOL 300 MG/ML  SOLN COMPARISON:  None. FINDINGS: Lower chest: There is a moderate-sized left-sided pleural effusion with near complete collapse of the left lower lobe.The heart  size is normal. Hepatobiliary: The liver is normal. Status post cholecystectomy.There is no biliary ductal dilation. Pancreas: There are multiple peripancreatic fluid collections the largest of which measures approximately 3.7 by 1.8 cm. The pancreas appears to enhance symmetrically. Spleen: The spleen is enlarged measuring approximately 14 cm craniocaudad. Adrenals/Urinary Tract: --Adrenal glands:  No adrenal hemorrhage. --Right kidney/ureter: No hydronephrosis or perinephric hematoma. --Left kidney/ureter: There is no left-sided hydronephrosis. There is a complex collection in the left posterior pararenal space measuring approximately 8.5 x 2.5 cm. --Urinary bladder: Unremarkable. Stomach/Bowel: --Stomach/Duodenum: There is some wall thickening of the stomach. --Small bowel: No dilatation or inflammation. --Colon: There are soft tissue densities along the descending colon measuring approximately 2.8 x 2.4 cm. There is a collection of the splenic flexure measuring 4.7 x 2.7 cm causing mass effect on the nearby:Marland Kitchen There is wall thickening of the transverse colon without evidence of an obstruction. --Appendix: Not visualized. No right lower quadrant inflammation or free fluid. Vascular/Lymphatic: Atherosclerotic changes are noted of the abdominal aorta without evidence of an abdominal aortic aneurysm. The portal vein and splenic vein remain patent. The splenic artery remains patent. --No retroperitoneal lymphadenopathy. --No mesenteric lymphadenopathy. --No pelvic or inguinal lymphadenopathy. Reproductive: Unremarkable Other: There is a small volume of free fluid in the pelvis. There are multiple scattered collections in the retroperitoneum and peritoneal cavity. A few these collections demonstrate mild rim enhancement. For example in the left upper quadrant there is a 3.9 x 1.9 cm collection that demonstrates mild peripheral rim enhancement. There is a 3.8 by 4.4 cm collection in the region of the gallbladder fossa.  Given the surgical clips in the gallbladder fossa this is favored to represent a loculated fluid collection as opposed to a remnant gallbladder. Musculoskeletal. No acute displaced fractures. IMPRESSION: 1. Overall findings concerning for pancreatitis with multiple loculated fluid collections as detailed above. Some of the smaller collections, for example in the left upper quadrant, demonstrate rim enhancement. An abscess is not excluded. There is no CT evidence for pancreatic necrosis. 2. Moderate-sized left-sided pleural effusion with at least partial collapse of the left lower lobe. 3. Diffuse wall thickening of the transverse colon and splenic flexure favored to be reactive. Other considerations include infectious or inflammatory colitis. 4. Splenomegaly.  The splenic vein remains patent. Electronically Signed   By: Katherine Mantle M.D.   On: 06/20/2019 21:07   Dg Chest Port 1 View  Result Date: 07/10/2019 CLINICAL DATA:  Leukocytosis EXAM: PORTABLE CHEST 1 VIEW COMPARISON:  None. FINDINGS: Feeding tube tip is in the distal esophagus. There is a small left pleural effusion with patchy consolidation in the left base. There is atelectatic change in each lung base as well. Heart is upper normal in size with pulmonary vascularity normal. No adenopathy. No bone lesions. IMPRESSION: Feeding tube tip in distal esophagus. Persistent patchy consolidation left base with bibasilar atelectasis. Small left pleural effusion. Stable cardiac silhouette. These results will be called to the ordering clinician or representative by the Radiologist Assistant, and communication documented in the PACS or zVision Dashboard. Electronically Signed   By: Bretta Bang III M.D.   On: 07/10/2019 08:16   Dg Chest Port 1 View  Result Date: 07/05/2019 CLINICAL DATA:  Status post left-sided thoracentesis EXAM: PORTABLE CHEST 1 VIEW COMPARISON:  July 05, 2019 FINDINGS: The left-sided pleural effusion has significantly decreased  in size from the prior study. There is no evidence for left-sided pneumothorax. Bibasilar airspace opacities are noted, left worse than right. The lung volumes are low. The heart size is stable from prior study. IMPRESSION: 1. No pneumothorax status post left-sided thoracentesis. 2. Significant interval decrease in size of the left-sided pleural effusion. 3. Bibasilar airspace opacities, left greater than right, favored to represent atelectasis. 4. Low lung volumes. Electronically Signed   By: Beryle Quant.D.  On: 07/05/2019 16:31   Dg Chest Portable 1 View  Result Date: 07/05/2019 CLINICAL DATA:  Chest pain. EXAM: PORTABLE CHEST 1 VIEW COMPARISON:  Radiograph 06/26/2019, CT 06/25/2019 FINDINGS: Re-accumulation of left pleural effusion after thoracentesis. Pleural effusion is moderate in size with associated underlying left lung base atelectasis/consolidation. Possible small right pleural effusion. Unchanged heart size and mediastinal contours. Increasing patchy right lung base opacity. No pneumothorax. IMPRESSION: 1. Re-accumulation of left pleural effusion after thoracentesis, now moderate in size. Associated left lung base atelectasis/consolidation. 2. Possible small right pleural effusion. Increasing right lung base opacity may be atelectasis or pneumonia. Electronically Signed   By: Narda Rutherford M.D.   On: 07/05/2019 03:22   Dg Chest Port 1 View  Result Date: 06/24/2019 CLINICAL DATA:  Chest pain for 1 hour EXAM: PORTABLE CHEST 1 VIEW COMPARISON:  06/20/2019 FINDINGS: Left-sided pleural effusion is again seen and slightly enlarged when compare with the prior study. Likely underlying atelectasis/infiltrate is present in the left base. The right lung is clear. No bony abnormality is noted. IMPRESSION: Increasing left-sided pleural effusion. Electronically Signed   By: Alcide Clever M.D.   On: 06/24/2019 19:33   Dg Chest Portable 1 View  Result Date: 06/20/2019 CLINICAL DATA:  Chest pain  EXAM: PORTABLE CHEST 1 VIEW COMPARISON:  None. FINDINGS: There is airspace consolidation in the left base with left pleural effusion. Lungs elsewhere are clear. Heart size and pulmonary vascularity are normal. No adenopathy. No bone lesions. IMPRESSION: Left lower lobe airspace consolidation consistent with pneumonia. Small left pleural effusion. Lungs elsewhere clear. No adenopathy. Heart size normal. Followup PA and lateral chest radiographs recommended in 3-4 weeks following trial of antibiotic therapy to ensure resolution and exclude underlying malignancy. Electronically Signed   By: Bretta Bang III M.D.   On: 06/20/2019 19:32   Dg Abd Portable 1v  Result Date: 07/10/2019 CLINICAL DATA:  Check feeding catheter placement EXAM: PORTABLE ABDOMEN - 1 VIEW COMPARISON:  07/09/2019 FINDINGS: Weighted feeding catheter is noted with the tip in the distal stomach. Mildly dilated small bowel is noted similar to that seen on the prior exam. IMPRESSION: Feeding catheter within the distal stomach. Electronically Signed   By: Alcide Clever M.D.   On: 07/10/2019 11:38   Dg Abd Portable 1v  Result Date: 07/09/2019 CLINICAL DATA:  Feeding tube placement EXAM: PORTABLE ABDOMEN - 1 VIEW COMPARISON:  07/05/2019 FINDINGS: Bibasilar pleural effusions and airspace disease. Esophageal tube tip overlies the proximal stomach. Dilated small bowel in the central abdomen. IMPRESSION: Esophageal tube tip overlies the proximal stomach. Electronically Signed   By: Jasmine Pang M.D.   On: 07/09/2019 19:32   Ct Renal Stone Study  Result Date: 07/05/2019 CLINICAL DATA:  Chest pain. EXAM: CT ABDOMEN AND PELVIS WITHOUT CONTRAST TECHNIQUE: Multidetector CT imaging of the abdomen and pelvis was performed following the standard protocol without IV contrast. COMPARISON:  Fifteen days ago FINDINGS: Lower chest: At least moderate left pleural effusion with multi segment atelectasis. Pending chest CT. Hepatobiliary: No focal liver  abnormality.Cholecystectomy. Pancreas: History of recurrent pancreatitis. Progressive organized collections: 1. 8 x 4 cm along the upper greater curvature of the stomach 2. 6 cm along the mid greater curvature of the stomach-which may be connected to #1. 3. Peripancreatic to splenic tail, 15 x 4 by up to 6 cm-with further extent tracking inferiorly along the descending colon. 4. Ligamentum venosum of the liver measuring 5 x 3 cm 5. Ventral midline abdomen at 5 cm 6. Lower ventral midline  abdomen with interloop distortion, 9 cm. These collections exert mass effect on the stomach, transverse colon, and descending colon. Fat density with rim of stranding posterior to the left kidney in the posterior pararenal space, stable in compatible with fat necrosis. There is generalized retroperitoneal edema about the collections and pancreas. No gas within any of the collections to suggest fistula or definite superinfection. Spleen: Stable size and density. Adrenals/Urinary Tract: Negative adrenals. No hydronephrosis or stone. Unremarkable bladder. Stomach/Bowel: No obstruction. Low-density about the hepatic flexure is likely a combination of reactive wall thickening and pericolonic fluid. Vascular/Lymphatic: No visible acute vascular abnormality. No noted adenopathy. Reproductive:Negative Other: Right upper quadrant ascites which may be loculated. Musculoskeletal: No acute abnormalities. IMPRESSION: 1. Recent pancreatitis with multiple organized fluid collections in the abdomen as listed and measured above. The largest measures 15 x 4 x 6 cm. Collections exert mass effect on the stomach, transverse colon, descending colon without obstruction. 2. Stable left posterior pararenal fat necrosis. 3. At least moderate left pleural effusion with multi segment atelectasis. Electronically Signed   By: Marnee Spring M.D.   On: 07/05/2019 07:12   Ir Thoracentesis Asp Pleural Space W/img Guide  Result Date: 06/26/2019 INDICATION:  Shortness of breath. Left-sided pleural effusion. Request for diagnostic and therapeutic thoracentesis. EXAM: ULTRASOUND GUIDED LEFT THORACENTESIS MEDICATIONS: None. COMPLICATIONS: None immediate. PROCEDURE: An ultrasound guided thoracentesis was thoroughly discussed with the patient and questions answered. The benefits, risks, alternatives and complications were also discussed. The patient understands and wishes to proceed with the procedure. Written consent was obtained. Ultrasound was performed to localize and mark an adequate pocket of fluid in the left chest. The area was then prepped and draped in the normal sterile fashion. 1% Lidocaine was used for local anesthesia. Under ultrasound guidance a 6 Fr Safe-T-Centesis catheter was introduced. Thoracentesis was performed. The catheter was removed and a dressing applied. FINDINGS: A total of approximately 1.3 L of hazy yellow fluid was removed. Samples were sent to the laboratory as requested by the clinical team. IMPRESSION: Successful ultrasound guided left thoracentesis yielding 1.3 L of pleural fluid. Read by: Brayton El PA-C Electronically Signed   By: Richarda Overlie M.D.   On: 06/26/2019 09:19   US Thoracentesis Asp Pleural Space W/img Guide  Result Date: 07/05/2019 INDICATION: Patient with history of recurrent pancreatitis, GERD, dyspnea, recurrent left pleural effusion. Request made for diagnostic and therapeutic left thoracentesis. EXAM: ULTRASOUND GUIDED DIAGNOSTIC AND THERAPEUTIC LEFT THORACENTESIS MEDICATIONS: None COMPLICATIONS: None immediate. PROCEDURE: An ultrasound guided thoracentesis was thoroughly discussed with the patient and questions answered. The benefits, risks, alternatives and complications were also discussed. The patient understands and wishes to proceed with the procedure. Written consent was obtained. Ultrasound was performed to localize and mark an adequate pocket of fluid in the left chest. The area was then prepped and draped  in the normal sterile fashion. 1% Lidocaine was used for local anesthesia. Under ultrasound guidance a 6 Fr Safe-T-Centesis catheter was introduced. Thoracentesis was performed. The catheter was removed and a dressing applied. FINDINGS: A total of approximately 770 cc of yellow fluid was removed. Samples were sent to the laboratory as requested by the clinical team. IMPRESSION: Successful ultrasound guided diagnostic and therapeutic left thoracentesis yielding 770 cc of pleural fluid. Read by: Jeananne Rama, PA-C Electronically Signed   By: Richarda Overlie M.D.   On: 07/05/2019 16:12    Time Spent in minutes  35   Jaskirat Zertuche M.D on 07/10/2019 at 11:49 AM  Between 7am to  7pm - Pager - [phone]  After 7pm go to [email or web address] - password [facility]  [facility] -  Office  [phone]

## 2019-07-10 NOTE — Progress Notes (Signed)
   Vital Signs MEWS/VS Documentation      07/10/2019 0548 07/10/2019 0700 07/10/2019 0840 07/10/2019 0921   MEWS Score:  2  2  2  2    MEWS Score Color:  Yellow  Yellow  Yellow  Yellow   Resp:  18  -  18  -   Pulse:  (!) 115  -  -  -   BP:  133/90  -  -  -   Temp:  98.3 F (36.8 C)  -  -  -   O2 Device:  Room Air  -  Room Air  -   O2 Flow Rate (L/min):  -  -  0 L/min  -   FiO2 (%):  -  -  33 %  -            [place] A 07/10/2019,9:25 AM

## 2019-07-10 NOTE — Progress Notes (Signed)
Patient ID: Kyle Mooney, male   DOB: 1971-05-27, 48 y.o.   MRN: [id number]       Subjective: Cortrak in place.  No issues with feedings except having some diarrhea which is not unexpected.  Feels like the pulled an ab muscle trying to sit up overnight.  Otherwise no new issues.  Objective: Vital signs in last 24 hours: Temp:  [98 F (36.7 C)-99.6 F (37.6 C)] 98.3 F (36.8 C) (08/21 0548) Pulse Rate:  [93-115] 115 (08/21 0548) Resp:  [14-24] 18 (08/21 0840) BP: (124-139)/(77-90) 133/90 (08/21 0548) SpO2:  [93 %-98 %] 98 % (08/21 0840) FiO2 (%):  [33 %] 33 % (08/21 0840) Weight:  [78.9 kg] 78.9 kg (08/21 0444) Last BM Date: 07/09/19  Intake/Output from previous day: 08/20 0701 - 08/21 0700 In: 1991.6 [P.O.:480; I.V.:227.9; NG/GT:806; IV Piggyback:477.7] Out: 300 [Urine:300] Intake/Output this shift: No intake/output data recorded.  PE: Abd: soft, mild tenderness across upper abdomen, Cortrak in place with TFs running, +BS  Lab Results:  Recent Labs    07/09/19 0420 07/10/19 0521  WBC 20.4* 28.0*  HGB 9.8* 10.8*  HCT 31.6* 34.6*  PLT 564* 676*   BMET Recent Labs    07/09/19 0420 07/10/19 0521  NA 128* 131*  K 3.7 3.9  CL 93* 93*  CO2 27 29  GLUCOSE 94 124*  BUN 10 10  CREATININE 0.47* 0.49*  CALCIUM 8.0* 8.3*   PT/INR No results for input(s): LABPROT, INR in the last 72 hours. CMP     Component Value Date/Time   NA 131 (L) 07/10/2019 0521   K 3.9 07/10/2019 0521   CL 93 (L) 07/10/2019 0521   CO2 29 07/10/2019 0521   GLUCOSE 124 (H) 07/10/2019 0521   BUN 10 07/10/2019 0521   CREATININE 0.49 (L) 07/10/2019 0521   CALCIUM 8.3 (L) 07/10/2019 0521   PROT 5.8 (L) 07/08/2019 0403   ALBUMIN 1.6 (L) 07/08/2019 0403   AST 25 07/08/2019 0403   ALT 20 07/08/2019 0403   ALKPHOS 139 (H) 07/08/2019 0403   BILITOT 0.8 07/08/2019 0403   GFRNONAA >60 07/10/2019 0521   GFRAA >60 07/10/2019 0521   Lipase     Component Value Date/Time   LIPASE 122 (H)  07/08/2019 0403       Studies/Results: Dg Chest Port 1 View  Result Date: 07/10/2019 CLINICAL DATA:  Leukocytosis EXAM: PORTABLE CHEST 1 VIEW COMPARISON:  None. FINDINGS: Feeding tube tip is in the distal esophagus. There is a small left pleural effusion with patchy consolidation in the left base. There is atelectatic change in each lung base as well. Heart is upper normal in size with pulmonary vascularity normal. No adenopathy. No bone lesions. IMPRESSION: Feeding tube tip in distal esophagus. Persistent patchy consolidation left base with bibasilar atelectasis. Small left pleural effusion. Stable cardiac silhouette. These results will be called to the ordering clinician or representative by the Radiologist Assistant, and communication documented in the PACS or zVision Dashboard. Electronically Signed   By: Lowella Grip III M.D.   On: 07/10/2019 08:16   Dg Abd Portable 1v  Result Date: 07/09/2019 CLINICAL DATA:  Feeding tube placement EXAM: PORTABLE ABDOMEN - 1 VIEW COMPARISON:  07/05/2019 FINDINGS: Bibasilar pleural effusions and airspace disease. Esophageal tube tip overlies the proximal stomach. Dilated small bowel in the central abdomen. IMPRESSION: Esophageal tube tip overlies the proximal stomach. Electronically Signed   By: Donavan Foil M.D.   On: 07/09/2019 19:32    Anti-infectives: Anti-infectives (From admission, onward)  Start     Dose/Rate Route Frequency Ordered Stop   07/05/19 1800  ceFEPIme (MAXIPIME) 2 g in sodium chloride 0.9 % 100 mL IVPB  Status:  Discontinued     2 g 200 mL/hr over 30 Minutes Intravenous Every 8 hours 07/05/19 0852 07/05/19 1013   07/05/19 1400  meropenem (MERREM) 1 g in sodium chloride 0.9 % 100 mL IVPB     1 g 200 mL/hr over 30 Minutes Intravenous Every 8 hours 07/05/19 1013 07/15/19 1359   07/05/19 1000  metroNIDAZOLE (FLAGYL) IVPB 500 mg  Status:  Discontinued     500 mg 100 mL/hr over 60 Minutes Intravenous Every 8 hours 07/05/19 0833  07/05/19 1013   07/05/19 0345  vancomycin (VANCOCIN) IVPB 1000 mg/200 mL premix  Status:  Discontinued     1,000 mg 200 mL/hr over 60 Minutes Intravenous  Once 07/05/19 0333 07/05/19 0336   07/05/19 0345  ceFEPIme (MAXIPIME) 2 g in sodium chloride 0.9 % 100 mL IVPB     2 g 200 mL/hr over 30 Minutes Intravenous  Once 07/05/19 0333 07/05/19 1001   07/05/19 0345  vancomycin (VANCOCIN) 1,500 mg in sodium chloride 0.9 % 500 mL IVPB     1,500 mg 250 mL/hr over 120 Minutes Intravenous  Once 07/05/19 0336 07/05/19 1001       Assessment/Plan Pancreatitis with pseudocysts  -Cortrak placed and on TFs.  Tube terminates in the stomach.  Would benefit from tube being post pyloric -WBC bumped from 20 to 28.  Patient is still on merrem -has some new right-sided pain.  Feels this is a pulled muscle from sitting up? -will follow, no acute plans for surgical intervention  FEN -IVFs,TFs via Cortrak VTE -Lovenox ID -Merrem   LOS: 5 days    Henreitta Cea , [facility] 07/10/2019, 9:11 AM Pager: [phone]

## 2019-07-10 NOTE — Progress Notes (Signed)
Received call back from [facility] transfer line. Spoke with gastroenterologist Dr. Carlton Adam.  She informed me that he had an appointment with GI on Monday 8/24.  Given his active pain requiring morphine PCA and enteral feeding he will not be able to discharge from the hospital by then to see the gastroenterologist. She agrees with patient being transferred to [facility] (medical service).  Patient's information has been provided to the transfer service team.  Currently there are no beds available and will continue to follow daily.

## 2019-07-10 NOTE — Progress Notes (Addendum)
Pt called that he moved to use the bathroom and he displaced the core track feeding tube from 70 mark to 35. I advanced it back but he is c/o pain and says he feels its is displaced. Sent on call MD Bodenheimer a message  To inquire next steps.  Put in an order for tube placement verification @ [phone]

## 2019-07-10 NOTE — Plan of Care (Signed)
  Problem: Activity: Goal: Risk for activity intolerance will decrease Outcome: Progressing   Problem: Nutrition: Goal: Adequate nutrition will be maintained Outcome: Progressing   

## 2019-07-10 NOTE — Progress Notes (Signed)
Physical Therapy Treatment Patient Details Name: Kyle Mooney MRN: [id number] DOB: 1971/10/08 Today's Date: 07/10/2019    History of Present Illness 48 year old gentleman with prior history of recurrent pancreatitis IBS, GERD follows up with [facility] for his idiopathic pancreatitis and pancreatic cysts and admitted for idiopathic acute pancreatitis    PT Comments    Pt very motivated today and tolerated improved distance (around unit twice).  Pt agreeable to mobilize with nursing staff (RN aware).     Follow Up Recommendations  Home health PT;Outpatient PT(OP PT if possible)     Equipment Recommendations  None recommended by PT    Recommendations for Other Services       Precautions / Restrictions Precautions Precautions: Fall Precaution Comments: multiple lines    Mobility  Bed Mobility Overal bed mobility: Needs Assistance Bed Mobility: Supine to Sit     Supine to sit: Min guard;HOB elevated     General bed mobility comments: increased time and effort due to pain  Transfers Overall transfer level: Needs assistance Equipment used: Rolling walker (2 wheeled) Transfers: Sit to/from Stand Sit to Stand: Min guard         General transfer comment: Min guard for safety, increased time to rise; cues for hand placement  Ambulation/Gait Ambulation/Gait assistance: Min guard Gait Distance (Feet): 800 Feet Assistive device: Rolling walker (2 wheeled) Gait Pattern/deviations: Step-through pattern;Decreased stride length;Trunk flexed     General Gait Details: min/guard for safety, pt felt more steady with RW, HR up to 130 bpm during ambulation, cues for posture and RW positioning   Stairs             Wheelchair Mobility    Modified Rankin (Stroke Patients Only)       Balance                                            Cognition Arousal/Alertness: Awake/alert Behavior During Therapy: WFL for tasks assessed/performed Overall  Cognitive Status: Within Functional Limits for tasks assessed                                        Exercises      General Comments        Pertinent Vitals/Pain Pain Assessment: 0-10 Pain Score: 6  Pain Location: abdomen Pain Descriptors / Indicators: Aching;Discomfort Pain Intervention(s): Monitored during session;Repositioned;PCA encouraged    Home Living                      Prior Function            PT Goals (current goals can now be found in the care plan section) Progress towards PT goals: Progressing toward goals    Frequency    Min 3X/week      PT Plan Current plan remains appropriate    Co-evaluation              AM-PAC PT "6 Clicks" Mobility   Outcome Measure  Help needed turning from your back to your side while in a flat bed without using bedrails?: A Little Help needed moving from lying on your back to sitting on the side of a flat bed without using bedrails?: A Little Help needed moving to and from a bed to a chair (including a  wheelchair)?: A Little Help needed standing up from a chair using your arms (e.g., wheelchair or bedside chair)?: A Little Help needed to walk in hospital room?: A Little Help needed climbing 3-5 steps with a railing? : A Little 6 Click Score: 18    End of Session   Activity Tolerance: Patient tolerated treatment well Patient left: in chair;with call bell/phone within reach(awaiting transporter) Nurse Communication: Mobility status PT Visit Diagnosis: Difficulty in walking, not elsewhere classified (R26.2)     Time: [id number] PT Time Calculation (min) (ACUTE ONLY): 22 min  Charges:  $Gait Training: 8-22 mins                    Carmelia Bake, PT, [place] Office: [phone] Pager: [place] 07/10/2019, 4:07 PM

## 2019-07-11 ENCOUNTER — Inpatient Hospital Stay (HOSPITAL_COMMUNITY): Payer: Self-pay

## 2019-07-11 DIAGNOSIS — D72828 Other elevated white blood cell count: Secondary | ICD-10-CM

## 2019-07-11 LAB — GLUCOSE, CAPILLARY
Glucose-Capillary: 103 mg/dL — ABNORMAL HIGH (ref 70–99)
Glucose-Capillary: 104 mg/dL — ABNORMAL HIGH (ref 70–99)
Glucose-Capillary: 104 mg/dL — ABNORMAL HIGH (ref 70–99)
Glucose-Capillary: 110 mg/dL — ABNORMAL HIGH (ref 70–99)
Glucose-Capillary: 97 mg/dL (ref 70–99)

## 2019-07-11 LAB — COMPREHENSIVE METABOLIC PANEL
ALT: 34 U/L (ref 0–44)
AST: 36 U/L (ref 15–41)
Albumin: 1.4 g/dL — ABNORMAL LOW (ref 3.5–5.0)
Alkaline Phosphatase: 172 U/L — ABNORMAL HIGH (ref 38–126)
Anion gap: 7 (ref 5–15)
BUN: 11 mg/dL (ref 6–20)
CO2: 30 mmol/L (ref 22–32)
Calcium: 7.9 mg/dL — ABNORMAL LOW (ref 8.9–10.3)
Chloride: 93 mmol/L — ABNORMAL LOW (ref 98–111)
Creatinine, Ser: 0.46 mg/dL — ABNORMAL LOW (ref 0.61–1.24)
GFR calc Af Amer: >60 mL/min (ref >60)
GFR calc non Af Amer: >60 mL/min (ref >60)
Glucose, Bld: 114 mg/dL — ABNORMAL HIGH (ref 70–99)
Potassium: 3.8 mmol/L (ref 3.5–5.1)
Sodium: 130 mmol/L — ABNORMAL LOW (ref 135–145)
Total Bilirubin: 0.5 mg/dL (ref 0.3–1.2)
Total Protein: 5.4 g/dL — ABNORMAL LOW (ref 6.5–8.1)

## 2019-07-11 NOTE — Progress Notes (Signed)
   07/11/19 1400  Vitals  Temp 98.5 F (36.9 C)  Temp Source Oral  BP 127/76  MAP (mmHg) 90  BP Location Right Arm  BP Method Automatic  Patient Position (if appropriate) Sitting  Pulse Rate (!) 113  Resp 15  Oxygen Therapy  SpO2 96 %  O2 Device Room Air  MEWS Score  MEWS RR 0  MEWS Pulse 2  MEWS Systolic 0  MEWS LOC 0  MEWS Temp 0  MEWS Score 2  MEWS Score Color Yellow   Pt HR elevated at 111. RN intitated yellow mews. Pt stable at this time. Will continue to monitor pt.

## 2019-07-11 NOTE — Consult Note (Signed)
Chief Complaint: Patient was seen in consultation today for pancreatic pseudocyst  Referring Physician(s): Dr. Barry Dienes  Supervising Physician: Aletta Edouard  Patient Status: [facility] - In-pt  History of Present Illness: Kyle Mooney is a 48 y.o. male with history of alcohol abuse now with idiopathic pancreatitis with uninfected necrosis followed by GI at [facility] admitted with recurrent epigastric pain and acute on chronic pancreatitis.  He also presented with left pleural effusion status post thoracentesis 07/05/2019 which was exudative in nature.  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  While awaiting bed transfer he has developed worsening WBC, now 32.2.  He remains afebrile.  IR consulted for possible aspiration and drainage of largest right upper quadrant cyst.  Patient assessed at bedside.  He appears weak with severe protein calorie malnutrition.  NG tube in place and receiving TF.  He expresses frustration with his diagnosis.  He understands plan is for transfer to his primary team as soon as possible and that the limiting factor for this has been bed placement.  He reports his pain is well controlled.  States he feels about the same as when he was admitted.  Past Medical History:  Diagnosis Date   Ex-cigarette smoker 04/2019   GERD (gastroesophageal reflux disease)    IBS (irritable bowel syndrome)    Idiopathic pancreatitis     Past Surgical History:  Procedure Laterality Date   CHOLECYSTECTOMY     IR THORACENTESIS ASP PLEURAL SPACE W/IMG GUIDE  06/26/2019    Allergies: Codeine, Other, and Fluphenazine hcl  Medications: Prior to Admission medications   Medication Sig Start Date End Date Taking? Authorizing Provider  acetaminophen (TYLENOL) 650 MG CR tablet Take 325 mg by mouth every 8 (eight) hours as needed for pain.   Yes [provider]    dexlansoprazole (DEXILANT) 60 MG capsule Take 60 mg by mouth daily.   Yes [provider]  dicyclomine (BENTYL) 10 MG capsule Take 10 mg by mouth 3 (three) times daily. 04/10/19  Yes [provider]  metoprolol tartrate (LOPRESSOR) 25 MG tablet Take 1 tablet (25 mg total) by mouth 2 (two) times daily. 06/28/19 06/27/20 Yes [place] Donnamarie Poag, DO  Multiple Vitamin (MULTIVITAMIN WITH MINERALS) TABS tablet Take 1 tablet by mouth daily.   Yes [provider]  ondansetron (ZOFRAN) 4 MG tablet Take 1 tablet (4 mg total) by mouth daily as needed for nausea or vomiting. 06/28/19 06/27/20 Yes [place] Eric J, DO  polyethylene glycol (MIRALAX / GLYCOLAX) 17 g packet Take 17 g by mouth 2 (two) times daily.   Yes [provider]  psyllium (REGULOID) 0.52 g capsule Take 0.52 g by mouth daily.   Yes [provider]  furosemide (LASIX) 40 MG tablet Take 1 tablet (40 mg total) by mouth daily for 5 days. 06/28/19 07/03/19  [place] Donnamarie Poag, DO  potassium chloride (K-DUR) 10 MEQ tablet Take 1 tablet (10 mEq total) by mouth daily for 5 days. 06/28/19 07/03/19  [place] Eric J, DO     History reviewed. No pertinent family history.  Social History   Socioeconomic History   Marital status: Legally Separated    Spouse name: Not on file   Number of children: Not on file   Years of education: Not on file   Highest education level: Not on file  Occupational History   Not on file  Social Needs   Financial resource strain: Not  on file   Food insecurity    Worry: Not on file    Inability: Not on file   Transportation needs    Medical: Not on file    Non-medical: Not on file  Tobacco Use   Smoking status: Former Smoker    Types: Cigarettes    Quit date: 04/20/2019    Years since quitting: 0.2   Smokeless tobacco: Never Used  Substance and Sexual Activity   Alcohol use: Never    Frequency: Never   Drug use: Never   Sexual activity: Not on file  Lifestyle   Physical  activity    Days per week: Not on file    Minutes per session: Not on file   Stress: Not on file  Relationships   Social connections    Talks on phone: Not on file    Gets together: Not on file    Attends religious service: Not on file    Active member of club or organization: Not on file    Attends meetings of clubs or organizations: Not on file    Relationship status: Not on file  Other Topics Concern   Not on file  Social History Narrative   Not on file     Review of Systems: A 12 point ROS discussed and pertinent positives are indicated in the HPI above.  All other systems are negative.  Review of Systems  Constitutional: Positive for fatigue. Negative for fever.  Respiratory: Positive for shortness of breath. Negative for cough.   Cardiovascular: Negative for chest pain.  Gastrointestinal: Positive for abdominal pain. Negative for nausea and vomiting.  Psychiatric/Behavioral: Negative for behavioral problems and confusion.    Vital Signs: BP 127/76 (BP Location: Right Arm)    Pulse (!) 113    Temp 98.5 F (36.9 C) (Oral)    Resp 15    Ht 6\' 1"  (1.854 m)    Wt 174 lb (78.9 kg)    SpO2 96%    BMI 22.96 kg/m   Physical Exam Vitals signs and nursing note reviewed.  Constitutional:      Appearance: He is well-developed. He is ill-appearing.     Comments: Evidence of subcutaneous fat and muscle wasting  HENT:     Nose:     Comments: NGT in place    Mouth/Throat:     Mouth: Mucous membranes are moist.     Pharynx: Oropharynx is clear.  Cardiovascular:     Rate and Rhythm: Normal rate and regular rhythm.  Pulmonary:     Effort: Pulmonary effort is normal. No respiratory distress.     Breath sounds: Normal breath sounds.  Abdominal:     General: There is distension.     Tenderness: There is abdominal tenderness. There is no guarding.  Skin:    General: Skin is warm and dry.  Neurological:     General: No focal deficit present.     Mental Status: He is alert  and oriented to person, place, and time. Mental status is at baseline.  Psychiatric:        Mood and Affect: Mood normal.        Behavior: Behavior normal.        Thought Content: Thought content normal.        Judgment: Judgment normal.      MD Evaluation Airway: WNL Heart: WNL Abdomen: WNL Chest/ Lungs: WNL ASA  Classification: 3 Mallampati/Airway Score: One   Imaging: Dg Chest 1 View  Result Date: 06/26/2019  CLINICAL DATA:  Status post thoracentesis. EXAM: CHEST  1 VIEW COMPARISON:  06/24/2019 FINDINGS: Interval near complete evacuation of the left pleural fluid collection. A small residual effusion remains. No evidence of a postprocedural pneumothorax. Left basilar atelectasis is noted. The right lung is clear. No right-sided effusion. IMPRESSION: Interval near complete evacuation of the left pleural fluid collection with left lower lobe atelectasis. No postprocedural pneumothorax. Electronically Signed   By: Marijo Sanes M.D.   On: 06/26/2019 09:40   Dg Abd 1 View  Result Date: 06/21/2019 CLINICAL DATA:  Pt reports extreme abdominal pain across entire abdomen. Reports hx of pancreatitis. Pain with any movements. EXAM: ABDOMEN - 1 VIEW COMPARISON:  None. FINDINGS: The bowel gas pattern is normal. Cholecystectomy clips. No radio-opaque calculi or other significant radiographic abnormality are seen. IMPRESSION: Negative. Electronically Signed   By: Lucrezia Europe M.D.   On: 06/21/2019 11:04   Ct Chest W Contrast  Result Date: 07/10/2019 CLINICAL DATA:  Pleural effusion, abdominal pain, pancreatitis, increased WBC EXAM: CT CHEST, ABDOMEN, AND PELVIS WITH CONTRAST TECHNIQUE: Multidetector CT imaging of the chest, abdomen and pelvis was performed following the standard protocol during bolus administration of intravenous contrast. 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 Normal heart size. No pericardial effusion. Mediastinum/Nodes: No enlarged mediastinal, hilar, or axillary lymph nodes. Thyroid gland, trachea, and esophagus demonstrate no significant findings. Lungs/Pleura: Moderate left, small right pleural effusion with associated atelectasis or consolidation. Left pleural effusion is slightly decreased compared to prior examination. Musculoskeletal: No chest wall mass or suspicious bone lesions identified. CT ABDOMEN PELVIS FINDINGS Hepatobiliary: No solid liver abnormality is seen. Mildly coarse, nodular contour of the liver. Status post cholecystectomy. Pancreas: Unremarkable. No pancreatic ductal dilatation or surrounding inflammatory changes. Spleen: Normal in size without significant abnormality. Adrenals/Urinary Tract: Adrenal glands are unremarkable. Kidneys are normal, without renal calculi, solid lesion, or hydronephrosis. There is redemonstrated soft tissue thickening in the posterior perirenal fat, likely related to inflammatory fat necrosis and unchanged from prior. Bladder is unremarkable. Stomach/Bowel: Stomach is within normal limits. Weighted enteric feeding tube is positioned with tip in the stomach. Appendix appears normal. There are multiple loops of contrast filled although not overtly distended proximal to mid small bowel. There is gas and fluid in the colon to the rectum. Vascular/Lymphatic: Aortic atherosclerosis. Prominent mesenteric lymph nodes and/or fluid in the central small bowel mesentery unchanged from prior (series 2, image 74). Reproductive: No mass or other abnormality. Other: No abdominal wall hernia or abnormality. Redemonstrated small volume ascites. The peritoneum generally appears slightly thickened and enhancing. There are multiple redemonstrated rim enhancing fluid collections  about the abdomen. There is a new collection about the anterior left lobe of the liver and gastrohepatic ligament measuring 15.8 x 6.0 cm (series 2, image 58). There are multiple additional fluid collections which have generally decreased in size, most notably anterior to the pancreatic body and tail, measuring 12.0 x 3.3 cm, previously 14.9 x 4.3 cm when measured similarly (series 2, image 66) and within the splenogastric ligament measuring 5.8 x 1.9 cm, previously 7.9 x 4.5 cm when measured similarly (series 2, image 46). Musculoskeletal: No acute or significant osseous findings. IMPRESSION: 1. Moderate left, small right pleural effusion with associated atelectasis or consolidation. Left pleural effusion is slightly  decreased compared to prior examination. 2. Redemonstrated small volume ascites. The peritoneum generally appears slightly thickened and enhancing. 3. There are multiple redemonstrated rim enhancing fluid collections about the abdomen. There is a new collection about the anterior left lobe of the liver and gastrohepatic ligament measuring 15.8 x 6.0 cm (series 2, image 58). There are multiple additional fluid collections which have generally decreased in size, most notably anterior to the pancreatic body and tail, measuring 12.0 x 3.3 cm, previously 14.9 x 4.3 cm when measured similarly (series 2, image 66) and within the splenogastric ligament measuring 5.8 x 1.9 cm, previously 7.9 x 4.5 cm when measured similarly (series 2, image 46). Fluctuation of fluid collections may to some extent reflect redistribution of fluid within multiple communicating collections or alternately ongoing development of inflammatory fluid collections. The presence or absence of infection is not established. 4.  Other chronic and incidental findings as detailed above. Electronically Signed   By: Eddie Candle M.D.   On: 07/10/2019 16:36   Ct Chest W Contrast  Result Date: 06/25/2019 CLINICAL DATA:  Shortness of breath,  acute on chronic pancreatitis with multiple peripancreatic fluid collections, recurrent epigastric pain EXAM: CT CHEST, ABDOMEN, AND PELVIS WITH CONTRAST TECHNIQUE: Multidetector CT imaging of the chest, abdomen and pelvis was performed following the standard protocol during bolus administration of intravenous contrast. 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 Normal heart size. No pericardial effusion. Mediastinum/Nodes: No enlarged mediastinal, hilar, or axillary lymph nodes. Thyroid gland, trachea, and esophagus demonstrate no significant findings. Lungs/Pleura: There is a large left pleural effusion with associated atelectasis or consolidation, enlarged compared to prior examination. There is a new, trace right pleural effusion. There are scattered subpleural ground-glass pulmonary opacities, most conspicuous in the right upper lobe. Musculoskeletal: No chest wall mass or suspicious bone lesions identified. CT ABDOMEN PELVIS FINDINGS Hepatobiliary: No focal liver abnormality is seen. Status post cholecystectomy. No biliary dilatation. Pancreas: Redemonstrated extensive retroperitoneal inflammation and multiple peripancreatic fluid collections. There is a new discrete fluid collection or component adjacent to the greater curvature of the stomach and spleen measuring approximately 6.2 x 2.7 cm (series 3, image 52). There is an additional new fluid collection within or adjacent to the omentum measuring 4.7 x 2.5 cm (series 3, image 75). There is an additional new fluid collection within the small bowel mesentery measuring 5.0 x 3.2 cm (series 3, image 86). Other fluid collections are not significantly changed, for example anterior to the pancreatic neck (series 3, image 67), adjacent to the porta hepatis (series 3, image  67), adjacent to the left kidney and splenic flexure (series 3, image 73), and posterior to the left kidney (series 3, image 65). Spleen: Splenomegaly, maximum span 14.4 cm. Adrenals/Urinary Tract: Adrenal glands are unremarkable. Kidneys are normal, without renal calculi, solid lesion, or hydronephrosis. Bladder is unremarkable. Stomach/Bowel: Stomach is within normal limits. Appendix appears normal. Inflammatory thickening of the transverse colon (series 3, image 81), similar to prior examination, and of the mid small bowel, particularly a segment in the anterior abdomen (series 3, image 95). Vascular/Lymphatic: Aortic atherosclerosis. No enlarged abdominal or pelvic lymph nodes. Reproductive: No mass or other abnormality. Other: Extensive anasarca. Small volume ascites, similar to prior examination. Musculoskeletal: No acute or significant osseous findings. IMPRESSION: 1. There is a large left pleural effusion with associated atelectasis or consolidation, enlarged compared to  prior examination. There is a new, trace right pleural effusion. 2. There are scattered subpleural ground-glass pulmonary opacities, most conspicuous in the right upper lobe. These are nonspecific and infectious or inflammatory. 3. Redemonstrated extensive retroperitoneal inflammation and multiple peripancreatic fluid collections. There is a new discrete fluid collection or component adjacent to the greater curvature of the stomach and spleen measuring approximately 6.2 x 2.7 cm (series 3, image 52). There is an additional new fluid collection within or adjacent to the omentum measuring 4.7 x 2.5 cm (series 3, image 75). There is an additional new fluid collection within the small bowel mesentery measuring 5.0 x 3.2 cm (series 3, image 86). 4. Other fluid collections are not significantly changed, for example anterior to the pancreatic neck (series 3, image 67), adjacent to the porta hepatis (series 3, image 67), adjacent to the left kidney  and splenic flexure (series 3, image 73), and posterior to the left kidney (series 3, image 65). 5. Inflammatory thickening of the transverse colon (series 3, image 81), similar to prior examination, and of the mid small bowel, particularly a segment in the anterior abdomen (series 3, image 95). 6. Extensive anasarca. Small volume ascites, similar to prior examination. Electronically Signed   By: Eddie Candle M.D.   On: 06/25/2019 14:18   Ct Angio Chest Pe W/cm &/or Wo Cm  Result Date: 07/05/2019 CLINICAL DATA:  Chest pain.  Complicated pancreatitis. EXAM: CT ANGIOGRAPHY CHEST WITH CONTRAST TECHNIQUE: Multidetector CT imaging of the chest was performed using the standard protocol during bolus administration of intravenous contrast. Multiplanar CT image reconstructions and MIPs were obtained to evaluate the vascular anatomy. CONTRAST:  112mL OMNIPAQUE IOHEXOL 350 MG/ML SOLN COMPARISON:  Chest CT from 10 days ago FINDINGS: Cardiovascular: Satisfactory opacification of the pulmonary arteries to the segmental level. No evidence of pulmonary embolism when accounting for intermittent motion artifact. Normal heart size. No pericardial effusion. Aberrant right subclavian artery Mediastinum/Nodes: Negative for adenopathy or mass. Lungs/Pleura: Large left pleural effusion that is layering. Trace right pleural fluid. Near complete collapse of the left lower lobe. No consolidation or edema. Upper Abdomen: As reported separately. Musculoskeletal: No acute or aggressive finding Review of the MIP images confirms the above findings. IMPRESSION: 1. Negative for pulmonary embolism. 2. Large layering left pleural effusion with near complete lower lobe collapse. Electronically Signed   By: Monte Fantasia M.D.   On: 07/05/2019 07:16   Ct Abdomen Pelvis W Contrast  Result Date: 07/10/2019 CLINICAL DATA:  Pleural effusion, abdominal pain, pancreatitis, increased WBC EXAM: CT CHEST, ABDOMEN, AND PELVIS WITH CONTRAST TECHNIQUE:  Multidetector CT imaging of the chest, abdomen and pelvis was performed following the standard protocol during bolus administration of intravenous contrast. 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 Normal heart size. No pericardial effusion. Mediastinum/Nodes: No enlarged mediastinal, hilar, or axillary lymph nodes. Thyroid gland, trachea, and esophagus demonstrate no significant findings. Lungs/Pleura: Moderate left, small right pleural effusion with associated atelectasis or consolidation. Left pleural effusion is slightly decreased compared to prior examination. Musculoskeletal: No chest wall mass or suspicious bone lesions identified. CT ABDOMEN PELVIS FINDINGS Hepatobiliary: No solid liver abnormality is seen. Mildly coarse, nodular contour of the liver. Status post cholecystectomy. Pancreas: Unremarkable. No pancreatic ductal dilatation or surrounding inflammatory changes. Spleen: Normal in size  without significant abnormality. Adrenals/Urinary Tract: Adrenal glands are unremarkable. Kidneys are normal, without renal calculi, solid lesion, or hydronephrosis. There is redemonstrated soft tissue thickening in the posterior perirenal fat, likely related to inflammatory fat necrosis and unchanged from prior. Bladder is unremarkable. Stomach/Bowel: Stomach is within normal limits. Weighted enteric feeding tube is positioned with tip in the stomach. Appendix appears normal. There are multiple loops of contrast filled although not overtly distended proximal to mid small bowel. There is gas and fluid in the colon to the rectum. Vascular/Lymphatic: Aortic atherosclerosis. Prominent mesenteric lymph nodes and/or fluid in the central small  bowel mesentery unchanged from prior (series 2, image 74). Reproductive: No mass or other abnormality. Other: No abdominal wall hernia or abnormality. Redemonstrated small volume ascites. The peritoneum generally appears slightly thickened and enhancing. There are multiple redemonstrated rim enhancing fluid collections about the abdomen. There is a new collection about the anterior left lobe of the liver and gastrohepatic ligament measuring 15.8 x 6.0 cm (series 2, image 58). There are multiple additional fluid collections which have generally decreased in size, most notably anterior to the pancreatic body and tail, measuring 12.0 x 3.3 cm, previously 14.9 x 4.3 cm when measured similarly (series 2, image 66) and within the splenogastric ligament measuring 5.8 x 1.9 cm, previously 7.9 x 4.5 cm when measured similarly (series 2, image 46). Musculoskeletal: No acute or significant osseous findings. IMPRESSION: 1. Moderate left, small right pleural effusion with associated atelectasis or consolidation. Left pleural effusion is slightly decreased compared to prior examination. 2. Redemonstrated small volume ascites. The peritoneum generally appears slightly thickened and enhancing. 3. There are multiple redemonstrated rim enhancing fluid collections about the abdomen. There is a new collection about the anterior left lobe of the liver and gastrohepatic ligament measuring 15.8 x 6.0 cm (series 2, image 58). There are multiple additional fluid collections which have generally decreased in size, most notably anterior to the pancreatic body and tail, measuring 12.0 x 3.3 cm, previously 14.9 x 4.3 cm when measured similarly (series 2, image 66) and within the splenogastric ligament measuring 5.8 x 1.9 cm, previously 7.9 x 4.5 cm when measured similarly (series 2, image 46). Fluctuation of fluid collections may to some extent reflect redistribution of fluid within multiple communicating collections or alternately ongoing  development of inflammatory fluid collections. The presence or absence of infection is not established. 4.  Other chronic and incidental findings as detailed above. Electronically Signed   By: Eddie Candle M.D.   On: 07/10/2019 16:36   Ct Abdomen Pelvis W Contrast  Result Date: 06/25/2019 CLINICAL DATA:  Shortness of breath, acute on chronic pancreatitis with multiple peripancreatic fluid collections, recurrent epigastric pain EXAM: CT CHEST, ABDOMEN, AND PELVIS WITH CONTRAST TECHNIQUE: Multidetector CT imaging of the chest, abdomen and pelvis was performed following the standard protocol during bolus administration of intravenous contrast. 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 Normal heart size. No pericardial effusion. Mediastinum/Nodes: No enlarged mediastinal, hilar, or axillary lymph nodes. Thyroid gland, trachea, and esophagus demonstrate no significant findings. Lungs/Pleura: There is a large left pleural effusion with associated atelectasis or consolidation, enlarged compared to prior examination. There is a new, trace right pleural effusion. There are scattered subpleural ground-glass pulmonary opacities, most conspicuous in the right upper lobe. Musculoskeletal: No chest wall mass or suspicious bone lesions identified. CT ABDOMEN PELVIS FINDINGS  Hepatobiliary: No focal liver abnormality is seen. Status post cholecystectomy. No biliary dilatation. Pancreas: Redemonstrated extensive retroperitoneal inflammation and multiple peripancreatic fluid collections. There is a new discrete fluid collection or component adjacent to the greater curvature of the stomach and spleen measuring approximately 6.2 x 2.7 cm (series 3, image 52). There is an additional new fluid collection within or adjacent to the omentum  measuring 4.7 x 2.5 cm (series 3, image 75). There is an additional new fluid collection within the small bowel mesentery measuring 5.0 x 3.2 cm (series 3, image 86). Other fluid collections are not significantly changed, for example anterior to the pancreatic neck (series 3, image 67), adjacent to the porta hepatis (series 3, image 67), adjacent to the left kidney and splenic flexure (series 3, image 73), and posterior to the left kidney (series 3, image 65). Spleen: Splenomegaly, maximum span 14.4 cm. Adrenals/Urinary Tract: Adrenal glands are unremarkable. Kidneys are normal, without renal calculi, solid lesion, or hydronephrosis. Bladder is unremarkable. Stomach/Bowel: Stomach is within normal limits. Appendix appears normal. Inflammatory thickening of the transverse colon (series 3, image 81), similar to prior examination, and of the mid small bowel, particularly a segment in the anterior abdomen (series 3, image 95). Vascular/Lymphatic: Aortic atherosclerosis. No enlarged abdominal or pelvic lymph nodes. Reproductive: No mass or other abnormality. Other: Extensive anasarca. Small volume ascites, similar to prior examination. Musculoskeletal: No acute or significant osseous findings. IMPRESSION: 1. There is a large left pleural effusion with associated atelectasis or consolidation, enlarged compared to prior examination. There is a new, trace right pleural effusion. 2. There are scattered subpleural ground-glass pulmonary opacities, most conspicuous in the right upper lobe. These are nonspecific and infectious or inflammatory. 3. Redemonstrated extensive retroperitoneal inflammation and multiple peripancreatic fluid collections. There is a new discrete fluid collection or component adjacent to the greater curvature of the stomach and spleen measuring approximately 6.2 x 2.7 cm (series 3, image 52). There is an additional new fluid collection within or adjacent to the omentum measuring 4.7 x 2.5 cm (series 3,  image 75). There is an additional new fluid collection within the small bowel mesentery measuring 5.0 x 3.2 cm (series 3, image 86). 4. Other fluid collections are not significantly changed, for example anterior to the pancreatic neck (series 3, image 67), adjacent to the porta hepatis (series 3, image 67), adjacent to the left kidney and splenic flexure (series 3, image 73), and posterior to the left kidney (series 3, image 65). 5. Inflammatory thickening of the transverse colon (series 3, image 81), similar to prior examination, and of the mid small bowel, particularly a segment in the anterior abdomen (series 3, image 95). 6. Extensive anasarca. Small volume ascites, similar to prior examination. Electronically Signed   By: Eddie Candle M.D.   On: 06/25/2019 14:18   Ct Abdomen Pelvis W Contrast  Result Date: 06/20/2019 CLINICAL DATA:  Abdominal pain.  Neutropenia. EXAM: CT ABDOMEN AND PELVIS WITH CONTRAST TECHNIQUE: Multidetector CT imaging of the abdomen and pelvis was performed using the standard protocol following bolus administration of intravenous contrast. CONTRAST:  139mL OMNIPAQUE IOHEXOL 300 MG/ML  SOLN COMPARISON:  None. FINDINGS: Lower chest: There is a moderate-sized left-sided pleural effusion with near complete collapse of the left lower lobe.The heart size is normal. Hepatobiliary: The liver is normal. Status post cholecystectomy.There is no biliary ductal dilation. Pancreas: There are multiple peripancreatic fluid collections the largest of which measures approximately 3.7 by 1.8 cm. The pancreas appears to enhance symmetrically. Spleen: The  spleen is enlarged measuring approximately 14 cm craniocaudad. Adrenals/Urinary Tract: --Adrenal glands: No adrenal hemorrhage. --Right kidney/ureter: No hydronephrosis or perinephric hematoma. --Left kidney/ureter: There is no left-sided hydronephrosis. There is a complex collection in the left posterior pararenal space measuring approximately 8.5 x 2.5  cm. --Urinary bladder: Unremarkable. Stomach/Bowel: --Stomach/Duodenum: There is some wall thickening of the stomach. --Small bowel: No dilatation or inflammation. --Colon: There are soft tissue densities along the descending colon measuring approximately 2.8 x 2.4 cm. There is a collection of the splenic flexure measuring 4.7 x 2.7 cm causing mass effect on the nearby:Marland Kitchen There is wall thickening of the transverse colon without evidence of an obstruction. --Appendix: Not visualized. No right lower quadrant inflammation or free fluid. Vascular/Lymphatic: Atherosclerotic changes are noted of the abdominal aorta without evidence of an abdominal aortic aneurysm. The portal vein and splenic vein remain patent. The splenic artery remains patent. --No retroperitoneal lymphadenopathy. --No mesenteric lymphadenopathy. --No pelvic or inguinal lymphadenopathy. Reproductive: Unremarkable Other: There is a small volume of free fluid in the pelvis. There are multiple scattered collections in the retroperitoneum and peritoneal cavity. A few these collections demonstrate mild rim enhancement. For example in the left upper quadrant there is a 3.9 x 1.9 cm collection that demonstrates mild peripheral rim enhancement. There is a 3.8 by 4.4 cm collection in the region of the gallbladder fossa. Given the surgical clips in the gallbladder fossa this is favored to represent a loculated fluid collection as opposed to a remnant gallbladder. Musculoskeletal. No acute displaced fractures. IMPRESSION: 1. Overall findings concerning for pancreatitis with multiple loculated fluid collections as detailed above. Some of the smaller collections, for example in the left upper quadrant, demonstrate rim enhancement. An abscess is not excluded. There is no CT evidence for pancreatic necrosis. 2. Moderate-sized left-sided pleural effusion with at least partial collapse of the left lower lobe. 3. Diffuse wall thickening of the transverse colon and splenic  flexure favored to be reactive. Other considerations include infectious or inflammatory colitis. 4. Splenomegaly.  The splenic vein remains patent. Electronically Signed   By: Constance Holster M.D.   On: 06/20/2019 21:07   Dg Chest Port 1 View  Result Date: 07/10/2019 CLINICAL DATA:  Leukocytosis EXAM: PORTABLE CHEST 1 VIEW COMPARISON:  None. FINDINGS: Feeding tube tip is in the distal esophagus. There is a small left pleural effusion with patchy consolidation in the left base. There is atelectatic change in each lung base as well. Heart is upper normal in size with pulmonary vascularity normal. No adenopathy. No bone lesions. IMPRESSION: Feeding tube tip in distal esophagus. Persistent patchy consolidation left base with bibasilar atelectasis. Small left pleural effusion. Stable cardiac silhouette. These results will be called to the ordering clinician or representative by the Radiologist Assistant, and communication documented in the PACS or zVision Dashboard. Electronically Signed   By: Lowella Grip III M.D.   On: 07/10/2019 08:16   Dg Chest Port 1 View  Result Date: 07/05/2019 CLINICAL DATA:  Status post left-sided thoracentesis EXAM: PORTABLE CHEST 1 VIEW COMPARISON:  July 05, 2019 FINDINGS: The left-sided pleural effusion has significantly decreased in size from the prior study. There is no evidence for left-sided pneumothorax. Bibasilar airspace opacities are noted, left worse than right. The lung volumes are low. The heart size is stable from prior study. IMPRESSION: 1. No pneumothorax status post left-sided thoracentesis. 2. Significant interval decrease in size of the left-sided pleural effusion. 3. Bibasilar airspace opacities, left greater than right, favored to represent atelectasis. 4.  Low lung volumes. Electronically Signed   By: Constance Holster M.D.   On: 07/05/2019 16:31   Dg Chest Portable 1 View  Result Date: 07/05/2019 CLINICAL DATA:  Chest pain. EXAM: PORTABLE CHEST 1  VIEW COMPARISON:  Radiograph 06/26/2019, CT 06/25/2019 FINDINGS: Re-accumulation of left pleural effusion after thoracentesis. Pleural effusion is moderate in size with associated underlying left lung base atelectasis/consolidation. Possible small right pleural effusion. Unchanged heart size and mediastinal contours. Increasing patchy right lung base opacity. No pneumothorax. IMPRESSION: 1. Re-accumulation of left pleural effusion after thoracentesis, now moderate in size. Associated left lung base atelectasis/consolidation. 2. Possible small right pleural effusion. Increasing right lung base opacity may be atelectasis or pneumonia. Electronically Signed   By: Keith Rake M.D.   On: 07/05/2019 03:22   Dg Chest Port 1 View  Result Date: 06/24/2019 CLINICAL DATA:  Chest pain for 1 hour EXAM: PORTABLE CHEST 1 VIEW COMPARISON:  06/20/2019 FINDINGS: Left-sided pleural effusion is again seen and slightly enlarged when compare with the prior study. Likely underlying atelectasis/infiltrate is present in the left base. The right lung is clear. No bony abnormality is noted. IMPRESSION: Increasing left-sided pleural effusion. Electronically Signed   By: Inez Catalina M.D.   On: 06/24/2019 19:33   Dg Chest Portable 1 View  Result Date: 06/20/2019 CLINICAL DATA:  Chest pain EXAM: PORTABLE CHEST 1 VIEW COMPARISON:  None. FINDINGS: There is airspace consolidation in the left base with left pleural effusion. Lungs elsewhere are clear. Heart size and pulmonary vascularity are normal. No adenopathy. No bone lesions. IMPRESSION: Left lower lobe airspace consolidation consistent with pneumonia. Small left pleural effusion. Lungs elsewhere clear. No adenopathy. Heart size normal. Followup PA and lateral chest radiographs recommended in 3-4 weeks following trial of antibiotic therapy to ensure resolution and exclude underlying malignancy. Electronically Signed   By: Lowella Grip III M.D.   On: 06/20/2019 19:32   Dg Abd  Portable 1v  Result Date: 07/11/2019 CLINICAL DATA:  Per RN-patient called that he moved to use the bathroom and he displaced the core track feeding tube from 70 mark to 35. I advanced it back but he is c/o pain and says he feels its is displaced." EXAM: PORTABLE ABDOMEN - 1 VIEW COMPARISON:  Radiograph yesterday at 10:51 a.m. FINDINGS: Tip of the weighted enteric tube is in the left upper quadrant below the diaphragm, in the pre pyloric stomach. Surgical clips in the right abdomen. IMPRESSION: Tip of the weighted enteric tube below the diaphragm in the pre pyloric stomach. Electronically Signed   By: Keith Rake M.D.   On: 07/11/2019 00:45   Dg Abd Portable 1v  Result Date: 07/10/2019 CLINICAL DATA:  Check feeding catheter placement EXAM: PORTABLE ABDOMEN - 1 VIEW COMPARISON:  07/09/2019 FINDINGS: Weighted feeding catheter is noted with the tip in the distal stomach. Mildly dilated small bowel is noted similar to that seen on the prior exam. IMPRESSION: Feeding catheter within the distal stomach. Electronically Signed   By: Inez Catalina M.D.   On: 07/10/2019 11:38   Dg Abd Portable 1v  Result Date: 07/09/2019 CLINICAL DATA:  Feeding tube placement EXAM: PORTABLE ABDOMEN - 1 VIEW COMPARISON:  07/05/2019 FINDINGS: Bibasilar pleural effusions and airspace disease. Esophageal tube tip overlies the proximal stomach. Dilated small bowel in the central abdomen. IMPRESSION: Esophageal tube tip overlies the proximal stomach. Electronically Signed   By: Donavan Foil M.D.   On: 07/09/2019 19:32   Ct Renal Stone Study  Result Date: 07/05/2019 CLINICAL  DATA:  Chest pain. EXAM: CT ABDOMEN AND PELVIS WITHOUT CONTRAST TECHNIQUE: Multidetector CT imaging of the abdomen and pelvis was performed following the standard protocol without IV contrast. COMPARISON:  Fifteen days ago FINDINGS: Lower chest: At least moderate left pleural effusion with multi segment atelectasis. Pending chest CT. Hepatobiliary: No focal  liver abnormality.Cholecystectomy. Pancreas: History of recurrent pancreatitis. Progressive organized collections: 1. 8 x 4 cm along the upper greater curvature of the stomach 2. 6 cm along the mid greater curvature of the stomach-which may be connected to #1. 3. Peripancreatic to splenic tail, 15 x 4 by up to 6 cm-with further extent tracking inferiorly along the descending colon. 4. Ligamentum venosum of the liver measuring 5 x 3 cm 5. Ventral midline abdomen at 5 cm 6. Lower ventral midline abdomen with interloop distortion, 9 cm. These collections exert mass effect on the stomach, transverse colon, and descending colon. Fat density with rim of stranding posterior to the left kidney in the posterior pararenal space, stable in compatible with fat necrosis. There is generalized retroperitoneal edema about the collections and pancreas. No gas within any of the collections to suggest fistula or definite superinfection. Spleen: Stable size and density. Adrenals/Urinary Tract: Negative adrenals. No hydronephrosis or stone. Unremarkable bladder. Stomach/Bowel: No obstruction. Low-density about the hepatic flexure is likely a combination of reactive wall thickening and pericolonic fluid. Vascular/Lymphatic: No visible acute vascular abnormality. No noted adenopathy. Reproductive:Negative Other: Right upper quadrant ascites which may be loculated. Musculoskeletal: No acute abnormalities. IMPRESSION: 1. Recent pancreatitis with multiple organized fluid collections in the abdomen as listed and measured above. The largest measures 15 x 4 x 6 cm. Collections exert mass effect on the stomach, transverse colon, descending colon without obstruction. 2. Stable left posterior pararenal fat necrosis. 3. At least moderate left pleural effusion with multi segment atelectasis. Electronically Signed   By: Monte Fantasia M.D.   On: 07/05/2019 07:12   Ir Thoracentesis Asp Pleural Space W/img Guide  Result Date: 06/26/2019 INDICATION:  Shortness of breath. Left-sided pleural effusion. Request for diagnostic and therapeutic thoracentesis. EXAM: ULTRASOUND GUIDED LEFT THORACENTESIS MEDICATIONS: None. COMPLICATIONS: None immediate. PROCEDURE: An ultrasound guided thoracentesis was thoroughly discussed with the patient and questions answered. The benefits, risks, alternatives and complications were also discussed. The patient understands and wishes to proceed with the procedure. Written consent was obtained. Ultrasound was performed to localize and mark an adequate pocket of fluid in the left chest. The area was then prepped and draped in the normal sterile fashion. 1% Lidocaine was used for local anesthesia. Under ultrasound guidance a 6 Fr Safe-T-Centesis catheter was introduced. Thoracentesis was performed. The catheter was removed and a dressing applied. FINDINGS: A total of approximately 1.3 L of hazy yellow fluid was removed. Samples were sent to the laboratory as requested by the clinical team. IMPRESSION: Successful ultrasound guided left thoracentesis yielding 1.3 L of pleural fluid. Read by: Ascencion Dike PA-C Electronically Signed   By: Markus Daft M.D.   On: 06/26/2019 09:19   US Thoracentesis Asp Pleural Space W/img Guide  Result Date: 07/05/2019 INDICATION: Patient with history of recurrent pancreatitis, GERD, dyspnea, recurrent left pleural effusion. Request made for diagnostic and therapeutic left thoracentesis. EXAM: ULTRASOUND GUIDED DIAGNOSTIC AND THERAPEUTIC LEFT THORACENTESIS MEDICATIONS: None COMPLICATIONS: None immediate. PROCEDURE: An ultrasound guided thoracentesis was thoroughly discussed with the patient and questions answered. The benefits, risks, alternatives and complications were also discussed. The patient understands and wishes to proceed with the procedure. Written consent was obtained. Ultrasound was  performed to localize and mark an adequate pocket of fluid in the left chest. The area was then prepped and draped  in the normal sterile fashion. 1% Lidocaine was used for local anesthesia. Under ultrasound guidance a 6 Fr Safe-T-Centesis catheter was introduced. Thoracentesis was performed. The catheter was removed and a dressing applied. FINDINGS: A total of approximately 770 cc of yellow fluid was removed. Samples were sent to the laboratory as requested by the clinical team. IMPRESSION: Successful ultrasound guided diagnostic and therapeutic left thoracentesis yielding 770 cc of pleural fluid. Read by: Rowe Robert, PA-C Electronically Signed   By: Markus Daft M.D.   On: 07/05/2019 16:12    Labs:  CBC: Recent Labs    07/08/19 0403 07/09/19 0420 07/10/19 0521 07/10/19 1254  WBC 21.9* 20.4* 28.0* 32.2*  HGB 10.3* 9.8* 10.8* 10.5*  HCT 33.3* 31.6* 34.6* 33.6*  PLT 662* 564* 676* 663*    COAGS: Recent Labs    07/05/19 0420  INR 1.2  APTT 32    BMP: Recent Labs    07/08/19 0403 07/09/19 0420 07/10/19 0521 07/11/19 0443  NA 130* 128* 131* 130*  K 4.0 3.7 3.9 3.8  CL 94* 93* 93* 93*  CO2 27 27 29 30   GLUCOSE 106* 94 124* 114*  BUN 12 10 10 11   CALCIUM 8.3* 8.0* 8.3* 7.9*  CREATININE 0.51* 0.47* 0.49* 0.46*  GFRNONAA >60 >60 >60 >60  GFRAA >60 >60 >60 >60    LIVER FUNCTION TESTS: Recent Labs    07/06/19 0212 07/07/19 0221 07/08/19 0403 07/11/19 0443  BILITOT 0.5 0.7 0.8 0.5  AST 14* 14* 25 36  ALT 20 16 20  34  ALKPHOS 102 107 139* 172*  PROT 5.9* 6.1* 5.8* 5.4*  ALBUMIN 1.8* 1.8* 1.6* 1.4*    TUMOR MARKERS: No results for input(s): AFPTM, CEA, CA199, CHROMGRNA in the last 8760 hours.  Assessment and Plan: Idiopathic pancreatitis, developing pseudocyst, elevated white count Patient with acute on chronic pancreatitis, developing pseudocyst.  Patient is awaiting transfer to [facility] once bed is available.  In the interim he has had further elevation of his white blood cell count. IR consulted for aspiration and drainage of pseudocyst. Patient currently remains  afebrile.  His vital signs are stable.  No signs of sepsis. Patient reports his pain is well controlled.  He is on PCA pump.  He has been able to get out of bed with assistance from PT. He did have a exudative thoracentesis 8/16.  He is easily short of breath.  No recent concerns noted on Chest CT 07/10/2019.  Dr. Kathlene Cote has reviewed patient's history and case as well as imaging which shows a new collection about the anterior left lobe of the liver as well as the continued presence of multiple abdominal fluid collections.  The patient does have a worsening WBC in the setting of inflammation, pancreatitis, and possible infection, however his vital signs remain stable and his pain is well controlled.  Discussed the possibility of aspiration and drainage with patient.  He states he would like to avoid a drain if at all possible.  He is aware that chronic drainage may occur.  He understands this may be unavoidable if he develops further signs of infection however he reports a plan is in place with his primary team at [facility] and he would like to continue with this plan if possible. This is reasonable given his current stability.   Agree with transfer to [facility] IR remains available if needed.  We will try to avoid drainage if we can for this patient.  Will monitor status, vital signs, and care plan.  Thank you for this interesting consult.  I greatly enjoyed meeting Kyle Mooney and look forward to participating in their care.  A copy of this report was sent to the requesting provider on this date.  Electronically Signed: Docia Barrier, PA 07/11/2019, 3:31 PM   I spent a total of 40 Minutes    in face to face in clinical consultation, greater than 50% of which was counseling/coordinating care for pancreatic pseudocyst.

## 2019-07-11 NOTE — Progress Notes (Signed)
Pt was sitting up in bed and awake when I arrived. He spoke of how he has been getting a lot of negative news about his health lately. He talked about being scared and lonely. He said he has a brother who visits occasionally.  Pt said he was short of breath (but said he did not need a nurse when asked) and therefore I did not encourage him to talk too much. He talked about his faith and churches he attended prior to becoming sick. Pt wanted prayer and was very appreciative. Please page if additional assistance is needed.  [place], [place]   07/11/19 1600  Clinical Encounter Type  Visited With Patient

## 2019-07-11 NOTE — Progress Notes (Signed)
Pt ambulated in hallway and was accompanied by nursing staff. Pt verbalized is depressed since diagnosis 12 weeks ago. Recently a few days ago pt wife left him. Pt verbalized to RN that he would like for the chaplain to come by. Chaplain paged and will be coming by in the next 30 minutes.

## 2019-07-11 NOTE — Progress Notes (Signed)
UNASSIGNED PATIENT Subjective: Since I last evaluated the patient, there has not been much change in his overall condition.  He continues to complain of pain in his neck from the feeding tube.  He has a complicated history with a recurrent pancreatitis and pseudocyst with multiple fluid collections in the abdomen.  His white count is increased from 20 8K to 30 2.2K today he is awaiting transfer to [facility] as he is followed his primary gastroenterologist there.  Objective: Vital signs in last 24 hours: Temp:  [97.9 F (36.6 C)-98.4 F (36.9 C)] 97.9 F (36.6 C) (08/22 0610) Pulse Rate:  [98-118] 101 (08/22 0610) Resp:  [17-22] 18 (08/22 0610) BP: (111-131)/(75-85) 124/75 (08/22 0610) SpO2:  [93 %-98 %] 93 % (08/22 0610) FiO2 (%):  [32 %-33 %] 32 % (08/21 1210) Last BM Date: 07/11/19  Intake/Output from previous day: 08/21 0701 - 08/22 0700 In: 1659.5 [P.O.:594; I.V.:27.7; NG/GT:662; IV Piggyback:375.9] Out: 700 [Urine:700] Intake/Output this shift: No intake/output data recorded.  General appearance: alert, cooperative, appears older than stated age, fatigued, mild distress and pale Resp: clear to auscultation bilaterally Cardio: regular rate and rhythm, S1, S2 normal, no murmur, click, rub or gallop GI: soft, mild periumbilical and right upper quadrant tenderness on palpation with guarding but without rebound or rigidity; bowel sounds normal; no masses,  no organomegaly Extremities: extremities normal, atraumatic, no cyanosis or edema  Lab Results: Recent Labs    07/09/19 0420 07/10/19 0521 07/10/19 1254  WBC 20.4* 28.0* 32.2*  HGB 9.8* 10.8* 10.5*  HCT 31.6* 34.6* 33.6*  PLT 564* 676* 663*   BMET Recent Labs    07/09/19 0420 07/10/19 0521 07/11/19 0443  NA 128* 131* 130*  K 3.7 3.9 3.8  CL 93* 93* 93*  CO2 27 29 30   GLUCOSE 94 124* 114*  BUN 10 10 11   CREATININE 0.47* 0.49* 0.46*  CALCIUM 8.0* 8.3* 7.9*   LFT Recent Labs    07/11/19 0443   PROT 5.4*  ALBUMIN 1.4*  AST 36  ALT 34  ALKPHOS 172*  BILITOT 0.5   Studies/Results: Ct Chest W Contrast  Result Date: 07/10/2019 CLINICAL DATA:  Pleural effusion, abdominal pain, pancreatitis, increased WBC EXAM: CT CHEST, ABDOMEN, AND PELVIS WITH CONTRAST TECHNIQUE: Multidetector CT imaging of the chest, abdomen and pelvis was performed following the standard protocol during bolus administration of intravenous contrast. 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 Normal heart size. No pericardial effusion. Mediastinum/Nodes: No enlarged mediastinal, hilar, or axillary lymph nodes. Thyroid gland, trachea, and esophagus demonstrate no significant findings. Lungs/Pleura: Moderate left, small right pleural effusion with associated atelectasis or consolidation. Left pleural effusion is slightly decreased compared to prior examination. Musculoskeletal: No chest wall mass or suspicious bone lesions identified. CT ABDOMEN PELVIS FINDINGS Hepatobiliary: No solid liver abnormality is seen. Mildly coarse, nodular contour of the liver. Status post cholecystectomy. Pancreas: Unremarkable. No pancreatic ductal dilatation or surrounding inflammatory changes. Spleen: Normal in size without significant abnormality. Adrenals/Urinary Tract: Adrenal glands are unremarkable. Kidneys are normal, without renal calculi, solid lesion, or hydronephrosis. There is redemonstrated soft tissue thickening in the posterior perirenal fat, likely related to inflammatory fat necrosis and unchanged from prior. Bladder is unremarkable. Stomach/Bowel: Stomach is within normal limits. Weighted enteric feeding tube is positioned with  tip in the stomach. Appendix  appears normal. There are multiple loops of contrast filled although not overtly distended proximal to mid small bowel. There is gas and fluid in the colon to the rectum. Vascular/Lymphatic: Aortic atherosclerosis. Prominent mesenteric lymph nodes and/or fluid in the central small bowel mesentery unchanged from prior (series 2, image 74). Reproductive: No mass or other abnormality. Other: No abdominal wall hernia or abnormality. Redemonstrated small volume ascites. The peritoneum generally appears slightly thickened and enhancing. There are multiple redemonstrated rim enhancing fluid collections about the abdomen. There is a new collection about the anterior left lobe of the liver and gastrohepatic ligament measuring 15.8 x 6.0 cm (series 2, image 58). There are multiple additional fluid collections which have generally decreased in size, most notably anterior to the pancreatic body and tail, measuring 12.0 x 3.3 cm, previously 14.9 x 4.3 cm when measured similarly (series 2, image 66) and within the splenogastric ligament measuring 5.8 x 1.9 cm, previously 7.9 x 4.5 cm when measured similarly (series 2, image 46). Musculoskeletal: No acute or significant osseous findings. IMPRESSION: 1. Moderate left, small right pleural effusion with associated atelectasis or consolidation. Left pleural effusion is slightly decreased compared to prior examination. 2. Redemonstrated small volume ascites. The peritoneum generally appears slightly thickened and enhancing. 3. There are multiple redemonstrated rim enhancing fluid collections about the abdomen. There is a new collection about the anterior left lobe of the liver and gastrohepatic ligament measuring 15.8 x 6.0 cm (series 2, image 58). There are multiple additional fluid collections which have generally decreased in size, most notably anterior to the pancreatic body and tail, measuring 12.0 x 3.3 cm, previously 14.9 x 4.3 cm when measured similarly (series 2, image 66) and  within the splenogastric ligament measuring 5.8 x 1.9 cm, previously 7.9 x 4.5 cm when measured similarly (series 2, image 46). Fluctuation of fluid collections may to some extent reflect redistribution of fluid within multiple communicating collections or alternately ongoing development of inflammatory fluid collections. The presence or absence of infection is not established. 4.  Other chronic and incidental findings as detailed above. Electronically Signed   By: Eddie Candle M.D.   On: 07/10/2019 16:36   Ct Abdomen Pelvis W Contrast  Result Date: 07/10/2019 CLINICAL DATA:  Pleural effusion, abdominal pain, pancreatitis, increased WBC EXAM: CT CHEST, ABDOMEN, AND PELVIS WITH CONTRAST TECHNIQUE: Multidetector CT imaging of the chest, abdomen and pelvis was performed following the standard protocol during bolus administration of intravenous contrast. 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 Normal heart size. No pericardial effusion. Mediastinum/Nodes: No enlarged mediastinal, hilar, or axillary lymph nodes. Thyroid gland, trachea, and esophagus demonstrate no significant findings. Lungs/Pleura: Moderate left, small right pleural effusion with associated atelectasis or consolidation. Left pleural effusion is slightly decreased compared to prior examination. Musculoskeletal: No chest wall mass or suspicious bone lesions identified. CT ABDOMEN PELVIS FINDINGS Hepatobiliary: No solid liver abnormality is seen. Mildly coarse, nodular contour of the liver. Status post cholecystectomy. Pancreas: Unremarkable. No pancreatic ductal dilatation or surrounding inflammatory changes. Spleen: Normal in size without significant abnormality.  Adrenals/Urinary Tract: Adrenal glands are unremarkable. Kidneys are normal, without renal calculi, solid lesion, or hydronephrosis. There is redemonstrated soft tissue thickening in the posterior perirenal fat, likely related to inflammatory fat necrosis  and unchanged from prior. Bladder is unremarkable. Stomach/Bowel: Stomach is within normal limits. Weighted enteric feeding tube is positioned with tip in the stomach. Appendix appears normal. There are multiple loops of contrast filled although not overtly distended proximal to mid small bowel. There is gas and fluid in the colon to the rectum. Vascular/Lymphatic: Aortic atherosclerosis. Prominent mesenteric lymph nodes and/or fluid in the central small bowel mesentery unchanged from prior (series 2, image 74). Reproductive: No mass or other abnormality. Other: No abdominal wall hernia or abnormality. Redemonstrated small volume ascites. The peritoneum generally appears slightly thickened and enhancing. There are multiple redemonstrated rim enhancing fluid collections about the abdomen. There is a new collection about the anterior left lobe of the liver and gastrohepatic ligament measuring 15.8 x 6.0 cm (series 2, image 58). There are multiple additional fluid collections which have generally decreased in size, most notably anterior to the pancreatic body and tail, measuring 12.0 x 3.3 cm, previously 14.9 x 4.3 cm when measured similarly (series 2, image 66) and within the splenogastric ligament measuring 5.8 x 1.9 cm, previously 7.9 x 4.5 cm when measured similarly (series 2, image 46). Musculoskeletal: No acute or significant osseous findings. IMPRESSION: 1. Moderate left, small right pleural effusion with associated atelectasis or consolidation. Left pleural effusion is slightly decreased compared to prior examination. 2. Redemonstrated small volume ascites. The peritoneum generally appears slightly thickened and enhancing. 3. There are multiple redemonstrated  rim enhancing fluid collections about the abdomen. There is a new collection about the anterior left lobe of the liver and gastrohepatic ligament measuring 15.8 x 6.0 cm (series 2, image 58). There are multiple additional fluid collections which have generally decreased in size, most notably anterior to the pancreatic body and tail, measuring 12.0 x 3.3 cm, previously 14.9 x 4.3 cm when measured similarly (series 2, image 66) and within the splenogastric ligament measuring 5.8 x 1.9 cm, previously 7.9 x 4.5 cm when measured similarly (series 2, image 46). Fluctuation of fluid collections may to some extent reflect redistribution of fluid within multiple communicating collections or alternately ongoing development of inflammatory fluid collections. The presence or absence of infection is not established. 4.  Other chronic and incidental findings as detailed above. Electronically Signed   By: Eddie Candle M.D.   On: 07/10/2019 16:36   Dg Chest Port 1 View  Result Date: 07/10/2019 CLINICAL DATA:  Leukocytosis EXAM: PORTABLE CHEST 1 VIEW COMPARISON:  None. FINDINGS: Feeding tube tip is in the distal esophagus. There is a small left pleural effusion with patchy consolidation in the left base. There is atelectatic change in each lung base as well. Heart is upper normal in size with pulmonary vascularity normal. No adenopathy. No bone lesions. IMPRESSION: Feeding tube tip in distal esophagus. Persistent patchy consolidation left base with bibasilar atelectasis. Small left pleural effusion. Stable cardiac silhouette. These results will be called to the ordering clinician or representative by the Radiologist Assistant, and communication documented in the PACS or zVision Dashboard. Electronically Signed   By: Lowella Grip III M.D.   On: 07/10/2019 08:16   Dg Abd Portable 1v  Result Date: 07/11/2019 CLINICAL DATA:  Per RN-patient called that he moved to use the bathroom and he displaced the core track feeding tube  from 70 mark to 35. I advanced it back but he is c/o pain and says he feels its is displaced." EXAM: PORTABLE ABDOMEN - 1 VIEW COMPARISON:  Radiograph yesterday at 10:51 a.m. FINDINGS: Tip of the weighted enteric tube  is in the left upper quadrant below the diaphragm, in the pre pyloric stomach. Surgical clips in the right abdomen. IMPRESSION: Tip of the weighted enteric tube below the diaphragm in the pre pyloric stomach. Electronically Signed   By: Keith Rake M.D.   On: 07/11/2019 00:45   Dg Abd Portable 1v  Result Date: 07/10/2019 CLINICAL DATA:  Check feeding catheter placement EXAM: PORTABLE ABDOMEN - 1 VIEW COMPARISON:  07/09/2019 FINDINGS: Weighted feeding catheter is noted with the tip in the distal stomach. Mildly dilated small bowel is noted similar to that seen on the prior exam. IMPRESSION: Feeding catheter within the distal stomach. Electronically Signed   By: Inez Catalina M.D.   On: 07/10/2019 11:38   Dg Abd Portable 1v  Result Date: 07/09/2019 CLINICAL DATA:  Feeding tube placement EXAM: PORTABLE ABDOMEN - 1 VIEW COMPARISON:  07/05/2019 FINDINGS: Bibasilar pleural effusions and airspace disease. Esophageal tube tip overlies the proximal stomach. Dilated small bowel in the central abdomen. IMPRESSION: Esophageal tube tip overlies the proximal stomach. Electronically Signed   By: Donavan Foil M.D.   On: 07/09/2019 19:32   Medications: I have reviewed the patient's current medications.  Assessment/Plan: Idiopathic acute pancreatitis with uninfected necrosis multiple fluid collections around the pancreas on CT scan-severe protein calorie malnutrition-patient is on tube feeds for bowel rest and is requiring morphine by PCA.  Continue present care.  Agree with repeat imaging at this point to rule out infected necrosis. There are no beds available at [facility] as per Dr. Carlton Adam.  Hopefully patient will be transferred there early next week. 2) Left-sided pleural effusion. 3)  History of alcohol and tobacco abuse.   LOS: 6 days   Juanita Craver 07/11/2019, 7:52 AM

## 2019-07-11 NOTE — Progress Notes (Signed)
Patient ID: Kyle Mooney, male   DOB: 24-Jul-1971, 48 y.o.   MRN: [id number]       Subjective: Pt had issue with cortrack pulling out a bit.  C/o difficulty getting comfortable due to being sore everywhere and having 60 pound weight loss.     Objective: Vital signs in last 24 hours: Temp:  [97.9 F (36.6 C)-98.4 F (36.9 C)] 97.9 F (36.6 C) (08/22 0610) Pulse Rate:  [98-118] 101 (08/22 0610) Resp:  [17-22] 17 (08/22 0834) BP: (111-131)/(75-85) 124/75 (08/22 0610) SpO2:  [93 %-97 %] 97 % (08/22 0834) FiO2 (%):  [32 %] 32 % (08/21 1210) Last BM Date: 07/10/19  Intake/Output from previous day: 08/21 0701 - 08/22 0700 In: 1659.5 [P.O.:594; I.V.:27.7; NG/GT:662; IV Piggyback:375.9] Out: 700 [Urine:700] Intake/Output this shift: Total I/O In: 10 [I.V.:10] Out: -   PE: Gen:  A&O x 3, moderate distress Lungs:  CTAB, decreased at bases. CV: tachy, regular.   Abd: soft, mild tenderness across upper abdomen esp RUQ/epigastrium + BS Ext - no c/c/e.   Skin - looks pale.    Lab Results:  Recent Labs    07/10/19 0521 07/10/19 1254  WBC 28.0* 32.2*  HGB 10.8* 10.5*  HCT 34.6* 33.6*  PLT 676* 663*   BMET Recent Labs    07/10/19 0521 07/11/19 0443  NA 131* 130*  K 3.9 3.8  CL 93* 93*  CO2 29 30  GLUCOSE 124* 114*  BUN 10 11  CREATININE 0.49* 0.46*  CALCIUM 8.3* 7.9*   PT/INR No results for input(s): LABPROT, INR in the last 72 hours. CMP     Component Value Date/Time   NA 130 (L) 07/11/2019 0443   K 3.8 07/11/2019 0443   CL 93 (L) 07/11/2019 0443   CO2 30 07/11/2019 0443   GLUCOSE 114 (H) 07/11/2019 0443   BUN 11 07/11/2019 0443   CREATININE 0.46 (L) 07/11/2019 0443   CALCIUM 7.9 (L) 07/11/2019 0443   PROT 5.4 (L) 07/11/2019 0443   ALBUMIN 1.4 (L) 07/11/2019 0443   AST 36 07/11/2019 0443   ALT 34 07/11/2019 0443   ALKPHOS 172 (H) 07/11/2019 0443   BILITOT 0.5 07/11/2019 0443   GFRNONAA >60 07/11/2019 0443   GFRAA >60 07/11/2019 0443   Lipase       Component Value Date/Time   LIPASE 122 (H) 07/08/2019 0403       Studies/Results: Ct Chest W Contrast  Result Date: 07/10/2019 CLINICAL DATA:  Pleural effusion, abdominal pain, pancreatitis, increased WBC EXAM: CT CHEST, ABDOMEN, AND PELVIS WITH CONTRAST TECHNIQUE: Multidetector CT imaging of the chest, abdomen and pelvis was performed following the standard protocol during bolus administration of intravenous contrast. 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 Normal heart size. No pericardial effusion. Mediastinum/Nodes: No enlarged mediastinal, hilar, or axillary lymph nodes. Thyroid gland, trachea, and esophagus demonstrate no significant findings. Lungs/Pleura: Moderate left, small right pleural effusion with associated atelectasis or consolidation. Left pleural effusion is slightly decreased compared to prior examination. Musculoskeletal: No chest wall mass or suspicious bone lesions identified. CT ABDOMEN PELVIS FINDINGS Hepatobiliary: No solid liver abnormality is seen. Mildly coarse, nodular contour of the liver. Status post cholecystectomy. Pancreas: Unremarkable. No pancreatic ductal dilatation or surrounding inflammatory changes. Spleen: Normal in size without significant abnormality. Adrenals/Urinary  Tract: Adrenal glands are unremarkable. Kidneys are normal, without renal calculi, solid lesion, or hydronephrosis. There is redemonstrated soft tissue thickening in the posterior perirenal fat, likely related to inflammatory fat necrosis and unchanged from prior. Bladder is unremarkable. Stomach/Bowel: Stomach is within normal limits. Weighted enteric feeding tube is positioned with tip in the stomach. Appendix  appears normal. There are multiple loops of contrast filled although not overtly distended proximal to mid small bowel. There is gas and fluid in the colon to the rectum. Vascular/Lymphatic: Aortic atherosclerosis. Prominent mesenteric lymph nodes and/or fluid in the central small bowel mesentery unchanged from prior (series 2, image 74). Reproductive: No mass or other abnormality. Other: No abdominal wall hernia or abnormality. Redemonstrated small volume ascites. The peritoneum generally appears slightly thickened and enhancing. There are multiple redemonstrated rim enhancing fluid collections about the abdomen. There is a new collection about the anterior left lobe of the liver and gastrohepatic ligament measuring 15.8 x 6.0 cm (series 2, image 58). There are multiple additional fluid collections which have generally decreased in size, most notably anterior to the pancreatic body and tail, measuring 12.0 x 3.3 cm, previously 14.9 x 4.3 cm when measured similarly (series 2, image 66) and within the splenogastric ligament measuring 5.8 x 1.9 cm, previously 7.9 x 4.5 cm when measured similarly (series 2, image 46). Musculoskeletal: No acute or significant osseous findings. IMPRESSION: 1. Moderate left, small right pleural effusion with associated atelectasis or consolidation. Left pleural effusion is slightly decreased compared to prior examination. 2. Redemonstrated small volume ascites. The peritoneum generally appears slightly thickened and enhancing. 3. There are multiple redemonstrated rim enhancing fluid collections about the abdomen. There is a new collection about the anterior left lobe of the liver and gastrohepatic ligament measuring 15.8 x 6.0 cm (series 2, image 58). There are multiple additional fluid collections which have generally decreased in size, most notably anterior to the pancreatic body and tail, measuring 12.0 x 3.3 cm, previously 14.9 x 4.3 cm when measured similarly (series 2, image 66) and  within the splenogastric ligament measuring 5.8 x 1.9 cm, previously 7.9 x 4.5 cm when measured similarly (series 2, image 46). Fluctuation of fluid collections may to some extent reflect redistribution of fluid within multiple communicating collections or alternately ongoing development of inflammatory fluid collections. The presence or absence of infection is not established. 4.  Other chronic and incidental findings as detailed above. Electronically Signed   By: Eddie Candle M.D.   On: 07/10/2019 16:36   Ct Abdomen Pelvis W Contrast  Result Date: 07/10/2019 CLINICAL DATA:  Pleural effusion, abdominal pain, pancreatitis, increased WBC EXAM: CT CHEST, ABDOMEN, AND PELVIS WITH CONTRAST TECHNIQUE: Multidetector CT imaging of the chest, abdomen and pelvis was performed following the standard protocol during bolus administration of intravenous contrast. 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 Normal heart size. No pericardial effusion. Mediastinum/Nodes: No enlarged mediastinal, hilar, or axillary lymph nodes. Thyroid gland, trachea, and esophagus demonstrate no significant findings. Lungs/Pleura: Moderate left, small right pleural effusion with associated atelectasis or consolidation. Left pleural effusion is slightly decreased compared to prior examination. Musculoskeletal: No chest wall mass or suspicious bone lesions identified. CT ABDOMEN PELVIS FINDINGS Hepatobiliary: No solid liver abnormality is seen. Mildly coarse, nodular contour of the liver. Status post  cholecystectomy. Pancreas: Unremarkable. No pancreatic ductal dilatation or surrounding inflammatory changes. Spleen: Normal in size without significant abnormality.  Adrenals/Urinary Tract: Adrenal glands are unremarkable. Kidneys are normal, without renal calculi, solid lesion, or hydronephrosis. There is redemonstrated soft tissue thickening in the posterior perirenal fat, likely related to inflammatory fat necrosis and unchanged from prior. Bladder is unremarkable. Stomach/Bowel: Stomach is within normal limits. Weighted enteric feeding tube is positioned with tip in the stomach. Appendix appears normal. There are multiple loops of contrast filled although not overtly distended proximal to mid small bowel. There is gas and fluid in the colon to the rectum. Vascular/Lymphatic: Aortic atherosclerosis. Prominent mesenteric lymph nodes and/or fluid in the central small bowel mesentery unchanged from prior (series 2, image 74). Reproductive: No mass or other abnormality. Other: No abdominal wall hernia or abnormality. Redemonstrated small volume ascites. The peritoneum generally appears slightly thickened and enhancing. There are multiple redemonstrated rim enhancing fluid collections about the abdomen. There is a new collection about the anterior left lobe of the liver and gastrohepatic ligament measuring 15.8 x 6.0 cm (series 2, image 58). There are multiple additional fluid collections which have generally decreased in size, most notably anterior to the pancreatic body and tail, measuring 12.0 x 3.3 cm, previously 14.9 x 4.3 cm when measured similarly (series 2, image 66) and within the splenogastric ligament measuring 5.8 x 1.9 cm, previously 7.9 x 4.5 cm when measured similarly (series 2, image 46). Musculoskeletal: No acute or significant osseous findings. IMPRESSION: 1. Moderate left, small right pleural effusion with associated atelectasis or consolidation. Left pleural effusion is slightly decreased compared to prior examination. 2. Redemonstrated small volume ascites. The peritoneum generally appears slightly thickened and enhancing. 3. There are multiple redemonstrated  rim enhancing fluid collections about the abdomen. There is a new collection about the anterior left lobe of the liver and gastrohepatic ligament measuring 15.8 x 6.0 cm (series 2, image 58). There are multiple additional fluid collections which have generally decreased in size, most notably anterior to the pancreatic body and tail, measuring 12.0 x 3.3 cm, previously 14.9 x 4.3 cm when measured similarly (series 2, image 66) and within the splenogastric ligament measuring 5.8 x 1.9 cm, previously 7.9 x 4.5 cm when measured similarly (series 2, image 46). Fluctuation of fluid collections may to some extent reflect redistribution of fluid within multiple communicating collections or alternately ongoing development of inflammatory fluid collections. The presence or absence of infection is not established. 4.  Other chronic and incidental findings as detailed above. Electronically Signed   By: Eddie Candle M.D.   On: 07/10/2019 16:36   Dg Chest Port 1 View  Result Date: 07/10/2019 CLINICAL DATA:  Leukocytosis EXAM: PORTABLE CHEST 1 VIEW COMPARISON:  None. FINDINGS: Feeding tube tip is in the distal esophagus. There is a small left pleural effusion with patchy consolidation in the left base. There is atelectatic change in each lung base as well. Heart is upper normal in size with pulmonary vascularity normal. No adenopathy. No bone lesions. IMPRESSION: Feeding tube tip in distal esophagus. Persistent patchy consolidation left base with bibasilar atelectasis. Small left pleural effusion. Stable cardiac silhouette. These results will be called to the ordering clinician or representative by the Radiologist Assistant, and communication documented in the PACS or zVision Dashboard. Electronically Signed   By: Lowella Grip III M.D.   On: 07/10/2019 08:16   Dg Abd Portable 1v  Result Date: 07/11/2019 CLINICAL DATA:  Per RN-patient called that he moved to use  the bathroom and he displaced the core track feeding tube  from 70 mark to 35. I advanced it back but he is c/o pain and says he feels its is displaced." EXAM: PORTABLE ABDOMEN - 1 VIEW COMPARISON:  Radiograph yesterday at 10:51 a.m. FINDINGS: Tip of the weighted enteric tube is in the left upper quadrant below the diaphragm, in the pre pyloric stomach. Surgical clips in the right abdomen. IMPRESSION: Tip of the weighted enteric tube below the diaphragm in the pre pyloric stomach. Electronically Signed   By: Keith Rake M.D.   On: 07/11/2019 00:45   Dg Abd Portable 1v  Result Date: 07/10/2019 CLINICAL DATA:  Check feeding catheter placement EXAM: PORTABLE ABDOMEN - 1 VIEW COMPARISON:  07/09/2019 FINDINGS: Weighted feeding catheter is noted with the tip in the distal stomach. Mildly dilated small bowel is noted similar to that seen on the prior exam. IMPRESSION: Feeding catheter within the distal stomach. Electronically Signed   By: Inez Catalina M.D.   On: 07/10/2019 11:38   Dg Abd Portable 1v  Result Date: 07/09/2019 CLINICAL DATA:  Feeding tube placement EXAM: PORTABLE ABDOMEN - 1 VIEW COMPARISON:  07/05/2019 FINDINGS: Bibasilar pleural effusions and airspace disease. Esophageal tube tip overlies the proximal stomach. Dilated small bowel in the central abdomen. IMPRESSION: Esophageal tube tip overlies the proximal stomach. Electronically Signed   By: Donavan Foil M.D.   On: 07/09/2019 19:32    Anti-infectives: Anti-infectives (From admission, onward)   Start     Dose/Rate Route Frequency Ordered Stop   07/05/19 1800  ceFEPIme (MAXIPIME) 2 g in sodium chloride 0.9 % 100 mL IVPB  Status:  Discontinued     2 g 200 mL/hr over 30 Minutes Intravenous Every 8 hours 07/05/19 0852 07/05/19 1013   07/05/19 1400  meropenem (MERREM) 1 g in sodium chloride 0.9 % 100 mL IVPB     1 g 200 mL/hr over 30 Minutes Intravenous Every 8 hours 07/05/19 1013 07/15/19 1359   07/05/19 1000  metroNIDAZOLE (FLAGYL) IVPB 500 mg  Status:  Discontinued     500 mg 100 mL/hr  over 60 Minutes Intravenous Every 8 hours 07/05/19 0833 07/05/19 1013   07/05/19 0345  vancomycin (VANCOCIN) IVPB 1000 mg/200 mL premix  Status:  Discontinued     1,000 mg 200 mL/hr over 60 Minutes Intravenous  Once 07/05/19 0333 07/05/19 0336   07/05/19 0345  ceFEPIme (MAXIPIME) 2 g in sodium chloride 0.9 % 100 mL IVPB     2 g 200 mL/hr over 30 Minutes Intravenous  Once 07/05/19 0333 07/05/19 1001   07/05/19 0345  vancomycin (VANCOCIN) 1,500 mg in sodium chloride 0.9 % 500 mL IVPB     1,500 mg 250 mL/hr over 120 Minutes Intravenous  Once 07/05/19 0336 07/05/19 1001       Assessment/Plan Idiopathic pancreatitis with fluid collections/pseudocysts -Cortrak placed and on TFs.  Tube terminates in the stomach.  Would benefit from tube being post pyloric -WBC continuing to rise  Patient is still on merrem Right sided pain likely secondary to newer fluid collection FEN -IVFs,TFs via Cortrak VTE -Lovenox ID -Merrem  Recommend calling [facility] back to see when transfer.  GI has been seeing there and surgery appt was made.    If he is not able to transfer, he might benefit from perc drain from the newer superficial anterior RUQ fluid collection.     LOS: 6 days   Milus Height, MD [facility] Surgical Oncology, General Surgery, Trauma and Critical  [facility], [place] [phone] Check [email or web address], password [email or web address] for coverage night/weekend

## 2019-07-11 NOTE — Progress Notes (Signed)
MD notified RN to advance Cotrak tube 5 cm so that tube lies past the pyloric spincter. RN advanced tube to 60 cm marking, pt tolerated feeding. No questions or concerns at this time.

## 2019-07-11 NOTE — Progress Notes (Addendum)
PROGRESS NOTE                                                                                                                                                                                                             Patient Demographics:    Kyle Mooney, is a 48 y.o. male, DOB - 02/07/1971, WUJ:[id number]  Admit date - 07/05/2019   Admitting Physician A Grier Mitts., MD  Outpatient Primary MD for the patient is Patient, No Pcp Per  LOS - 6  [facility]  Chief Complaint  Patient presents with   Chest Pain   Abdominal Pain       Brief Narrative    48 year old male with recurrent pancreatitis with developing pseudocyst, IBS and GERD who is followed at [facility] for his idiopathic pancreatitis and pancreatic cyst presented with recurrent epigastric pain suspicious for acute on chronic pancreatitis.  He was last hospitalized at [facility] on 06/02/2019 with necrotizing pancreatitis.  CT angiogram of the chest was negative for PE but showed large layering left pleural effusion with near complete lower lobe collapse.  IR consulted and underwent thoracentesis of the left pleural effusion.  Surgery consulted for evaluation of pseudocyst and recommended to wait for 6 weeks for the pseudocyst to mature and either follow-up with Dr. gross or at [facility] for further evaluation. Given his persistent pain he has been requiring IV morphine PCA.   Subjective:   Complains of some pain in his epigastric area and throat (although epigastric pain appears to be much better today).  Core track again repositioned to postpyloric area.  New fluid collection noted on repeat CT abdomen, possibly infected.   Assessment  & Plan :    Principal Problem:   Idiopathic acute pancreatitis with ?uninfected necrosis On morphine PCA and empiric IV antibiotics. GI and surgery following closely.  Surgery recommends  ideally the pseudocyst will need to measure for at least 6 weeks to attempt cystoscopy gastrostomy.   Has very poor nutrition with low albumin.  Core track placed and nutrition resumed (will need to adjust the tube as it is higher up in distal esophagus and moved postpyloric).  Attempting to transfer patient to [facility] since he has been seen by GI and surgery there.  Spoke with GI Dr. Jacinto Reap on 8/21 and he  has been accepted but hopefully will not get a bed until 8/24.  (He was scheduled to see outpatient GI at [facility] on 8/24).   Active Problems: Possible new infected pseudocyst. Thoracentesis on 8/16 appears exudative.?  Parapneumonic.  Cultures negative.  Stable on 2 L via nasal cannula.  On empiric IV meropenem (day 7).  Blood culture repeated and negative. CT of the chest repeated on 8/21 showing moderate small left pleural effusion with associated atelectasis versus consolidation.  (The size of effusion is decreased compared to prior).  CT abdomen repeat shows multiple fluid collection.  There appears to be a new collection near the anterior left lobe of liver and gastrohepatic ligament measuring 5.8 x 6 cm.  Concern for infection. Given progressive leukocytosis, Surgery recommends IR consult for drainage if patient cannot be transferred to [facility].  We will consult IR since patient will not be transferred to [facility] before 8/24.  Consulted ID and spoke with Dr. Orvan Falconer who recommends the same for now.  Continue meropenem.  Hyponatremia Possibly prerenal with dehydration.  Improved with IV fluids.  Severe protein calorie malnutrition (HCC) Coretrak placed and feeding started.  Dietitian following.  Tobacco abuse Counseled on cessation  Alcohol abuse No signs of withdrawal.     Leukocytosis and thrombocytosis As outlined above.  Progressively worsened to 32K today.  Will consult ID.    Code Status : Full code  Family Communication  : None  Disposition  Plan  : Continue inpatient.  Awaiting transfer to [facility].  Barriers For Discharge : Active symptoms  Consults  : GI/surgery  Procedures  : CT abdomen pelvis, left thoracentesis  DVT Prophylaxis  :  Lovenox -   Lab Results  Component Value Date   PLT 663 (H) 07/10/2019    Antibiotics  :    Anti-infectives (From admission, onward)   Start     Dose/Rate Route Frequency Ordered Stop   07/05/19 1800  ceFEPIme (MAXIPIME) 2 g in sodium chloride 0.9 % 100 mL IVPB  Status:  Discontinued     2 g 200 mL/hr over 30 Minutes Intravenous Every 8 hours 07/05/19 0852 07/05/19 1013   07/05/19 1400  meropenem (MERREM) 1 g in sodium chloride 0.9 % 100 mL IVPB     1 g 200 mL/hr over 30 Minutes Intravenous Every 8 hours 07/05/19 1013 07/15/19 1359   07/05/19 1000  metroNIDAZOLE (FLAGYL) IVPB 500 mg  Status:  Discontinued     500 mg 100 mL/hr over 60 Minutes Intravenous Every 8 hours 07/05/19 0833 07/05/19 1013   07/05/19 0345  vancomycin (VANCOCIN) IVPB 1000 mg/200 mL premix  Status:  Discontinued     1,000 mg 200 mL/hr over 60 Minutes Intravenous  Once 07/05/19 0333 07/05/19 0336   07/05/19 0345  ceFEPIme (MAXIPIME) 2 g in sodium chloride 0.9 % 100 mL IVPB     2 g 200 mL/hr over 30 Minutes Intravenous  Once 07/05/19 0333 07/05/19 1001   07/05/19 0345  vancomycin (VANCOCIN) 1,500 mg in sodium chloride 0.9 % 500 mL IVPB     1,500 mg 250 mL/hr over 120 Minutes Intravenous  Once 07/05/19 0336 07/05/19 1001        Objective:   Vitals:   07/11/19 0038 07/11/19 0338 07/11/19 0610 07/11/19 0834  BP:   124/75   Pulse:   (!) 101   Resp: 20 20 18 17   Temp:   97.9 F (36.6 C)   TempSrc:   Oral   SpO2: 96%  95% 93% 97%  Weight:      Height:        Wt Readings from Last 3 Encounters:  07/10/19 78.9 kg  06/21/19 75 kg     Intake/Output Summary (Last 24 hours) at 07/11/2019 1102 Last data filed at 07/11/2019 1041 Gross per 24 hour  Intake 1532.53 ml  Output 900 ml  Net 632.53  ml    Physical exam Not in distress HEENT: NG +, moist mucosa, supple neck Chest: Diminished breath sounds over left lung base CVS: Normal S1-S2 GI: Soft, epigastric tenderness (improved from yesterday), bowel sounds present, nondistended Musculoskeletal: Warm, no edema       Data Review:    CBC Recent Labs  Lab 07/07/19 0221 07/08/19 0403 07/09/19 0420 07/10/19 0521 07/10/19 1254  WBC 20.0* 21.9* 20.4* 28.0* 32.2*  HGB 10.4* 10.3* 9.8* 10.8* 10.5*  HCT 34.2* 33.3* 31.6* 34.6* 33.6*  PLT 702* 662* 564* 676* 663*  MCV 87.7 86.7 87.5 86.7 87.7  MCH 26.7 26.8 27.1 27.1 27.4  MCHC 30.4 30.9 31.0 31.2 31.3  RDW 13.7 13.8 13.8 14.1 14.2  LYMPHSABS  --   --  2.1  --  2.5  MONOABS  --   --  1.5*  --  2.3*  EOSABS  --   --  0.1  --  0.1  BASOSABS  --   --  0.0  --  0.1    Chemistries  Recent Labs  Lab 07/05/19 0420 07/06/19 0212 07/07/19 0221 07/08/19 0403 07/09/19 0420 07/09/19 1706 07/10/19 0521 07/10/19 1753 07/11/19 0443  NA 131* 133* 132* 130* 128*  --  131*  --  130*  K 3.8 4.4 4.1 4.0 3.7  --  3.9  --  3.8  CL 91* 98 96* 94* 93*  --  93*  --  93*  CO2 27 24 27 27 27   --  29  --  30  GLUCOSE 165* 122* 108* 106* 94  --  124*  --  114*  BUN 9 14 12 12 10   --  10  --  11  CREATININE 0.88 0.77 0.63 0.51* 0.47*  --  0.49*  --  0.46*  CALCIUM 9.1 8.7* 8.6* 8.3* 8.0*  --  8.3*  --  7.9*  MG  --   --   --  1.8  --  2.0 2.0 2.0  --   AST 24 14* 14* 25  --   --   --   --  36  ALT 33 20 16 20   --   --   --   --  34  ALKPHOS 146* 102 107 139*  --   --   --   --  172*  BILITOT 0.4 0.5 0.7 0.8  --   --   --   --  0.5   ------------------------------------------------------------------------------------------------------------------ No results for input(s): CHOL, HDL, LDLCALC, TRIG, CHOLHDL, LDLDIRECT in the last 72 hours.  No results found for:  HGBA1C ------------------------------------------------------------------------------------------------------------------ No results for input(s): TSH, T4TOTAL, T3FREE, THYROIDAB in the last 72 hours.  Invalid input(s): FREET3 ------------------------------------------------------------------------------------------------------------------ No results for input(s): VITAMINB12, FOLATE, FERRITIN, TIBC, IRON, RETICCTPCT in the last 72 hours.  Coagulation profile Recent Labs  Lab 07/05/19 0420  INR 1.2    No results for input(s): DDIMER in the last 72 hours.  Cardiac Enzymes No results for input(s): CKMB, TROPONINI, MYOGLOBIN in the last 168 hours.  Invalid input(s): CK ------------------------------------------------------------------------------------------------------------------    Component Value Date/Time   BNP 98.7  07/05/2019 0420    Inpatient Medications  Scheduled Meds:  Chlorhexidine Gluconate Cloth  6 each Topical Q0600   enoxaparin (LOVENOX) injection  40 mg Subcutaneous Q24H   feeding supplement (PRO-STAT SUGAR FREE 64)  30 mL Per Tube BID   folic acid  1 mg Oral Daily   free water  100 mL Per Tube Q4H   guaiFENesin  600 mg Oral BID   lip balm  1 application Topical BID   metoprolol tartrate  25 mg Oral BID   morphine   Intravenous Q4H   multivitamin  15 mL Per Tube Daily   sodium chloride flush  10-40 mL Intracatheter Q12H   thiamine  100 mg Oral Daily   Or   thiamine  100 mg Intravenous Daily   Continuous Infusions:  sodium chloride 250 mL (07/07/19 2035)   famotidine (PEPCID) IV 20 mg (07/11/19 0831)   feeding supplement (OSMOLITE 1.5 CAL) 60 mL/hr at 07/11/19 1041   meropenem (MERREM) IV 200 mL/hr at 07/11/19 0600   PRN Meds:.sodium chloride, acetaminophen **OR** acetaminophen, diphenhydrAMINE **OR** diphenhydrAMINE, magic mouthwash, menthol-cetylpyridinium, naloxone **AND** sodium chloride flush, ondansetron (ZOFRAN) IV, phenol,  polyethylene glycol, sodium chloride flush  Micro Results Recent Results (from the past 240 hour(s))  Urine culture     Status: None   Collection Time: 07/05/19  2:46 AM   Specimen: In/Out Cath Urine  Result Value Ref Range Status   Specimen Description   Final    IN/OUT CATH URINE Performed at [facility], [address], [place], [place] [address]    Special Requests   Final    NONE Performed at [facility], [address] [place], [place] [address]    Culture   Final    NO GROWTH Performed at [facility], [address], [place], [place] [address]    Report Status 07/06/2019 FINAL  Final  Blood Culture (routine x 2)     Status: None   Collection Time: 07/05/19  3:33 AM   Specimen: BLOOD  Result Value Ref Range Status   Specimen Description   Final    BLOOD RIGHT ARM Performed at [facility], [address] [place], [place] [address]    Special Requests   Final    BOTTLES DRAWN AEROBIC ONLY Blood Culture results may not be optimal due to an inadequate volume of blood received in culture bottles Performed at [facility], [address], [place], [place] [address]    Culture   Final    NO GROWTH 5 DAYS Performed at [facility], [address] [place], [place] [address]    Report Status 07/10/2019 FINAL  Final  SARS Coronavirus 2 [facility] order, Performed in [facility] hospital lab) Nasopharyngeal Nasopharyngeal Swab     Status: None   Collection Time: 07/05/19  3:34 AM   Specimen: Nasopharyngeal Swab  Result Value Ref Range Status   SARS Coronavirus 2 NEGATIVE NEGATIVE Final    Comment: (NOTE) If result is NEGATIVE SARS-CoV-2 target nucleic acids are NOT DETECTED. The SARS-CoV-2 RNA is generally detectable in upper and lower  respiratory specimens during the acute phase of infection. The lowest  concentration of SARS-CoV-2 viral copies this assay can detect is 250   copies / mL. A negative result does not preclude SARS-CoV-2 infection  and should not be used as the sole basis for treatment or other  patient management decisions.  A negative result may occur  with  improper specimen collection / handling, submission of specimen other  than nasopharyngeal swab, presence of viral mutation(s) within the  areas targeted by this assay, and inadequate number of viral copies  (<250 copies / mL). A negative result must be combined with clinical  observations, patient history, and epidemiological information. If result is POSITIVE SARS-CoV-2 target nucleic acids are DETECTED. The SARS-CoV-2 RNA is generally detectable in upper and lower  respiratory specimens dur ing the acute phase of infection.  Positive  results are indicative of active infection with SARS-CoV-2.  Clinical  correlation with patient history and other diagnostic information is  necessary to determine patient infection status.  Positive results do  not rule out bacterial infection or co-infection with other viruses. If result is PRESUMPTIVE POSTIVE SARS-CoV-2 nucleic acids MAY BE PRESENT.   A presumptive positive result was obtained on the submitted specimen  and confirmed on repeat testing.  While 2019 novel coronavirus  (SARS-CoV-2) nucleic acids may be present in the submitted sample  additional confirmatory testing may be necessary for epidemiological  and / or clinical management purposes  to differentiate between  SARS-CoV-2 and other Sarbecovirus currently known to infect humans.  If clinically indicated additional testing with an alternate test  methodology [id number]) is advised. The SARS-CoV-2 RNA is generally  detectable in upper and lower respiratory sp ecimens during the acute  phase of infection. The expected result is Negative. Fact Sheet for Patients:  BoilerBrush.com.cy Fact Sheet for Healthcare  Providers: https://pope.com/ This test is not yet approved or cleared by the [place] FDA and has been authorized for detection and/or diagnosis of SARS-CoV-2 by FDA under an Emergency Use Authorization (EUA).  This EUA will remain in effect (meaning this test can be used) for the duration of the COVID-19 declaration under Section 564(b)(1) of the Act, 21 U.S.C. section 360bbb-3(b)(1), unless the authorization is terminated or revoked sooner. Performed at [facility] [address] [place], [place] [address]   Blood Culture (routine x 2)     Status: None   Collection Time: 07/05/19  7:00 AM   Specimen: BLOOD  Result Value Ref Range Status   Specimen Description   Final    BLOOD LEFT Performed at [facility], [address], [place], [place] [address]    Special Requests   Final    BOTTLES DRAWN AEROBIC AND ANAEROBIC Blood Culture adequate volume Performed at [facility], [address] [place], [place] [address]    Culture   Final    NO GROWTH 5 DAYS Performed at [facility], [address] [place], [place] [address]    Report Status 07/10/2019 FINAL  Final  Gram stain     Status: None   Collection Time: 07/05/19  4:15 PM   Specimen: Fluid  Result Value Ref Range Status   Specimen Description FLUID  Final   Special Requests NONE  Final   Gram Stain   Final    FEW WBC PRESENT, PREDOMINANTLY MONONUCLEAR NO ORGANISMS SEEN Performed at [facility], [address] [place], [place] [address]    Report Status 07/06/2019 FINAL  Final  Culture, body fluid-bottle     Status: None   Collection Time: 07/05/19  4:15 PM   Specimen: Fluid  Result Value Ref Range Status   Specimen Description FLUID  Final   Special Requests NONE  Final   Culture   Final    NO GROWTH 5 DAYS  Performed at [facility], [address] [place], [place] [address]    Report Status 07/10/2019  FINAL  Final  MRSA PCR Screening     Status: None   Collection Time: 07/06/19  3:30 AM   Specimen: Nasal Mucosa; Nasopharyngeal  Result Value Ref Range Status   MRSA by PCR NEGATIVE NEGATIVE Final    Comment:        The GeneXpert MRSA Assay (FDA approved for NASAL specimens only), is one component of a comprehensive MRSA colonization surveillance program. It is not intended to diagnose MRSA infection nor to guide or monitor treatment for MRSA infections. Performed at [facility], [address], [place], [place] [address]   Culture, blood (routine x 2)     Status: None (Preliminary result)   Collection Time: 07/10/19 10:00 AM   Specimen: BLOOD  Result Value Ref Range Status   Specimen Description   Final    BLOOD RIGHT ARM Performed at [facility], [address], [place], [place] [address]    Special Requests   Final    BOTTLES DRAWN AEROBIC AND ANAEROBIC Blood Culture adequate volume Performed at [facility], [address] [place], [place] [address]    Culture   Final    NO GROWTH < 24 HOURS Performed at [facility], [address], [place], [place] [address]    Report Status PENDING  Incomplete  Culture, blood (routine x 2)     Status: None (Preliminary result)   Collection Time: 07/10/19 10:15 AM   Specimen: BLOOD  Result Value Ref Range Status   Specimen Description   Final    BLOOD RIGHT HAND Performed at [facility], [address] [place], [place] [address]    Special Requests   Final    BOTTLES DRAWN AEROBIC AND ANAEROBIC Blood Culture adequate volume Performed at [facility], [address] [place], [place] [address]    Culture   Final    NO GROWTH < 24 HOURS Performed at [facility], [address], [place], [place] [address]    Report Status PENDING  Incomplete    Radiology Reports Dg Chest 1 View  Result Date: 06/26/2019 CLINICAL  DATA:  Status post thoracentesis. EXAM: CHEST  1 VIEW COMPARISON:  06/24/2019 FINDINGS: Interval near complete evacuation of the left pleural fluid collection. A small residual effusion remains. No evidence of a postprocedural pneumothorax. Left basilar atelectasis is noted. The right lung is clear. No right-sided effusion. IMPRESSION: Interval near complete evacuation of the left pleural fluid collection with left lower lobe atelectasis. No postprocedural pneumothorax. Electronically Signed   By: Rudie Meyer M.D.   On: 06/26/2019 09:40   Dg Abd 1 View  Result Date: 06/21/2019 CLINICAL DATA:  Pt reports extreme abdominal pain across entire abdomen. Reports hx of pancreatitis. Pain with any movements. EXAM: ABDOMEN - 1 VIEW COMPARISON:  None. FINDINGS: The bowel gas pattern is normal. Cholecystectomy clips. No radio-opaque calculi or other significant radiographic abnormality are seen. IMPRESSION: Negative. Electronically Signed   By: Corlis Leak M.D.   On: 06/21/2019 11:04   Ct Chest W Contrast  Result Date: 07/10/2019 CLINICAL DATA:  Pleural effusion, abdominal pain, pancreatitis, increased WBC EXAM: CT CHEST, ABDOMEN, AND PELVIS WITH CONTRAST TECHNIQUE: Multidetector CT imaging of the chest, abdomen and pelvis was performed following the standard protocol during bolus administration of intravenous contrast. 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 Normal heart size. No pericardial effusion. Mediastinum/Nodes: No enlarged mediastinal, hilar, or axillary lymph nodes. Thyroid gland, trachea, and esophagus demonstrate no significant findings. Lungs/Pleura: Moderate left, small right pleural effusion with associated atelectasis  or consolidation. Left pleural effusion is slightly decreased compared to prior examination. Musculoskeletal: No chest wall mass or suspicious bone lesions identified. CT ABDOMEN PELVIS FINDINGS Hepatobiliary: No solid liver abnormality is seen. Mildly coarse, nodular contour of the liver. Status post cholecystectomy. Pancreas: Unremarkable. No pancreatic ductal dilatation or surrounding inflammatory changes. Spleen: Normal in size without significant abnormality. Adrenals/Urinary Tract: Adrenal glands are unremarkable. Kidneys are normal, without renal calculi, solid lesion, or hydronephrosis. There is redemonstrated soft tissue thickening in the posterior perirenal fat, likely related to inflammatory fat necrosis and unchanged from prior. Bladder is unremarkable. Stomach/Bowel: Stomach is within normal limits. Weighted enteric feeding tube is positioned with tip in the stomach. Appendix appears normal. There are multiple loops of contrast filled although not overtly distended proximal to mid small bowel. There is gas and fluid in the colon to the rectum. Vascular/Lymphatic: Aortic atherosclerosis. Prominent mesenteric lymph nodes and/or fluid in the central small bowel mesentery unchanged from prior (series 2, image 74). Reproductive: No mass or other abnormality. Other: No abdominal wall hernia or abnormality. Redemonstrated small volume ascites. The peritoneum generally appears slightly thickened and enhancing. There are multiple redemonstrated rim enhancing fluid collections about the abdomen. There is a new collection about the anterior left lobe of the liver and gastrohepatic ligament measuring 15.8 x 6.0 cm (series 2, image 58). There are multiple additional fluid collections which have generally decreased in size, most notably anterior to the pancreatic body and tail, measuring 12.0 x 3.3 cm, previously 14.9 x 4.3 cm when measured similarly (series 2, image 66) and within the splenogastric ligament  measuring 5.8 x 1.9 cm, previously 7.9 x 4.5 cm when measured similarly (series 2, image 46). Musculoskeletal: No acute or significant osseous findings. IMPRESSION: 1. Moderate left, small right pleural effusion with associated atelectasis or consolidation. Left pleural effusion is slightly decreased compared to prior examination. 2. Redemonstrated small volume ascites. The peritoneum generally appears slightly thickened and enhancing. 3. There are multiple redemonstrated rim enhancing fluid collections about the abdomen. There is a new collection about the anterior left lobe of the liver and gastrohepatic ligament measuring 15.8 x 6.0 cm (series 2, image 58). There are multiple additional fluid collections which have generally decreased in size, most notably anterior to the pancreatic body and tail, measuring 12.0 x 3.3 cm, previously 14.9 x 4.3 cm when measured similarly (series 2, image 66) and within the splenogastric ligament measuring 5.8 x 1.9 cm, previously 7.9 x 4.5 cm when measured similarly (series 2, image 46). Fluctuation of fluid collections may to some extent reflect redistribution of fluid within multiple communicating collections or alternately ongoing development of inflammatory fluid collections. The presence or absence of infection is not established. 4.  Other chronic and incidental findings as detailed above. Electronically Signed   By: Lauralyn Primes M.D.   On: 07/10/2019 16:36   Ct Chest W Contrast  Result Date: 06/25/2019 CLINICAL DATA:  Shortness of breath, acute on chronic pancreatitis with multiple peripancreatic fluid collections, recurrent epigastric pain EXAM: CT CHEST, ABDOMEN, AND PELVIS WITH CONTRAST TECHNIQUE: Multidetector CT  imaging of the chest, abdomen and pelvis was performed following the standard protocol during bolus administration of intravenous contrast. 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 Normal heart size. No pericardial effusion. Mediastinum/Nodes: No enlarged mediastinal, hilar, or axillary lymph nodes. Thyroid gland, trachea, and esophagus demonstrate no significant findings. Lungs/Pleura: There is a large left pleural effusion with associated atelectasis or consolidation, enlarged compared to prior examination. There is a new, trace right pleural effusion. There are scattered subpleural ground-glass pulmonary opacities, most conspicuous in the right upper lobe. Musculoskeletal: No chest wall mass or suspicious bone lesions identified. CT ABDOMEN PELVIS FINDINGS Hepatobiliary: No focal liver abnormality is seen. Status post cholecystectomy. No biliary dilatation. Pancreas: Redemonstrated extensive retroperitoneal inflammation and multiple peripancreatic fluid collections. There is a new discrete fluid collection or component adjacent to the greater curvature of the stomach and spleen measuring approximately 6.2 x 2.7 cm (series 3, image 52). There is an additional new fluid collection within or adjacent to the omentum measuring 4.7 x 2.5 cm (series 3, image 75). There is an additional new fluid collection within the small bowel mesentery measuring 5.0 x 3.2 cm (series 3, image 86). Other fluid collections are not significantly changed, for example anterior to the pancreatic neck (series 3, image 67), adjacent to the porta hepatis (series 3, image 67), adjacent to the left kidney and splenic flexure (series 3, image 73), and posterior to the left kidney (series 3, image 65). Spleen: Splenomegaly, maximum span 14.4 cm. Adrenals/Urinary Tract: Adrenal glands are unremarkable. Kidneys are normal, without renal calculi, solid lesion, or hydronephrosis. Bladder is unremarkable. Stomach/Bowel: Stomach is within normal limits. Appendix appears normal. Inflammatory thickening of the  transverse colon (series 3, image 81), similar to prior examination, and of the mid small bowel, particularly a segment in the anterior abdomen (series 3, image 95). Vascular/Lymphatic: Aortic atherosclerosis. No enlarged abdominal or pelvic lymph nodes. Reproductive: No mass or other abnormality. Other: Extensive anasarca. Small volume ascites, similar to prior examination. Musculoskeletal: No acute or significant osseous findings. IMPRESSION: 1. There is a large left pleural effusion with associated atelectasis or consolidation, enlarged compared to prior examination. There is a new, trace right pleural effusion. 2. There are scattered subpleural ground-glass pulmonary opacities, most conspicuous in the right upper lobe. These are nonspecific and infectious or inflammatory. 3. Redemonstrated extensive retroperitoneal inflammation and multiple peripancreatic fluid collections. There is a new discrete fluid collection or component adjacent to the greater curvature of the stomach and spleen measuring approximately 6.2 x 2.7 cm (series 3, image 52). There is an additional new fluid collection within or adjacent to the omentum measuring 4.7 x 2.5 cm (series 3, image 75). There is an additional new fluid collection within the small bowel mesentery measuring 5.0 x 3.2 cm (series 3, image 86). 4. Other fluid collections are not significantly changed, for example anterior to the pancreatic neck (series 3, image 67), adjacent to the porta hepatis (series 3, image 67), adjacent to the left kidney and splenic flexure (series 3, image 73), and posterior to the left kidney (series 3, image 65). 5. Inflammatory thickening of the transverse colon (series 3, image 81), similar to prior examination, and of the mid small bowel, particularly a segment in the anterior abdomen (series 3, image 95). 6. Extensive anasarca. Small volume ascites, similar to prior examination. Electronically Signed   By:  Lauralyn Primes M.D.   On: 06/25/2019  14:18   Ct Angio Chest Pe W/cm &/or Wo Cm  Result Date: 07/05/2019 CLINICAL DATA:  Chest pain.  Complicated pancreatitis. EXAM: CT ANGIOGRAPHY CHEST WITH CONTRAST TECHNIQUE: Multidetector CT imaging of the chest was performed using the standard protocol during bolus administration of intravenous contrast. Multiplanar CT image reconstructions and MIPs were obtained to evaluate the vascular anatomy. CONTRAST:  OMNIPAQUE IOHEXOL 350 MG/ML SOLN COMPARISON:  Chest CT from 10 days ago FINDINGS: Cardiovascular: Satisfactory opacification of the pulmonary arteries to the segmental level. No evidence of pulmonary embolism when accounting for intermittent motion artifact. Normal heart size. No pericardial effusion. Aberrant right subclavian artery Mediastinum/Nodes: Negative for adenopathy or mass. Lungs/Pleura: Large left pleural effusion that is layering. Trace right pleural fluid. Near complete collapse of the left lower lobe. No consolidation or edema. Upper Abdomen: As reported separately. Musculoskeletal: No acute or aggressive finding Review of the MIP images confirms the above findings. IMPRESSION: 1. Negative for pulmonary embolism. 2. Large layering left pleural effusion with near complete lower lobe collapse. Electronically Signed   By: Marnee Spring M.D.   On: 07/05/2019 07:16   Ct Abdomen Pelvis W Contrast  Result Date: 07/10/2019 CLINICAL DATA:  Pleural effusion, abdominal pain, pancreatitis, increased WBC EXAM: CT CHEST, ABDOMEN, AND PELVIS WITH CONTRAST TECHNIQUE: Multidetector CT imaging of the chest, abdomen and pelvis was performed following the standard protocol during bolus administration of intravenous contrast. 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 Normal heart size. No pericardial effusion. Mediastinum/Nodes: No enlarged mediastinal, hilar, or axillary lymph nodes. Thyroid gland, trachea, and esophagus demonstrate no significant findings. Lungs/Pleura: Moderate left, small right pleural effusion with associated atelectasis or consolidation. Left pleural effusion is slightly decreased compared to prior examination. Musculoskeletal: No chest wall mass or suspicious bone lesions identified. CT ABDOMEN PELVIS FINDINGS Hepatobiliary: No solid liver abnormality is seen. Mildly coarse, nodular contour of the liver. Status post cholecystectomy. Pancreas: Unremarkable. No pancreatic ductal dilatation or surrounding inflammatory changes. Spleen: Normal in size without significant abnormality. Adrenals/Urinary Tract: Adrenal glands are unremarkable. Kidneys are normal, without renal calculi, solid lesion, or hydronephrosis. There is redemonstrated soft tissue thickening in the posterior perirenal fat, likely related to inflammatory fat necrosis and unchanged from prior. Bladder is unremarkable. Stomach/Bowel: Stomach is within normal limits. Weighted enteric feeding tube is positioned with tip in the stomach. Appendix appears normal. There are multiple loops of contrast filled although not overtly distended proximal to mid small bowel. There is gas and fluid in the colon to the rectum. Vascular/Lymphatic: Aortic atherosclerosis. Prominent mesenteric lymph nodes and/or fluid in the central small bowel mesentery unchanged from prior (series 2, image 74). Reproductive: No mass or other abnormality. Other: No abdominal wall hernia or abnormality. Redemonstrated small volume ascites. The peritoneum generally appears slightly thickened and enhancing. There are multiple redemonstrated rim enhancing fluid collections about the abdomen. There is a new collection about the anterior left lobe of the liver and gastrohepatic  ligament measuring 15.8 x 6.0 cm (series 2, image 58). There are multiple additional fluid collections which have generally decreased in size, most notably anterior to the pancreatic body and tail, measuring 12.0 x 3.3 cm, previously 14.9 x 4.3 cm when measured similarly (series 2, image  66) and within the splenogastric ligament measuring 5.8 x 1.9 cm, previously 7.9 x 4.5 cm when measured similarly (series 2, image 46). Musculoskeletal: No acute or significant osseous findings. IMPRESSION: 1. Moderate left, small right pleural effusion with associated atelectasis or consolidation. Left pleural effusion is slightly decreased compared to prior examination. 2. Redemonstrated small volume ascites. The peritoneum generally appears slightly thickened and enhancing. 3. There are multiple redemonstrated rim enhancing fluid collections about the abdomen. There is a new collection about the anterior left lobe of the liver and gastrohepatic ligament measuring 15.8 x 6.0 cm (series 2, image 58). There are multiple additional fluid collections which have generally decreased in size, most notably anterior to the pancreatic body and tail, measuring 12.0 x 3.3 cm, previously 14.9 x 4.3 cm when measured similarly (series 2, image 66) and within the splenogastric ligament measuring 5.8 x 1.9 cm, previously 7.9 x 4.5 cm when measured similarly (series 2, image 46). Fluctuation of fluid collections may to some extent reflect redistribution of fluid within multiple communicating collections or alternately ongoing development of inflammatory fluid collections. The presence or absence of infection is not established. 4.  Other chronic and incidental findings as detailed above. Electronically Signed   By: Lauralyn Primes M.D.   On: 07/10/2019 16:36   Ct Abdomen Pelvis W Contrast  Result Date: 06/25/2019 CLINICAL DATA:  Shortness of breath, acute on chronic pancreatitis with multiple peripancreatic fluid collections, recurrent epigastric  pain EXAM: CT CHEST, ABDOMEN, AND PELVIS WITH CONTRAST TECHNIQUE: Multidetector CT imaging of the chest, abdomen and pelvis was performed following the standard protocol during bolus administration of intravenous contrast. 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 Normal heart size. No pericardial effusion. Mediastinum/Nodes: No enlarged mediastinal, hilar, or axillary lymph nodes. Thyroid gland, trachea, and esophagus demonstrate no significant findings. Lungs/Pleura: There is a large left pleural effusion with associated atelectasis or consolidation, enlarged compared to prior examination. There is a new, trace right pleural effusion. There are scattered subpleural ground-glass pulmonary opacities, most conspicuous in the right upper lobe. Musculoskeletal: No chest wall mass or suspicious bone lesions identified. CT ABDOMEN PELVIS FINDINGS Hepatobiliary: No focal liver abnormality is seen. Status post cholecystectomy. No biliary dilatation. Pancreas: Redemonstrated extensive retroperitoneal inflammation and multiple peripancreatic fluid collections. There is a new discrete fluid collection or component adjacent to the greater curvature of the stomach and spleen measuring approximately 6.2 x 2.7 cm (series 3, image 52). There is an additional new fluid collection within or adjacent to the omentum measuring 4.7 x 2.5 cm (series 3, image 75). There is an additional new fluid collection within the small bowel mesentery measuring 5.0 x 3.2 cm (series 3, image 86). Other fluid collections are not significantly changed, for example anterior to the pancreatic neck (series 3, image 67), adjacent to the porta hepatis (series 3, image 67), adjacent to the left kidney and splenic flexure (series 3, image 73), and posterior to the left  kidney (series 3, image 65). Spleen: Splenomegaly, maximum span 14.4 cm. Adrenals/Urinary Tract: Adrenal glands are unremarkable. Kidneys are normal, without renal calculi, solid lesion, or hydronephrosis. Bladder is unremarkable. Stomach/Bowel: Stomach is within normal limits. Appendix appears normal. Inflammatory thickening of the transverse colon (series 3, image 81), similar to prior examination, and of the mid small bowel, particularly a segment in the anterior abdomen (series 3, image 95). Vascular/Lymphatic:  Aortic atherosclerosis. No enlarged abdominal or pelvic lymph nodes. Reproductive: No mass or other abnormality. Other: Extensive anasarca. Small volume ascites, similar to prior examination. Musculoskeletal: No acute or significant osseous findings. IMPRESSION: 1. There is a large left pleural effusion with associated atelectasis or consolidation, enlarged compared to prior examination. There is a new, trace right pleural effusion. 2. There are scattered subpleural ground-glass pulmonary opacities, most conspicuous in the right upper lobe. These are nonspecific and infectious or inflammatory. 3. Redemonstrated extensive retroperitoneal inflammation and multiple peripancreatic fluid collections. There is a new discrete fluid collection or component adjacent to the greater curvature of the stomach and spleen measuring approximately 6.2 x 2.7 cm (series 3, image 52). There is an additional new fluid collection within or adjacent to the omentum measuring 4.7 x 2.5 cm (series 3, image 75). There is an additional new fluid collection within the small bowel mesentery measuring 5.0 x 3.2 cm (series 3, image 86). 4. Other fluid collections are not significantly changed, for example anterior to the pancreatic neck (series 3, image 67), adjacent to the porta hepatis (series 3, image 67), adjacent to the left kidney and splenic flexure (series 3, image 73), and posterior to the left kidney (series 3, image 65). 5.  Inflammatory thickening of the transverse colon (series 3, image 81), similar to prior examination, and of the mid small bowel, particularly a segment in the anterior abdomen (series 3, image 95). 6. Extensive anasarca. Small volume ascites, similar to prior examination. Electronically Signed   By: Lauralyn Primes M.D.   On: 06/25/2019 14:18   Ct Abdomen Pelvis W Contrast  Result Date: 06/20/2019 CLINICAL DATA:  Abdominal pain.  Neutropenia. EXAM: CT ABDOMEN AND PELVIS WITH CONTRAST TECHNIQUE: Multidetector CT imaging of the abdomen and pelvis was performed using the standard protocol following bolus administration of intravenous contrast. CONTRAST:  OMNIPAQUE IOHEXOL 300 MG/ML  SOLN COMPARISON:  None. FINDINGS: Lower chest: There is a moderate-sized left-sided pleural effusion with near complete collapse of the left lower lobe.The heart size is normal. Hepatobiliary: The liver is normal. Status post cholecystectomy.There is no biliary ductal dilation. Pancreas: There are multiple peripancreatic fluid collections the largest of which measures approximately 3.7 by 1.8 cm. The pancreas appears to enhance symmetrically. Spleen: The spleen is enlarged measuring approximately 14 cm craniocaudad. Adrenals/Urinary Tract: --Adrenal glands: No adrenal hemorrhage. --Right kidney/ureter: No hydronephrosis or perinephric hematoma. --Left kidney/ureter: There is no left-sided hydronephrosis. There is a complex collection in the left posterior pararenal space measuring approximately 8.5 x 2.5 cm. --Urinary bladder: Unremarkable. Stomach/Bowel: --Stomach/Duodenum: There is some wall thickening of the stomach. --Small bowel: No dilatation or inflammation. --Colon: There are soft tissue densities along the descending colon measuring approximately 2.8 x 2.4 cm. There is a collection of the splenic flexure measuring 4.7 x 2.7 cm causing mass effect on the nearby:Marland Kitchen There is wall thickening of the transverse colon without evidence  of an obstruction. --Appendix: Not visualized. No right lower quadrant inflammation or free fluid. Vascular/Lymphatic: Atherosclerotic changes are noted of the abdominal aorta without evidence of an abdominal aortic aneurysm. The portal vein and splenic vein remain patent. The splenic artery remains patent. --No retroperitoneal lymphadenopathy. --No mesenteric lymphadenopathy. --No pelvic or inguinal lymphadenopathy. Reproductive: Unremarkable Other: There is a small volume of free fluid in the pelvis. There are multiple scattered collections in the retroperitoneum and peritoneal cavity. A few these collections demonstrate mild rim enhancement. For example in the left upper quadrant there is a 3.9 x 1.9  cm collection that demonstrates mild peripheral rim enhancement. There is a 3.8 by 4.4 cm collection in the region of the gallbladder fossa. Given the surgical clips in the gallbladder fossa this is favored to represent a loculated fluid collection as opposed to a remnant gallbladder. Musculoskeletal. No acute displaced fractures. IMPRESSION: 1. Overall findings concerning for pancreatitis with multiple loculated fluid collections as detailed above. Some of the smaller collections, for example in the left upper quadrant, demonstrate rim enhancement. An abscess is not excluded. There is no CT evidence for pancreatic necrosis. 2. Moderate-sized left-sided pleural effusion with at least partial collapse of the left lower lobe. 3. Diffuse wall thickening of the transverse colon and splenic flexure favored to be reactive. Other considerations include infectious or inflammatory colitis. 4. Splenomegaly.  The splenic vein remains patent. Electronically Signed   By: Katherine Mantle M.D.   On: 06/20/2019 21:07   Dg Chest Port 1 View  Result Date: 07/10/2019 CLINICAL DATA:  Leukocytosis EXAM: PORTABLE CHEST 1 VIEW COMPARISON:  None. FINDINGS: Feeding tube tip is in the distal esophagus. There is a small left pleural  effusion with patchy consolidation in the left base. There is atelectatic change in each lung base as well. Heart is upper normal in size with pulmonary vascularity normal. No adenopathy. No bone lesions. IMPRESSION: Feeding tube tip in distal esophagus. Persistent patchy consolidation left base with bibasilar atelectasis. Small left pleural effusion. Stable cardiac silhouette. These results will be called to the ordering clinician or representative by the Radiologist Assistant, and communication documented in the PACS or zVision Dashboard. Electronically Signed   By: Bretta Bang III M.D.   On: 07/10/2019 08:16   Dg Chest Port 1 View  Result Date: 07/05/2019 CLINICAL DATA:  Status post left-sided thoracentesis EXAM: PORTABLE CHEST 1 VIEW COMPARISON:  July 05, 2019 FINDINGS: The left-sided pleural effusion has significantly decreased in size from the prior study. There is no evidence for left-sided pneumothorax. Bibasilar airspace opacities are noted, left worse than right. The lung volumes are low. The heart size is stable from prior study. IMPRESSION: 1. No pneumothorax status post left-sided thoracentesis. 2. Significant interval decrease in size of the left-sided pleural effusion. 3. Bibasilar airspace opacities, left greater than right, favored to represent atelectasis. 4. Low lung volumes. Electronically Signed   By: Katherine Mantle M.D.   On: 07/05/2019 16:31   Dg Chest Portable 1 View  Result Date: 07/05/2019 CLINICAL DATA:  Chest pain. EXAM: PORTABLE CHEST 1 VIEW COMPARISON:  Radiograph 06/26/2019, CT 06/25/2019 FINDINGS: Re-accumulation of left pleural effusion after thoracentesis. Pleural effusion is moderate in size with associated underlying left lung base atelectasis/consolidation. Possible small right pleural effusion. Unchanged heart size and mediastinal contours. Increasing patchy right lung base opacity. No pneumothorax. IMPRESSION: 1. Re-accumulation of left pleural effusion  after thoracentesis, now moderate in size. Associated left lung base atelectasis/consolidation. 2. Possible small right pleural effusion. Increasing right lung base opacity may be atelectasis or pneumonia. Electronically Signed   By: Narda Rutherford M.D.   On: 07/05/2019 03:22   Dg Chest Port 1 View  Result Date: 06/24/2019 CLINICAL DATA:  Chest pain for 1 hour EXAM: PORTABLE CHEST 1 VIEW COMPARISON:  06/20/2019 FINDINGS: Left-sided pleural effusion is again seen and slightly enlarged when compare with the prior study. Likely underlying atelectasis/infiltrate is present in the left base. The right lung is clear. No bony abnormality is noted. IMPRESSION: Increasing left-sided pleural effusion. Electronically Signed   By: Eulah Pont.D.  On: 06/24/2019 19:33   Dg Chest Portable 1 View  Result Date: 06/20/2019 CLINICAL DATA:  Chest pain EXAM: PORTABLE CHEST 1 VIEW COMPARISON:  None. FINDINGS: There is airspace consolidation in the left base with left pleural effusion. Lungs elsewhere are clear. Heart size and pulmonary vascularity are normal. No adenopathy. No bone lesions. IMPRESSION: Left lower lobe airspace consolidation consistent with pneumonia. Small left pleural effusion. Lungs elsewhere clear. No adenopathy. Heart size normal. Followup PA and lateral chest radiographs recommended in 3-4 weeks following trial of antibiotic therapy to ensure resolution and exclude underlying malignancy. Electronically Signed   By: Bretta Bang III M.D.   On: 06/20/2019 19:32   Dg Abd Portable 1v  Result Date: 07/11/2019 CLINICAL DATA:  Per RN-patient called that he moved to use the bathroom and he displaced the core track feeding tube from 70 mark to 35. I advanced it back but he is c/o pain and says he feels its is displaced." EXAM: PORTABLE ABDOMEN - 1 VIEW COMPARISON:  Radiograph yesterday at 10:51 a.m. FINDINGS: Tip of the weighted enteric tube is in the left upper quadrant below the diaphragm, in the pre  pyloric stomach. Surgical clips in the right abdomen. IMPRESSION: Tip of the weighted enteric tube below the diaphragm in the pre pyloric stomach. Electronically Signed   By: Narda Rutherford M.D.   On: 07/11/2019 00:45   Dg Abd Portable 1v  Result Date: 07/10/2019 CLINICAL DATA:  Check feeding catheter placement EXAM: PORTABLE ABDOMEN - 1 VIEW COMPARISON:  07/09/2019 FINDINGS: Weighted feeding catheter is noted with the tip in the distal stomach. Mildly dilated small bowel is noted similar to that seen on the prior exam. IMPRESSION: Feeding catheter within the distal stomach. Electronically Signed   By: Alcide Clever M.D.   On: 07/10/2019 11:38   Dg Abd Portable 1v  Result Date: 07/09/2019 CLINICAL DATA:  Feeding tube placement EXAM: PORTABLE ABDOMEN - 1 VIEW COMPARISON:  07/05/2019 FINDINGS: Bibasilar pleural effusions and airspace disease. Esophageal tube tip overlies the proximal stomach. Dilated small bowel in the central abdomen. IMPRESSION: Esophageal tube tip overlies the proximal stomach. Electronically Signed   By: Jasmine Pang M.D.   On: 07/09/2019 19:32   Ct Renal Stone Study  Result Date: 07/05/2019 CLINICAL DATA:  Chest pain. EXAM: CT ABDOMEN AND PELVIS WITHOUT CONTRAST TECHNIQUE: Multidetector CT imaging of the abdomen and pelvis was performed following the standard protocol without IV contrast. COMPARISON:  Fifteen days ago FINDINGS: Lower chest: At least moderate left pleural effusion with multi segment atelectasis. Pending chest CT. Hepatobiliary: No focal liver abnormality.Cholecystectomy. Pancreas: History of recurrent pancreatitis. Progressive organized collections: 1. 8 x 4 cm along the upper greater curvature of the stomach 2. 6 cm along the mid greater curvature of the stomach-which may be connected to #1. 3. Peripancreatic to splenic tail, 15 x 4 by up to 6 cm-with further extent tracking inferiorly along the descending colon. 4. Ligamentum venosum of the liver measuring 5 x 3 cm  5. Ventral midline abdomen at 5 cm 6. Lower ventral midline abdomen with interloop distortion, 9 cm. These collections exert mass effect on the stomach, transverse colon, and descending colon. Fat density with rim of stranding posterior to the left kidney in the posterior pararenal space, stable in compatible with fat necrosis. There is generalized retroperitoneal edema about the collections and pancreas. No gas within any of the collections to suggest fistula or definite superinfection. Spleen: Stable size and density. Adrenals/Urinary Tract: Negative adrenals. No hydronephrosis  or stone. Unremarkable bladder. Stomach/Bowel: No obstruction. Low-density about the hepatic flexure is likely a combination of reactive wall thickening and pericolonic fluid. Vascular/Lymphatic: No visible acute vascular abnormality. No noted adenopathy. Reproductive:Negative Other: Right upper quadrant ascites which may be loculated. Musculoskeletal: No acute abnormalities. IMPRESSION: 1. Recent pancreatitis with multiple organized fluid collections in the abdomen as listed and measured above. The largest measures 15 x 4 x 6 cm. Collections exert mass effect on the stomach, transverse colon, descending colon without obstruction. 2. Stable left posterior pararenal fat necrosis. 3. At least moderate left pleural effusion with multi segment atelectasis. Electronically Signed   By: Marnee Spring M.D.   On: 07/05/2019 07:12   Ir Thoracentesis Asp Pleural Space W/img Guide  Result Date: 06/26/2019 INDICATION: Shortness of breath. Left-sided pleural effusion. Request for diagnostic and therapeutic thoracentesis. EXAM: ULTRASOUND GUIDED LEFT THORACENTESIS MEDICATIONS: None. COMPLICATIONS: None immediate. PROCEDURE: An ultrasound guided thoracentesis was thoroughly discussed with the patient and questions answered. The benefits, risks, alternatives and complications were also discussed. The patient understands and wishes to proceed with the  procedure. Written consent was obtained. Ultrasound was performed to localize and mark an adequate pocket of fluid in the left chest. The area was then prepped and draped in the normal sterile fashion. 1% Lidocaine was used for local anesthesia. Under ultrasound guidance a 6 Fr Safe-T-Centesis catheter was introduced. Thoracentesis was performed. The catheter was removed and a dressing applied. FINDINGS: A total of approximately 1.3 L of hazy yellow fluid was removed. Samples were sent to the laboratory as requested by the clinical team. IMPRESSION: Successful ultrasound guided left thoracentesis yielding 1.3 L of pleural fluid. Read by: Brayton El PA-C Electronically Signed   By: Richarda Overlie M.D.   On: 06/26/2019 09:19   US Thoracentesis Asp Pleural Space W/img Guide  Result Date: 07/05/2019 INDICATION: Patient with history of recurrent pancreatitis, GERD, dyspnea, recurrent left pleural effusion. Request made for diagnostic and therapeutic left thoracentesis. EXAM: ULTRASOUND GUIDED DIAGNOSTIC AND THERAPEUTIC LEFT THORACENTESIS MEDICATIONS: None COMPLICATIONS: None immediate. PROCEDURE: An ultrasound guided thoracentesis was thoroughly discussed with the patient and questions answered. The benefits, risks, alternatives and complications were also discussed. The patient understands and wishes to proceed with the procedure. Written consent was obtained. Ultrasound was performed to localize and mark an adequate pocket of fluid in the left chest. The area was then prepped and draped in the normal sterile fashion. 1% Lidocaine was used for local anesthesia. Under ultrasound guidance a 6 Fr Safe-T-Centesis catheter was introduced. Thoracentesis was performed. The catheter was removed and a dressing applied. FINDINGS: A total of approximately 770 cc of yellow fluid was removed. Samples were sent to the laboratory as requested by the clinical team. IMPRESSION: Successful ultrasound guided diagnostic and therapeutic  left thoracentesis yielding 770 cc of pleural fluid. Read by: Jeananne Rama, PA-C Electronically Signed   By: Richarda Overlie M.D.   On: 07/05/2019 16:12    Time Spent in minutes  35   Demarius Archila M.D on 07/11/2019 at 11:02 AM  Between 7am to 7pm - Pager - [phone]  After 7pm go to [email or web address] - password [facility]  [facility] -  Office  [phone]

## 2019-07-12 ENCOUNTER — Inpatient Hospital Stay (HOSPITAL_COMMUNITY): Payer: Self-pay

## 2019-07-12 DIAGNOSIS — K863 Pseudocyst of pancreas: Secondary | ICD-10-CM | POA: Diagnosis not present

## 2019-07-12 LAB — COMPREHENSIVE METABOLIC PANEL
ALT: 41 U/L (ref 0–44)
AST: 44 U/L — ABNORMAL HIGH (ref 15–41)
Albumin: 1.6 g/dL — ABNORMAL LOW (ref 3.5–5.0)
Alkaline Phosphatase: 220 U/L — ABNORMAL HIGH (ref 38–126)
Anion gap: 11 (ref 5–15)
BUN: 11 mg/dL (ref 6–20)
CO2: 28 mmol/L (ref 22–32)
Calcium: 8.1 mg/dL — ABNORMAL LOW (ref 8.9–10.3)
Chloride: 91 mmol/L — ABNORMAL LOW (ref 98–111)
Creatinine, Ser: 0.46 mg/dL — ABNORMAL LOW (ref 0.61–1.24)
GFR calc Af Amer: >60 mL/min (ref >60)
GFR calc non Af Amer: >60 mL/min (ref >60)
Glucose, Bld: 110 mg/dL — ABNORMAL HIGH (ref 70–99)
Potassium: 4.3 mmol/L (ref 3.5–5.1)
Sodium: 130 mmol/L — ABNORMAL LOW (ref 135–145)
Total Bilirubin: 0.4 mg/dL (ref 0.3–1.2)
Total Protein: 5.9 g/dL — ABNORMAL LOW (ref 6.5–8.1)

## 2019-07-12 LAB — CBC
HCT: 32 % — ABNORMAL LOW (ref 39.0–52.0)
Hemoglobin: 9.8 g/dL — ABNORMAL LOW (ref 13.0–17.0)
MCH: 27.1 pg (ref 26.0–34.0)
MCHC: 30.6 g/dL (ref 30.0–36.0)
MCV: 88.6 fL (ref 80.0–100.0)
Platelets: 528 10*3/uL — ABNORMAL HIGH (ref 150–400)
RBC: 3.61 MIL/uL — ABNORMAL LOW (ref 4.22–5.81)
RDW: 14.1 % (ref 11.5–15.5)
WBC: 32.8 10*3/uL — ABNORMAL HIGH (ref 4.0–10.5)
nRBC: 0 % (ref 0.0–0.2)

## 2019-07-12 LAB — CREATININE, SERUM
Creatinine, Ser: 0.43 mg/dL — ABNORMAL LOW (ref 0.61–1.24)
GFR calc Af Amer: >60 mL/min (ref >60)
GFR calc non Af Amer: >60 mL/min (ref >60)

## 2019-07-12 LAB — GLUCOSE, CAPILLARY
Glucose-Capillary: 114 mg/dL — ABNORMAL HIGH (ref 70–99)
Glucose-Capillary: 119 mg/dL — ABNORMAL HIGH (ref 70–99)
Glucose-Capillary: 112 mg/dL — ABNORMAL HIGH (ref 70–99)
Glucose-Capillary: 87 mg/dL (ref 70–99)
Glucose-Capillary: 99 mg/dL (ref 70–99)

## 2019-07-12 MED ORDER — LIDOCAINE 5 % EX PTCH
1.0000 | MEDICATED_PATCH | CUTANEOUS | Status: DC
  Administered 2019-07-12 – 2019-07-14 (×3): 1 via TRANSDERMAL
  Filled 2019-07-12 (×4): qty 1

## 2019-07-12 MED ORDER — MORPHINE SULFATE 2 MG/ML IV SOLN: 1.0000 mg | INTRAVENOUS | 0 refills | Status: AC

## 2019-07-12 MED ORDER — OSMOLITE 1.5 CAL PO LIQD: 1000.0000 mL | ORAL | 0 refills | Status: AC

## 2019-07-12 MED ORDER — PRO-STAT SUGAR FREE PO LIQD: 30.0000 mL | Freq: Two times a day (BID) | ORAL | 0 refills | Status: AC

## 2019-07-12 MED ORDER — KETOROLAC TROMETHAMINE 15 MG/ML IJ SOLN
15.0000 mg | Freq: Once | INTRAMUSCULAR | Status: AC
  Administered 2019-07-12: 15 mg via INTRAVENOUS
  Filled 2019-07-12: qty 1

## 2019-07-12 MED ORDER — ADULT MULTIVITAMIN LIQUID CH: 15.0000 mL | Freq: Every day | ORAL | 0 refills | Status: AC

## 2019-07-12 MED ORDER — SODIUM CHLORIDE 0.9 % IV SOLN: 1.0000 g | Freq: Three times a day (TID) | INTRAVENOUS | 0 refills | Status: AC

## 2019-07-12 NOTE — Progress Notes (Signed)
   07/12/19 0824  Vitals  Temp 98.2 F (36.8 C)  Temp Source Oral  BP 140/90  MAP (mmHg) 104  BP Location Right Arm  BP Method Automatic  Patient Position (if appropriate) Lying  Pulse Rate (!) 111  Pulse Rate Source Monitor  Resp 20  Oxygen Therapy  SpO2 97 %  O2 Device Nasal Cannula  O2 Flow Rate (L/min) 1 L/min  MEWS Score  MEWS RR 0  MEWS Pulse 2  MEWS Systolic 0  MEWS LOC 0  MEWS Temp 0  MEWS Score 2  MEWS Score Color Yellow    Pt was initiated on yellow MEWS on 8/22 afternoon. MD aware of elevated pulse rate. Will continue to monitor pt and continue with vitals Q4H.

## 2019-07-12 NOTE — Progress Notes (Signed)
RN noticed that cortrack tube was at the 25 cm marking, previously was at 60 cm marking. MD was notified and informed RN to advance back to 60 cm marking. RN marked tube and secured tube so that it could not be moved. Pt tolerated. Will continue to monitor pt.

## 2019-07-12 NOTE — Progress Notes (Signed)
Patient ID: Kyle Mooney, male   DOB: 05/01/1971, 48 y.o.   MRN: [id number]       Subjective: Pt feels a little better today.  IR saw yesterday and discussed potential for drain placement of new  RUQ perihepatic collection.  Pt declined and wanted to wait for transfer.  Pt denies nausea.  Had some bloating last night but this is better.    Objective: Vital signs in last 24 hours: Temp:  [98.2 F (36.8 C)-99 F (37.2 C)] 98.3 F (36.8 C) (08/23 0555) Pulse Rate:  [103-115] 105 (08/23 0555) Resp:  [14-22] 20 (08/23 0555) BP: (120-136)/(76-88) 124/77 (08/23 0555) SpO2:  [92 %-98 %] 97 % (08/23 0555) Last BM Date: 07/10/19  Intake/Output from previous day: 08/22 0701 - 08/23 0700 In: 1841.1 [P.O.:70; I.V.:10; NG/GT:1403.2; IV Piggyback:357.9] Out: 725 [Urine:725] Intake/Output this shift: No intake/output data recorded.  PE: Gen:  A&O x 3, moderate distress Lungs:  CTAB, decreased at bases. CV: tachy, regular.   Abd: soft, less tender.  Non distended.   Ext - no c/c/e.   Skin - looks pale.    Lab Results:  Recent Labs    07/10/19 0521 07/10/19 1254  WBC 28.0* 32.2*  HGB 10.8* 10.5*  HCT 34.6* 33.6*  PLT 676* 663*   BMET Recent Labs    07/10/19 0521 07/11/19 0443 07/12/19 0410  NA 131* 130*  --   K 3.9 3.8  --   CL 93* 93*  --   CO2 29 30  --   GLUCOSE 124* 114*  --   BUN 10 11  --   CREATININE 0.49* 0.46* 0.43*  CALCIUM 8.3* 7.9*  --    PT/INR No results for input(s): LABPROT, INR in the last 72 hours. CMP     Component Value Date/Time   NA 130 (L) 07/11/2019 0443   K 3.8 07/11/2019 0443   CL 93 (L) 07/11/2019 0443   CO2 30 07/11/2019 0443   GLUCOSE 114 (H) 07/11/2019 0443   BUN 11 07/11/2019 0443   CREATININE 0.43 (L) 07/12/2019 0410   CALCIUM 7.9 (L) 07/11/2019 0443   PROT 5.4 (L) 07/11/2019 0443   ALBUMIN 1.4 (L) 07/11/2019 0443   AST 36 07/11/2019 0443   ALT 34 07/11/2019 0443   ALKPHOS 172 (H) 07/11/2019 0443   BILITOT 0.5 07/11/2019 0443     GFRNONAA >60 07/12/2019 0410   GFRAA >60 07/12/2019 0410   Lipase     Component Value Date/Time   LIPASE 122 (H) 07/08/2019 0403       Studies/Results: Ct Chest W Contrast  Result Date: 07/10/2019 CLINICAL DATA:  Pleural effusion, abdominal pain, pancreatitis, increased WBC EXAM: CT CHEST, ABDOMEN, AND PELVIS WITH CONTRAST TECHNIQUE: Multidetector CT imaging of the chest, abdomen and pelvis was performed following the standard protocol during bolus administration of intravenous contrast. 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 Normal heart size. No pericardial effusion. Mediastinum/Nodes: No enlarged mediastinal, hilar, or axillary lymph nodes. Thyroid gland, trachea, and esophagus demonstrate no significant findings. Lungs/Pleura: Moderate left, small right pleural effusion with associated atelectasis or consolidation. Left pleural effusion is slightly decreased compared to prior examination. Musculoskeletal: No chest wall mass or suspicious bone lesions identified. CT ABDOMEN PELVIS FINDINGS Hepatobiliary: No solid liver abnormality is seen.  Mildly coarse, nodular contour of the liver. Status post cholecystectomy. Pancreas: Unremarkable. No pancreatic ductal dilatation or surrounding inflammatory changes. Spleen: Normal in size without significant abnormality. Adrenals/Urinary Tract: Adrenal glands are unremarkable. Kidneys are normal, without renal calculi, solid lesion, or hydronephrosis. There is redemonstrated soft tissue thickening in the posterior perirenal fat, likely related to inflammatory fat necrosis and unchanged from prior. Bladder is unremarkable. Stomach/Bowel: Stomach is within normal limits.  Weighted enteric feeding tube is positioned with tip in the stomach. Appendix appears normal. There are multiple loops of contrast filled although not overtly distended proximal to mid small bowel. There is gas and fluid in the colon to the rectum. Vascular/Lymphatic: Aortic atherosclerosis. Prominent mesenteric lymph nodes and/or fluid in the central small bowel mesentery unchanged from prior (series 2, image 74). Reproductive: No mass or other abnormality. Other: No abdominal wall hernia or abnormality. Redemonstrated small volume ascites. The peritoneum generally appears slightly thickened and enhancing. There are multiple redemonstrated rim enhancing fluid collections about the abdomen. There is a new collection about the anterior left lobe of the liver and gastrohepatic ligament measuring 15.8 x 6.0 cm (series 2, image 58). There are multiple additional fluid collections which have generally decreased in size, most notably anterior to the pancreatic body and tail, measuring 12.0 x 3.3 cm, previously 14.9 x 4.3 cm when measured similarly (series 2, image 66) and within the splenogastric ligament measuring 5.8 x 1.9 cm, previously 7.9 x 4.5 cm when measured similarly (series 2, image 46). Musculoskeletal: No acute or significant osseous findings. IMPRESSION: 1. Moderate left, small right pleural effusion with associated atelectasis or consolidation. Left pleural effusion is slightly decreased compared to prior examination. 2. Redemonstrated small volume ascites. The peritoneum generally appears slightly thickened and enhancing. 3. There are multiple redemonstrated rim enhancing fluid collections about the abdomen. There is a new collection about the anterior left lobe of the liver and gastrohepatic ligament measuring 15.8 x 6.0 cm (series 2, image 58). There are multiple additional fluid collections which have generally decreased in size, most notably anterior to the pancreatic body and tail, measuring 12.0 x 3.3  cm, previously 14.9 x 4.3 cm when measured similarly (series 2, image 66) and within the splenogastric ligament measuring 5.8 x 1.9 cm, previously 7.9 x 4.5 cm when measured similarly (series 2, image 46). Fluctuation of fluid collections may to some extent reflect redistribution of fluid within multiple communicating collections or alternately ongoing development of inflammatory fluid collections. The presence or absence of infection is not established. 4.  Other chronic and incidental findings as detailed above. Electronically Signed   By: Eddie Candle M.D.   On: 07/10/2019 16:36   Ct Abdomen Pelvis W Contrast  Result Date: 07/10/2019 CLINICAL DATA:  Pleural effusion, abdominal pain, pancreatitis, increased WBC EXAM: CT CHEST, ABDOMEN, AND PELVIS WITH CONTRAST TECHNIQUE: Multidetector CT imaging of the chest, abdomen and pelvis was performed following the standard protocol during bolus administration of intravenous contrast. 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 Normal heart size. No pericardial effusion. Mediastinum/Nodes: No enlarged mediastinal, hilar, or axillary lymph nodes. Thyroid gland, trachea, and esophagus demonstrate no significant findings. Lungs/Pleura: Moderate left, small right pleural effusion with associated atelectasis or consolidation. Left pleural effusion is slightly decreased compared to prior examination. Musculoskeletal: No  chest wall mass or suspicious bone lesions identified. CT ABDOMEN PELVIS FINDINGS Hepatobiliary: No solid liver abnormality is seen. Mildly coarse, nodular contour of the liver. Status post cholecystectomy. Pancreas: Unremarkable. No pancreatic ductal dilatation or surrounding  inflammatory changes. Spleen: Normal in size without significant abnormality. Adrenals/Urinary Tract: Adrenal glands are unremarkable. Kidneys are normal, without renal calculi, solid lesion, or hydronephrosis. There is redemonstrated soft tissue thickening in the posterior perirenal fat, likely related to inflammatory fat necrosis and unchanged from prior. Bladder is unremarkable. Stomach/Bowel: Stomach is within normal limits. Weighted enteric feeding tube is positioned with tip in the stomach. Appendix appears normal. There are multiple loops of contrast filled although not overtly distended proximal to mid small bowel. There is gas and fluid in the colon to the rectum. Vascular/Lymphatic: Aortic atherosclerosis. Prominent mesenteric lymph nodes and/or fluid in the central small bowel mesentery unchanged from prior (series 2, image 74). Reproductive: No mass or other abnormality. Other: No abdominal wall hernia or abnormality. Redemonstrated small volume ascites. The peritoneum generally appears slightly thickened and enhancing. There are multiple redemonstrated rim enhancing fluid collections about the abdomen. There is a new collection about the anterior left lobe of the liver and gastrohepatic ligament measuring 15.8 x 6.0 cm (series 2, image 58). There are multiple additional fluid collections which have generally decreased in size, most notably anterior to the pancreatic body and tail, measuring 12.0 x 3.3 cm, previously 14.9 x 4.3 cm when measured similarly (series 2, image 66) and within the splenogastric ligament measuring 5.8 x 1.9 cm, previously 7.9 x 4.5 cm when measured similarly (series 2, image 46). Musculoskeletal: No acute or significant osseous findings. IMPRESSION: 1. Moderate left, small right pleural effusion with associated atelectasis or consolidation. Left pleural effusion is slightly decreased compared to prior examination. 2. Redemonstrated small volume ascites. The peritoneum generally  appears slightly thickened and enhancing. 3. There are multiple redemonstrated rim enhancing fluid collections about the abdomen. There is a new collection about the anterior left lobe of the liver and gastrohepatic ligament measuring 15.8 x 6.0 cm (series 2, image 58). There are multiple additional fluid collections which have generally decreased in size, most notably anterior to the pancreatic body and tail, measuring 12.0 x 3.3 cm, previously 14.9 x 4.3 cm when measured similarly (series 2, image 66) and within the splenogastric ligament measuring 5.8 x 1.9 cm, previously 7.9 x 4.5 cm when measured similarly (series 2, image 46). Fluctuation of fluid collections may to some extent reflect redistribution of fluid within multiple communicating collections or alternately ongoing development of inflammatory fluid collections. The presence or absence of infection is not established. 4.  Other chronic and incidental findings as detailed above. Electronically Signed   By: Eddie Candle M.D.   On: 07/10/2019 16:36   Dg Abd Portable 1v  Result Date: 07/11/2019 CLINICAL DATA:  Per RN-patient called that he moved to use the bathroom and he displaced the core track feeding tube from 70 mark to 35. I advanced it back but he is c/o pain and says he feels its is displaced." EXAM: PORTABLE ABDOMEN - 1 VIEW COMPARISON:  Radiograph yesterday at 10:51 a.m. FINDINGS: Tip of the weighted enteric tube is in the left upper quadrant below the diaphragm, in the pre pyloric stomach. Surgical clips in the right abdomen. IMPRESSION: Tip of the weighted enteric tube below the diaphragm in the pre pyloric stomach. Electronically Signed   By: Keith Rake M.D.   On: 07/11/2019 00:45   Dg Abd  Portable 1v  Result Date: 07/10/2019 CLINICAL DATA:  Check feeding catheter placement EXAM: PORTABLE ABDOMEN - 1 VIEW COMPARISON:  07/09/2019 FINDINGS: Weighted feeding catheter is noted with the tip in the distal stomach. Mildly dilated small  bowel is noted similar to that seen on the prior exam. IMPRESSION: Feeding catheter within the distal stomach. Electronically Signed   By: Inez Catalina M.D.   On: 07/10/2019 11:38    Anti-infectives: Anti-infectives (From admission, onward)   Start     Dose/Rate Route Frequency Ordered Stop   07/05/19 1800  ceFEPIme (MAXIPIME) 2 g in sodium chloride 0.9 % 100 mL IVPB  Status:  Discontinued     2 g 200 mL/hr over 30 Minutes Intravenous Every 8 hours 07/05/19 0852 07/05/19 1013   07/05/19 1400  meropenem (MERREM) 1 g in sodium chloride 0.9 % 100 mL IVPB     1 g 200 mL/hr over 30 Minutes Intravenous Every 8 hours 07/05/19 1013 07/15/19 1359   07/05/19 1000  metroNIDAZOLE (FLAGYL) IVPB 500 mg  Status:  Discontinued     500 mg 100 mL/hr over 60 Minutes Intravenous Every 8 hours 07/05/19 0833 07/05/19 1013   07/05/19 0345  vancomycin (VANCOCIN) IVPB 1000 mg/200 mL premix  Status:  Discontinued     1,000 mg 200 mL/hr over 60 Minutes Intravenous  Once 07/05/19 0333 07/05/19 0336   07/05/19 0345  ceFEPIme (MAXIPIME) 2 g in sodium chloride 0.9 % 100 mL IVPB     2 g 200 mL/hr over 30 Minutes Intravenous  Once 07/05/19 0333 07/05/19 1001   07/05/19 0345  vancomycin (VANCOCIN) 1,500 mg in sodium chloride 0.9 % 500 mL IVPB     1,500 mg 250 mL/hr over 120 Minutes Intravenous  Once 07/05/19 0336 07/05/19 1001       Assessment/Plan Idiopathic pancreatitis with fluid collections/pseudocysts -Cortrak placed and on TFs.  Tube terminates in the stomach.  Would benefit from tube being post pyloric -WBC was continuing to rise.  Not checked today. Right sided pain likely secondary to newer fluid collection FEN -IVFs,TFs via Cortrak VTE -Lovenox ID -Merrem  Pt wants to [facility] for drains/interventions.  Will sign off.  Call for further questions.     LOS: 7 days   Milus Height, MD FACS Surgical Oncology, General Surgery, Trauma and Ward Surgery,  [place] [phone] Check [email or web address], password [facility] for coverage night/weekend

## 2019-07-12 NOTE — Progress Notes (Signed)
UNASSIGNED PATIENT Subjective: Kyle Mooney is a 48 year old white male with recurrent pancreatitis and pseudocyst who has been followed at [facility] he developed some right upper quadrant pain and was found to have a new fluid collection there which interventional radiology had plan to tap but the patient had refused to do this for now is awaiting transfer to [facility] as soon as a bed is available.  He feels somewhat better today and his nausea has improved a little.  Objective: Vital signs in last 24 hours: Temp:  [98.2 F (36.8 C)-99 F (37.2 C)] 98.3 F (36.8 C) (08/23 0555) Pulse Rate:  [103-115] 105 (08/23 0555) Resp:  [14-22] 20 (08/23 0810) BP: (120-136)/(76-88) 124/77 (08/23 0555) SpO2:  [92 %-98 %] 97 % (08/23 0810) Last BM Date: 07/10/19  Intake/Output from previous day: 08/22 0701 - 08/23 0700 In: 1841.1 [P.O.:70; I.V.:10; NG/GT:1403.2; IV Piggyback:357.9] Out: 725 [Urine:725] Intake/Output this shift: No intake/output data recorded.  General appearance: cooperative, appears stated age, fatigued, no distress and pale Resp: clear to auscultation bilaterally Cardio: regular rate and rhythm, S1, S2 normal, no murmur, click, rub or gallop GI: soft, non-tender; bowel sounds normal; no masses,  no organomegaly Extremities: extremities normal, atraumatic, no cyanosis or edema  Lab Results: Recent Labs    07/10/19 0521 07/10/19 1254  WBC 28.0* 32.2*  HGB 10.8* 10.5*  HCT 34.6* 33.6*  PLT 676* 663*   BMET Recent Labs    07/10/19 0521 07/11/19 0443 07/12/19 0410  NA 131* 130*  --   K 3.9 3.8  --   CL 93* 93*  --   CO2 29 30  --   GLUCOSE 124* 114*  --   BUN 10 11  --   CREATININE 0.49* 0.46* 0.43*  CALCIUM 8.3* 7.9*  --    LFT Recent Labs    07/11/19 0443  PROT 5.4*  ALBUMIN 1.4*  AST 36  ALT 34  ALKPHOS 172*  BILITOT 0.5   PT/INR No results for input(s): LABPROT, INR in the last 72 hours. Hepatitis Panel No results for  input(s): HEPBSAG, HCVAB, HEPAIGM, HEPBIGM in the last 72 hours. C-Diff No results for input(s): CDIFFTOX in the last 72 hours. No results for input(s): CDIFFPCR in the last 72 hours. Fecal Lactopherrin No results for input(s): FECLLACTOFRN in the last 72 hours.  Studies/Results: Ct Chest W Contrast  Result Date: 07/10/2019 CLINICAL DATA:  Pleural effusion, abdominal pain, pancreatitis, increased WBC EXAM: CT CHEST, ABDOMEN, AND PELVIS WITH CONTRAST TECHNIQUE: Multidetector CT imaging of the chest, abdomen and pelvis was performed following the standard protocol during bolus administration of intravenous contrast. 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 Normal heart size. No pericardial effusion. Mediastinum/Nodes: No enlarged mediastinal, hilar, or axillary lymph nodes. Thyroid gland, trachea, and esophagus demonstrate no significant findings. Lungs/Pleura: Moderate left, small right pleural effusion with associated atelectasis or consolidation. Left pleural effusion is slightly decreased compared to prior examination. Musculoskeletal: No chest wall mass or suspicious bone lesions identified. CT ABDOMEN PELVIS FINDINGS Hepatobiliary: No solid liver abnormality is seen. Mildly coarse, nodular contour of the liver. Status post cholecystectomy. Pancreas: Unremarkable. No pancreatic ductal dilatation or surrounding inflammatory changes. Spleen: Normal in size without significant abnormality. Adrenals/Urinary Tract: Adrenal glands are unremarkable. Kidneys are normal,  without renal calculi, solid lesion, or hydronephrosis. There is redemonstrated soft tissue thickening in the posterior perirenal fat, likely related to  inflammatory fat necrosis and unchanged from prior. Bladder is unremarkable. Stomach/Bowel: Stomach is within normal limits. Weighted enteric feeding tube is positioned with tip in the stomach. Appendix appears normal. There are multiple loops of contrast filled although not overtly distended proximal to mid small bowel. There is gas and fluid in the colon to the rectum. Vascular/Lymphatic: Aortic atherosclerosis. Prominent mesenteric lymph nodes and/or fluid in the central small bowel mesentery unchanged from prior (series 2, image 74). Reproductive: No mass or other abnormality. Other: No abdominal wall hernia or abnormality. Redemonstrated small volume ascites. The peritoneum generally appears slightly thickened and enhancing. There are multiple redemonstrated rim enhancing fluid collections about the abdomen. There is a new collection about the anterior left lobe of the liver and gastrohepatic ligament measuring 15.8 x 6.0 cm (series 2, image 58). There are multiple additional fluid collections which have generally decreased in size, most notably anterior to the pancreatic body and tail, measuring 12.0 x 3.3 cm, previously 14.9 x 4.3 cm when measured similarly (series 2, image 66) and within the splenogastric ligament measuring 5.8 x 1.9 cm, previously 7.9 x 4.5 cm when measured similarly (series 2, image 46). Musculoskeletal: No acute or significant osseous findings. IMPRESSION: 1. Moderate left, small right pleural effusion with associated atelectasis or consolidation. Left pleural effusion is slightly decreased compared to prior examination. 2. Redemonstrated small volume ascites. The peritoneum generally appears slightly thickened and enhancing. 3. There are multiple redemonstrated rim enhancing fluid collections about the abdomen. There is a new collection about the anterior left lobe of the liver and gastrohepatic ligament measuring 15.8 x 6.0 cm (series 2, image 58). There are multiple additional fluid  collections which have generally decreased in size, most notably anterior to the pancreatic body and tail, measuring 12.0 x 3.3 cm, previously 14.9 x 4.3 cm when measured similarly (series 2, image 66) and within the splenogastric ligament measuring 5.8 x 1.9 cm, previously 7.9 x 4.5 cm when measured similarly (series 2, image 46). Fluctuation of fluid collections may to some extent reflect redistribution of fluid within multiple communicating collections or alternately ongoing development of inflammatory fluid collections. The presence or absence of infection is not established. 4.  Other chronic and incidental findings as detailed above. Electronically Signed   By: Eddie Candle M.D.   On: 07/10/2019 16:36   Ct Abdomen Pelvis W Contrast  Result Date: 07/10/2019 CLINICAL DATA:  Pleural effusion, abdominal pain, pancreatitis, increased WBC EXAM: CT CHEST, ABDOMEN, AND PELVIS WITH CONTRAST TECHNIQUE: Multidetector CT imaging of the chest, abdomen and pelvis was performed following the standard protocol during bolus administration of intravenous contrast. 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 Normal heart size. No pericardial effusion. Mediastinum/Nodes: No enlarged mediastinal, hilar, or axillary lymph nodes. Thyroid gland, trachea, and esophagus demonstrate no significant findings. Lungs/Pleura: Moderate left, small right pleural effusion with associated atelectasis or consolidation. Left pleural effusion is slightly decreased compared to prior examination. Musculoskeletal: No chest wall mass or suspicious bone lesions identified. CT ABDOMEN PELVIS FINDINGS Hepatobiliary: No solid liver abnormality is seen. Mildly coarse,  nodular contour of the liver. Status post cholecystectomy. Pancreas: Unremarkable. No pancreatic ductal dilatation  or surrounding inflammatory changes. Spleen: Normal in size without significant abnormality. Adrenals/Urinary Tract: Adrenal glands are unremarkable. Kidneys are normal, without renal calculi, solid lesion, or hydronephrosis. There is redemonstrated soft tissue thickening in the posterior perirenal fat, likely related to inflammatory fat necrosis and unchanged from prior. Bladder is unremarkable. Stomach/Bowel: Stomach is within normal limits. Weighted enteric feeding tube is positioned with tip in the stomach. Appendix appears normal. There are multiple loops of contrast filled although not overtly distended proximal to mid small bowel. There is gas and fluid in the colon to the rectum. Vascular/Lymphatic: Aortic atherosclerosis. Prominent mesenteric lymph nodes and/or fluid in the central small bowel mesentery unchanged from prior (series 2, image 74). Reproductive: No mass or other abnormality. Other: No abdominal wall hernia or abnormality. Redemonstrated small volume ascites. The peritoneum generally appears slightly thickened and enhancing. There are multiple redemonstrated rim enhancing fluid collections about the abdomen. There is a new collection about the anterior left lobe of the liver and gastrohepatic ligament measuring 15.8 x 6.0 cm (series 2, image 58). There are multiple additional fluid collections which have generally decreased in size, most notably anterior to the pancreatic body and tail, measuring 12.0 x 3.3 cm, previously 14.9 x 4.3 cm when measured similarly (series 2, image 66) and within the splenogastric ligament measuring 5.8 x 1.9 cm, previously 7.9 x 4.5 cm when measured similarly (series 2, image 46). Musculoskeletal: No acute or significant osseous findings. IMPRESSION: 1. Moderate left, small right pleural effusion with associated atelectasis or consolidation. Left  pleural effusion is slightly decreased compared to prior examination. 2. Redemonstrated small volume ascites. The peritoneum generally appears slightly thickened and enhancing. 3. There are multiple redemonstrated rim enhancing fluid collections about the abdomen. There is a new collection about the anterior left lobe of the liver and gastrohepatic ligament measuring 15.8 x 6.0 cm (series 2, image 58). There are multiple additional fluid collections which have generally decreased in size, most notably anterior to the pancreatic body and tail, measuring 12.0 x 3.3 cm, previously 14.9 x 4.3 cm when measured similarly (series 2, image 66) and within the splenogastric ligament measuring 5.8 x 1.9 cm, previously 7.9 x 4.5 cm when measured similarly (series 2, image 46). Fluctuation of fluid collections may to some extent reflect redistribution of fluid within multiple communicating collections or alternately ongoing development of inflammatory fluid collections. The presence or absence of infection is not established. 4.  Other chronic and incidental findings as detailed above. Electronically Signed   By: Eddie Candle M.D.   On: 07/10/2019 16:36   Dg Abd Portable 1v  Result Date: 07/11/2019 CLINICAL DATA:  Per RN-patient called that he moved to use the bathroom and he displaced the core track feeding tube from 70 mark to 35. I advanced it back but he is c/o pain and says he feels its is displaced." EXAM: PORTABLE ABDOMEN - 1 VIEW COMPARISON:  Radiograph yesterday at 10:51 a.m. FINDINGS: Tip of the weighted enteric tube is in the left upper quadrant below the diaphragm, in the pre pyloric stomach. Surgical clips in the right abdomen. IMPRESSION: Tip of the weighted enteric tube below the diaphragm in the pre pyloric stomach. Electronically Signed   By: Keith Rake M.D.   On: 07/11/2019 00:45   Dg Abd Portable 1v  Result Date: 07/10/2019 CLINICAL DATA:  Check feeding catheter placement EXAM: PORTABLE ABDOMEN -  1 VIEW COMPARISON:  07/09/2019 FINDINGS: Weighted feeding catheter is noted with the tip in the distal stomach.  Mildly dilated small bowel is noted similar to that seen on the prior exam. IMPRESSION: Feeding catheter within the distal stomach. Electronically Signed   By: Inez Catalina M.D.   On: 07/10/2019 11:38    Medications: I have reviewed the patient's current medications.  Assessment/Plan: 1) Acute acute recurrent pancreatitis with pseudocysts and and multiple fluid collections in the abdomen-on tube feeds.  We will continue supportive care and await transfer to [facility] soon as possible. 2) Severe protein calorie malnutrition malnutrition. 3) History of alcohol and tobacco abuse  LOS: 7 days   Juanita Craver 07/12/2019, 8:15 AM

## 2019-07-12 NOTE — Discharge Summary (Signed)
Physician Discharge Summary  Svanik Geiser [id number] DOB: 1970/11/22 DOA: 07/05/2019  PCP: Patient, No Pcp Per  Admit date: 07/05/2019 Discharge date: 07/12/2019  Accepting physician: Dr. Peggye Form   Admitted From: Home Disposition: [facility]  Recommendations for Outpatient Follow-up:  Follow-up with GI and surgery at [facility]   Equipment/Devices: 2 L O2 via nasal cannula  Discharge Condition: Guarded CODE STATUS: Full code Diet recommendation: Enteral feeding    Discharge Diagnoses:  Principal Problem:   Idiopathic acute pancreatitis with uninfected necrosis  Active Problems:   Recurrent left pleural effusion   GERD (gastroesophageal reflux disease)   IBS (irritable bowel syndrome)   Pancreatitis, recurrent   Acute on chronic pancreatitis ([place])   Tobacco abuse   Alcohol dependence, binge pattern ([place])   History of cholecystectomy   Protein-calorie malnutrition, severe ([place])   Infected pseudocyst of pancreas   Brief narrative/HPI  48 year old male with recurrent pancreatitis with developing pseudocyst, IBS and GERD who was followed at [facility] for his idiopathic pancreatitis and pancreatic cyst presented with recurrent epigastric pain suspicious for acute on chronic pancreatitis.  He was last hospitalized at [facility] on 06/02/2019 with necrotizing pancreatitis.  CT angiogram of the chest was negative for PE but showed large layering left pleural effusion with near complete lower lobe collapse.  IR consulted and underwent thoracentesis of the left pleural effusion.  Surgery consulted for evaluation of pseudocyst and recommended to wait for 6 weeks for the pseudocyst to mature and either follow-up with Dr. gross or at [facility] for further evaluation. Given his persistent pain he has been requiring IV morphine PCA. Hospital course prolonged due to persistent abdominal pain requiring morphine PCA and development of new  likely infected pseudocyst  Hospital course  Principal Problem:   Idiopathic acute pancreatitis with infected pseudocyst On morphine PCA and empiric IV antibiotics. GI and surgery following closely.  Surgery recommends ideally the pseudocyst will need to measure for at least 6 weeks to attempt cystoscopy gastrostomy.   Has very poor nutrition with low albumin.  Core track placed and enteral nutrition started.  Now has  right mid back pain possibly due to the new pseudocyst. CT of the abdomen repeated on 8/21 showing multiple fluid collection with a new collection near the anterior left lobe of liver and gastrohepatic ligament measuring 5.8 x 6 cm concern for infection given progressive leukocytosis. IR consulted to evaluate for percutaneous drainage but patient refused wanting to be transferred to [facility].  wbc still high (32.8k). afebrile. continue meropenem.      Active Problems:   Hyponatremia Possibly prerenal with dehydration.  Improved with IV fluids.  Severe protein calorie malnutrition ([place]) Coretrak placed and feeding started.  Dietitian following.  Tobacco abuse Counseled on cessation  Alcohol abuse No signs of withdrawal.     Leukocytosis and thrombocytosis Secondary to infected cyst.  Exudative pleural effusion, left  thoracentesis done on 8/17 with improvement noted on follow up CT from 8/21. On empiric meropenem.     Family Communication  : None  Disposition Plan  : [facility]  \  Consults  : GI/surgery  Procedures  : CT abdomen pelvis, left thoracentesis   Discharge Instructions   Allergies as of 07/12/2019      Reactions   Codeine Rash   Other Other (See Comments)   "prolixa, an antidepressant from the 80s"   Fluphenazine Hcl Rash      Medication List    STOP taking these  medications   amoxicillin-clavulanate 875-125 MG tablet Commonly known as: Augmentin   multivitamin with minerals Tabs tablet   Oxycodone HCl 10 MG  Tabs   potassium chloride 10 MEQ tablet Commonly known as: K-DUR     TAKE these medications   acetaminophen 650 MG CR tablet Commonly known as: TYLENOL Take 325 mg by mouth every 8 (eight) hours as needed for pain.   Dexilant 60 MG capsule Generic drug: dexlansoprazole Take 60 mg by mouth daily.   dicyclomine 10 MG capsule Commonly known as: BENTYL Take 10 mg by mouth 3 (three) times daily.   feeding supplement (OSMOLITE 1.5 CAL) Liqd Place 1,000 mLs into feeding tube continuous.   feeding supplement (PRO-STAT SUGAR FREE 64) Liqd Place 30 mLs into feeding tube 2 (two) times daily.   furosemide 40 MG tablet Commonly known as: Lasix Take 1 tablet (40 mg total) by mouth daily for 5 days.   meropenem 1 g in sodium chloride 0.9 % 100 mL Inject 1 g into the vein every 8 (eight) hours.   metoprolol tartrate 25 MG tablet Commonly known as: LOPRESSOR Take 1 tablet (25 mg total) by mouth 2 (two) times daily.   morphine 2 mg/mL injection Commonly known as: MORPHINE Inject 0.5 mLs (1 mg total) into the vein every 4 (four) hours.   multivitamin Liqd Place 15 mLs into feeding tube daily. Start taking on: July 13, 2019   ondansetron 4 MG tablet Commonly known as: Zofran Take 1 tablet (4 mg total) by mouth daily as needed for nausea or vomiting.   polyethylene glycol 17 g packet Commonly known as: MIRALAX / GLYCOLAX Take 17 g by mouth 2 (two) times daily.   psyllium 0.52 g capsule Commonly known as: REGULOID Take 0.52 g by mouth daily.      Follow-up [facility] Follow up.          Allergies  Allergen Reactions  . Codeine Rash  . Other Other (See Comments)    "prolixa, an antidepressant from the 80s"  . Fluphenazine Hcl Rash     \   Procedures/Studies: Dg Chest 1 View  Result Date: 06/26/2019 CLINICAL DATA:  Status post thoracentesis. EXAM: CHEST  1 VIEW COMPARISON:  06/24/2019 FINDINGS: Interval near complete evacuation  of the left pleural fluid collection. A small residual effusion remains. No evidence of a postprocedural pneumothorax. Left basilar atelectasis is noted. The right lung is clear. No right-sided effusion. IMPRESSION: Interval near complete evacuation of the left pleural fluid collection with left lower lobe atelectasis. No postprocedural pneumothorax. Electronically Signed   By: Marijo Sanes M.D.   On: 06/26/2019 09:40   Dg Abd 1 View  Result Date: 06/21/2019 CLINICAL DATA:  Pt reports extreme abdominal pain across entire abdomen. Reports hx of pancreatitis. Pain with any movements. EXAM: ABDOMEN - 1 VIEW COMPARISON:  None. FINDINGS: The bowel gas pattern is normal. Cholecystectomy clips. No radio-opaque calculi or other significant radiographic abnormality are seen. IMPRESSION: Negative. Electronically Signed   By: Lucrezia Europe M.D.   On: 06/21/2019 11:04   Ct Chest W Contrast  Result Date: 07/10/2019 CLINICAL DATA:  Pleural effusion, abdominal pain, pancreatitis, increased WBC EXAM: CT CHEST, ABDOMEN, AND PELVIS WITH CONTRAST TECHNIQUE: Multidetector CT imaging of the chest, abdomen and pelvis was performed following the standard protocol during bolus administration of intravenous contrast. 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 Normal heart size. No pericardial effusion. Mediastinum/Nodes: No enlarged mediastinal, hilar, or axillary lymph nodes. Thyroid gland, trachea, and esophagus demonstrate no significant findings. Lungs/Pleura: Moderate left, small right pleural effusion with associated atelectasis or consolidation. Left pleural effusion is slightly decreased compared to prior examination. Musculoskeletal: No chest wall  mass or suspicious bone lesions identified. CT ABDOMEN PELVIS FINDINGS Hepatobiliary: No solid liver abnormality is seen. Mildly coarse, nodular contour of the liver. Status post cholecystectomy. Pancreas: Unremarkable. No pancreatic ductal dilatation or surrounding inflammatory changes. Spleen: Normal in size without significant abnormality. Adrenals/Urinary Tract: Adrenal glands are unremarkable. Kidneys are normal, without renal calculi, solid lesion, or hydronephrosis. There is redemonstrated soft tissue thickening in the posterior perirenal fat, likely related to inflammatory fat necrosis and unchanged from prior. Bladder is unremarkable. Stomach/Bowel: Stomach is within normal limits. Weighted enteric feeding tube is positioned with tip in the stomach. Appendix appears normal. There are multiple loops of contrast filled although not overtly distended proximal to mid small bowel. There is gas and fluid in the colon to the rectum. Vascular/Lymphatic: Aortic atherosclerosis. Prominent mesenteric lymph nodes and/or fluid in the central small bowel mesentery unchanged from prior (series 2, image 74). Reproductive: No mass or other abnormality. Other: No abdominal wall hernia or abnormality. Redemonstrated small volume ascites. The peritoneum generally appears slightly thickened and enhancing. There are multiple redemonstrated rim enhancing fluid collections about the abdomen. There is a new collection about the anterior left lobe of the liver and gastrohepatic ligament measuring 15.8 x 6.0 cm (series 2, image 58). There are multiple additional fluid collections which have generally decreased in size, most notably anterior to the pancreatic body and tail, measuring 12.0 x 3.3 cm, previously 14.9 x 4.3 cm when measured similarly (series 2, image 66) and within the splenogastric ligament measuring 5.8 x 1.9 cm, previously 7.9 x 4.5 cm when measured similarly (series 2, image 46). Musculoskeletal: No acute or  significant osseous findings. IMPRESSION: 1. Moderate left, small right pleural effusion with associated atelectasis or consolidation. Left pleural effusion is slightly decreased compared to prior examination. 2. Redemonstrated small volume ascites. The peritoneum generally appears slightly thickened and enhancing. 3. There are multiple redemonstrated rim enhancing fluid collections about the abdomen. There is a new collection about the anterior left lobe of the liver and gastrohepatic ligament measuring 15.8 x 6.0 cm (series 2, image 58). There are multiple additional fluid collections which have generally decreased in size, most notably anterior to the pancreatic body and tail, measuring 12.0 x 3.3 cm, previously 14.9 x 4.3 cm when measured similarly (series 2, image 66) and within the splenogastric ligament measuring 5.8 x 1.9 cm, previously 7.9 x 4.5 cm when measured similarly (series 2, image 46). Fluctuation of fluid collections may to some extent reflect redistribution of fluid within multiple communicating collections or alternately ongoing development of inflammatory fluid collections. The presence or absence of infection is not established. 4.  Other chronic and incidental findings as detailed above. Electronically Signed   By: Eddie Candle M.D.   On: 07/10/2019 16:36   Ct Chest W Contrast  Result Date: 06/25/2019 CLINICAL DATA:  Shortness of breath, acute on chronic pancreatitis with multiple peripancreatic fluid collections, recurrent epigastric pain EXAM: CT CHEST, ABDOMEN, AND PELVIS WITH CONTRAST TECHNIQUE: Multidetector CT imaging of the chest, abdomen and pelvis was performed following the standard protocol  during bolus administration of intravenous contrast. 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 Normal  heart size. No pericardial effusion. Mediastinum/Nodes: No enlarged mediastinal, hilar, or axillary lymph nodes. Thyroid gland, trachea, and esophagus demonstrate no significant findings. Lungs/Pleura: There is a large left pleural effusion with associated atelectasis or consolidation, enlarged compared to prior examination. There is a new, trace right pleural effusion. There are scattered subpleural ground-glass pulmonary opacities, most conspicuous in the right upper lobe. Musculoskeletal: No chest wall mass or suspicious bone lesions identified. CT ABDOMEN PELVIS FINDINGS Hepatobiliary: No focal liver abnormality is seen. Status post cholecystectomy. No biliary dilatation. Pancreas: Redemonstrated extensive retroperitoneal inflammation and multiple peripancreatic fluid collections. There is a new discrete fluid collection or component adjacent to the greater curvature of the stomach and spleen measuring approximately 6.2 x 2.7 cm (series 3, image 52). There is an additional new fluid collection within or adjacent to the omentum measuring 4.7 x 2.5 cm (series 3, image 75). There is an additional new fluid collection within the small bowel mesentery measuring 5.0 x 3.2 cm (series 3, image 86). Other fluid collections are not significantly changed, for example anterior to the pancreatic neck (series 3, image 67), adjacent to the porta hepatis (series 3, image 67), adjacent to the left kidney and splenic flexure (series 3, image 73), and posterior to the left kidney (series 3, image 65). Spleen: Splenomegaly, maximum span 14.4 cm. Adrenals/Urinary Tract: Adrenal glands are unremarkable. Kidneys are normal, without renal calculi, solid lesion, or hydronephrosis. Bladder is unremarkable. Stomach/Bowel: Stomach is within normal limits. Appendix appears normal. Inflammatory thickening of the transverse colon (series 3, image 81), similar to prior examination, and of the mid small bowel, particularly a segment in the  anterior abdomen (series 3, image 95). Vascular/Lymphatic: Aortic atherosclerosis. No enlarged abdominal or pelvic lymph nodes. Reproductive: No mass or other abnormality. Other: Extensive anasarca. Small volume ascites, similar to prior examination. Musculoskeletal: No acute or significant osseous findings. IMPRESSION: 1. There is a large left pleural effusion with associated atelectasis or consolidation, enlarged compared to prior examination. There is a new, trace right pleural effusion. 2. There are scattered subpleural ground-glass pulmonary opacities, most conspicuous in the right upper lobe. These are nonspecific and infectious or inflammatory. 3. Redemonstrated extensive retroperitoneal inflammation and multiple peripancreatic fluid collections. There is a new discrete fluid collection or component adjacent to the greater curvature of the stomach and spleen measuring approximately 6.2 x 2.7 cm (series 3, image 52). There is an additional new fluid collection within or adjacent to the omentum measuring 4.7 x 2.5 cm (series 3, image 75). There is an additional new fluid collection within the small bowel mesentery measuring 5.0 x 3.2 cm (series 3, image 86). 4. Other fluid collections are not significantly changed, for example anterior to the pancreatic neck (series 3, image 67), adjacent to the porta hepatis (series 3, image 67), adjacent to the left kidney and splenic flexure (series 3, image 73), and posterior to the left kidney (series 3, image 65). 5. Inflammatory thickening of the transverse colon (series 3, image 81), similar to prior examination, and of the mid small bowel, particularly a segment in the anterior abdomen (series 3, image 95). 6. Extensive anasarca. Small volume ascites, similar to prior examination. Electronically Signed   By: Eddie Candle M.D.   On: 06/25/2019 14:18   Ct Angio  Chest Pe W/cm &/or Wo Cm  Result Date: 07/05/2019 CLINICAL DATA:  Chest pain.  Complicated pancreatitis.  EXAM: CT ANGIOGRAPHY CHEST WITH CONTRAST TECHNIQUE: Multidetector CT imaging of the chest was performed using the standard protocol during bolus administration of intravenous contrast. Multiplanar CT image reconstructions and MIPs were obtained to evaluate the vascular anatomy. CONTRAST:  19mL OMNIPAQUE IOHEXOL 350 MG/ML SOLN COMPARISON:  Chest CT from 10 days ago FINDINGS: Cardiovascular: Satisfactory opacification of the pulmonary arteries to the segmental level. No evidence of pulmonary embolism when accounting for intermittent motion artifact. Normal heart size. No pericardial effusion. Aberrant right subclavian artery Mediastinum/Nodes: Negative for adenopathy or mass. Lungs/Pleura: Large left pleural effusion that is layering. Trace right pleural fluid. Near complete collapse of the left lower lobe. No consolidation or edema. Upper Abdomen: As reported separately. Musculoskeletal: No acute or aggressive finding Review of the MIP images confirms the above findings. IMPRESSION: 1. Negative for pulmonary embolism. 2. Large layering left pleural effusion with near complete lower lobe collapse. Electronically Signed   By: Monte Fantasia M.D.   On: 07/05/2019 07:16   Ct Abdomen Pelvis W Contrast  Result Date: 07/10/2019 CLINICAL DATA:  Pleural effusion, abdominal pain, pancreatitis, increased WBC EXAM: CT CHEST, ABDOMEN, AND PELVIS WITH CONTRAST TECHNIQUE: Multidetector CT imaging of the chest, abdomen and pelvis was performed following the standard protocol during bolus administration of intravenous contrast. 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 Normal heart size. No pericardial effusion. Mediastinum/Nodes: No enlarged  mediastinal, hilar, or axillary lymph nodes. Thyroid gland, trachea, and esophagus demonstrate no significant findings. Lungs/Pleura: Moderate left, small right pleural effusion with associated atelectasis or consolidation. Left pleural effusion is slightly decreased compared to prior examination. Musculoskeletal: No chest wall mass or suspicious bone lesions identified. CT ABDOMEN PELVIS FINDINGS Hepatobiliary: No solid liver abnormality is seen. Mildly coarse, nodular contour of the liver. Status post cholecystectomy. Pancreas: Unremarkable. No pancreatic ductal dilatation or surrounding inflammatory changes. Spleen: Normal in size without significant abnormality. Adrenals/Urinary Tract: Adrenal glands are unremarkable. Kidneys are normal, without renal calculi, solid lesion, or hydronephrosis. There is redemonstrated soft tissue thickening in the posterior perirenal fat, likely related to inflammatory fat necrosis and unchanged from prior. Bladder is unremarkable. Stomach/Bowel: Stomach is within normal limits. Weighted enteric feeding tube is positioned with tip in the stomach. Appendix appears normal. There are multiple loops of contrast filled although not overtly distended proximal to mid small bowel. There is gas and fluid in the colon to the rectum. Vascular/Lymphatic: Aortic atherosclerosis. Prominent mesenteric lymph nodes and/or fluid in the central small bowel mesentery unchanged from prior (series 2, image 74). Reproductive: No mass or other abnormality. Other: No abdominal wall hernia or abnormality. Redemonstrated small volume ascites. The peritoneum generally appears slightly thickened and enhancing. There are multiple redemonstrated rim enhancing fluid collections about the abdomen. There is a new collection about the anterior left lobe of the liver and gastrohepatic ligament measuring 15.8 x 6.0 cm (series 2, image 58). There are multiple additional fluid collections which have generally decreased  in size, most notably anterior to the pancreatic body and tail, measuring 12.0 x 3.3 cm, previously 14.9 x 4.3 cm when measured similarly (series 2, image 66) and within the splenogastric ligament measuring 5.8 x 1.9 cm, previously 7.9  x 4.5 cm when measured similarly (series 2, image 46). Musculoskeletal: No acute or significant osseous findings. IMPRESSION: 1. Moderate left, small right pleural effusion with associated atelectasis or consolidation. Left pleural effusion is slightly decreased compared to prior examination. 2. Redemonstrated small volume ascites. The peritoneum generally appears slightly thickened and enhancing. 3. There are multiple redemonstrated rim enhancing fluid collections about the abdomen. There is a new collection about the anterior left lobe of the liver and gastrohepatic ligament measuring 15.8 x 6.0 cm (series 2, image 58). There are multiple additional fluid collections which have generally decreased in size, most notably anterior to the pancreatic body and tail, measuring 12.0 x 3.3 cm, previously 14.9 x 4.3 cm when measured similarly (series 2, image 66) and within the splenogastric ligament measuring 5.8 x 1.9 cm, previously 7.9 x 4.5 cm when measured similarly (series 2, image 46). Fluctuation of fluid collections may to some extent reflect redistribution of fluid within multiple communicating collections or alternately ongoing development of inflammatory fluid collections. The presence or absence of infection is not established. 4.  Other chronic and incidental findings as detailed above. Electronically Signed   By: Eddie Candle M.D.   On: 07/10/2019 16:36   Ct Abdomen Pelvis W Contrast  Result Date: 06/25/2019 CLINICAL DATA:  Shortness of breath, acute on chronic pancreatitis with multiple peripancreatic fluid collections, recurrent epigastric pain EXAM: CT CHEST, ABDOMEN, AND PELVIS WITH CONTRAST TECHNIQUE: Multidetector CT imaging of the chest, abdomen and pelvis was  performed following the standard protocol during bolus administration of intravenous contrast. 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 Normal heart size. No pericardial effusion. Mediastinum/Nodes: No enlarged mediastinal, hilar, or axillary lymph nodes. Thyroid gland, trachea, and esophagus demonstrate no significant findings. Lungs/Pleura: There is a large left pleural effusion with associated atelectasis or consolidation, enlarged compared to prior examination. There is a new, trace right pleural effusion. There are scattered subpleural ground-glass pulmonary opacities, most conspicuous in the right upper lobe. Musculoskeletal: No chest wall mass or suspicious bone lesions identified. CT ABDOMEN PELVIS FINDINGS Hepatobiliary: No focal liver abnormality is seen. Status post cholecystectomy. No biliary dilatation. Pancreas: Redemonstrated extensive retroperitoneal inflammation and multiple peripancreatic fluid collections. There is a new discrete fluid collection or component adjacent to the greater curvature of the stomach and spleen measuring approximately 6.2 x 2.7 cm (series 3, image 52). There is an additional new fluid collection within or adjacent to the omentum measuring 4.7 x 2.5 cm (series 3, image 75). There is an additional new fluid collection within the small bowel mesentery measuring 5.0 x 3.2 cm (series 3, image 86). Other fluid collections are not significantly changed, for example anterior to the pancreatic neck (series 3, image 67), adjacent to the porta hepatis (series 3, image 67), adjacent to the left kidney and splenic flexure (series 3, image 73), and posterior to the left kidney (series 3, image 65). Spleen: Splenomegaly, maximum span 14.4 cm. Adrenals/Urinary Tract: Adrenal glands are unremarkable.  Kidneys are normal, without renal calculi, solid lesion, or hydronephrosis. Bladder is unremarkable. Stomach/Bowel: Stomach is within normal limits. Appendix appears normal. Inflammatory thickening of the transverse colon (series 3, image 81), similar to prior examination, and of the mid small bowel, particularly a segment in the anterior abdomen (series 3, image 95). Vascular/Lymphatic: Aortic atherosclerosis. No enlarged abdominal or pelvic lymph nodes. Reproductive: No mass or  other abnormality. Other: Extensive anasarca. Small volume ascites, similar to prior examination. Musculoskeletal: No acute or significant osseous findings. IMPRESSION: 1. There is a large left pleural effusion with associated atelectasis or consolidation, enlarged compared to prior examination. There is a new, trace right pleural effusion. 2. There are scattered subpleural ground-glass pulmonary opacities, most conspicuous in the right upper lobe. These are nonspecific and infectious or inflammatory. 3. Redemonstrated extensive retroperitoneal inflammation and multiple peripancreatic fluid collections. There is a new discrete fluid collection or component adjacent to the greater curvature of the stomach and spleen measuring approximately 6.2 x 2.7 cm (series 3, image 52). There is an additional new fluid collection within or adjacent to the omentum measuring 4.7 x 2.5 cm (series 3, image 75). There is an additional new fluid collection within the small bowel mesentery measuring 5.0 x 3.2 cm (series 3, image 86). 4. Other fluid collections are not significantly changed, for example anterior to the pancreatic neck (series 3, image 67), adjacent to the porta hepatis (series 3, image 67), adjacent to the left kidney and splenic flexure (series 3, image 73), and posterior to the left kidney (series 3, image 65). 5. Inflammatory thickening of the transverse colon (series 3, image 81), similar to prior examination, and of the mid small bowel,  particularly a segment in the anterior abdomen (series 3, image 95). 6. Extensive anasarca. Small volume ascites, similar to prior examination. Electronically Signed   By: Eddie Candle M.D.   On: 06/25/2019 14:18   Ct Abdomen Pelvis W Contrast  Result Date: 06/20/2019 CLINICAL DATA:  Abdominal pain.  Neutropenia. EXAM: CT ABDOMEN AND PELVIS WITH CONTRAST TECHNIQUE: Multidetector CT imaging of the abdomen and pelvis was performed using the standard protocol following bolus administration of intravenous contrast. CONTRAST:  164mL OMNIPAQUE IOHEXOL 300 MG/ML  SOLN COMPARISON:  None. FINDINGS: Lower chest: There is a moderate-sized left-sided pleural effusion with near complete collapse of the left lower lobe.The heart size is normal. Hepatobiliary: The liver is normal. Status post cholecystectomy.There is no biliary ductal dilation. Pancreas: There are multiple peripancreatic fluid collections the largest of which measures approximately 3.7 by 1.8 cm. The pancreas appears to enhance symmetrically. Spleen: The spleen is enlarged measuring approximately 14 cm craniocaudad. Adrenals/Urinary Tract: --Adrenal glands: No adrenal hemorrhage. --Right kidney/ureter: No hydronephrosis or perinephric hematoma. --Left kidney/ureter: There is no left-sided hydronephrosis. There is a complex collection in the left posterior pararenal space measuring approximately 8.5 x 2.5 cm. --Urinary bladder: Unremarkable. Stomach/Bowel: --Stomach/Duodenum: There is some wall thickening of the stomach. --Small bowel: No dilatation or inflammation. --Colon: There are soft tissue densities along the descending colon measuring approximately 2.8 x 2.4 cm. There is a collection of the splenic flexure measuring 4.7 x 2.7 cm causing mass effect on the nearby:Marland Kitchen There is wall thickening of the transverse colon without evidence of an obstruction. --Appendix: Not visualized. No right lower quadrant inflammation or free fluid. Vascular/Lymphatic:  Atherosclerotic changes are noted of the abdominal aorta without evidence of an abdominal aortic aneurysm. The portal vein and splenic vein remain patent. The splenic artery remains patent. --No retroperitoneal lymphadenopathy. --No mesenteric lymphadenopathy. --No pelvic or inguinal lymphadenopathy. Reproductive: Unremarkable Other: There is a small volume of free fluid in the pelvis. There are multiple scattered collections in the retroperitoneum and peritoneal cavity. A few these collections demonstrate mild rim enhancement. For example in the left upper quadrant there is a 3.9 x 1.9 cm collection that demonstrates mild peripheral rim enhancement. There is a 3.8 by  4.4 cm collection in the region of the gallbladder fossa. Given the surgical clips in the gallbladder fossa this is favored to represent a loculated fluid collection as opposed to a remnant gallbladder. Musculoskeletal. No acute displaced fractures. IMPRESSION: 1. Overall findings concerning for pancreatitis with multiple loculated fluid collections as detailed above. Some of the smaller collections, for example in the left upper quadrant, demonstrate rim enhancement. An abscess is not excluded. There is no CT evidence for pancreatic necrosis. 2. Moderate-sized left-sided pleural effusion with at least partial collapse of the left lower lobe. 3. Diffuse wall thickening of the transverse colon and splenic flexure favored to be reactive. Other considerations include infectious or inflammatory colitis. 4. Splenomegaly.  The splenic vein remains patent. Electronically Signed   By: Constance Holster M.D.   On: 06/20/2019 21:07   Dg Chest Port 1 View  Result Date: 07/10/2019 CLINICAL DATA:  Leukocytosis EXAM: PORTABLE CHEST 1 VIEW COMPARISON:  None. FINDINGS: Feeding tube tip is in the distal esophagus. There is a small left pleural effusion with patchy consolidation in the left base. There is atelectatic change in each lung base as well. Heart is upper  normal in size with pulmonary vascularity normal. No adenopathy. No bone lesions. IMPRESSION: Feeding tube tip in distal esophagus. Persistent patchy consolidation left base with bibasilar atelectasis. Small left pleural effusion. Stable cardiac silhouette. These results will be called to the ordering clinician or representative by the Radiologist Assistant, and communication documented in the PACS or zVision Dashboard. Electronically Signed   By: Lowella Grip III M.D.   On: 07/10/2019 08:16   Dg Chest Port 1 View  Result Date: 07/05/2019 CLINICAL DATA:  Status post left-sided thoracentesis EXAM: PORTABLE CHEST 1 VIEW COMPARISON:  July 05, 2019 FINDINGS: The left-sided pleural effusion has significantly decreased in size from the prior study. There is no evidence for left-sided pneumothorax. Bibasilar airspace opacities are noted, left worse than right. The lung volumes are low. The heart size is stable from prior study. IMPRESSION: 1. No pneumothorax status post left-sided thoracentesis. 2. Significant interval decrease in size of the left-sided pleural effusion. 3. Bibasilar airspace opacities, left greater than right, favored to represent atelectasis. 4. Low lung volumes. Electronically Signed   By: Constance Holster M.D.   On: 07/05/2019 16:31   Dg Chest Portable 1 View  Result Date: 07/05/2019 CLINICAL DATA:  Chest pain. EXAM: PORTABLE CHEST 1 VIEW COMPARISON:  Radiograph 06/26/2019, CT 06/25/2019 FINDINGS: Re-accumulation of left pleural effusion after thoracentesis. Pleural effusion is moderate in size with associated underlying left lung base atelectasis/consolidation. Possible small right pleural effusion. Unchanged heart size and mediastinal contours. Increasing patchy right lung base opacity. No pneumothorax. IMPRESSION: 1. Re-accumulation of left pleural effusion after thoracentesis, now moderate in size. Associated left lung base atelectasis/consolidation. 2. Possible small right pleural  effusion. Increasing right lung base opacity may be atelectasis or pneumonia. Electronically Signed   By: Keith Rake M.D.   On: 07/05/2019 03:22   Dg Chest Port 1 View  Result Date: 06/24/2019 CLINICAL DATA:  Chest pain for 1 hour EXAM: PORTABLE CHEST 1 VIEW COMPARISON:  06/20/2019 FINDINGS: Left-sided pleural effusion is again seen and slightly enlarged when compare with the prior study. Likely underlying atelectasis/infiltrate is present in the left base. The right lung is clear. No bony abnormality is noted. IMPRESSION: Increasing left-sided pleural effusion. Electronically Signed   By: Inez Catalina M.D.   On: 06/24/2019 19:33   Dg Chest Portable 1 View  Result Date:  06/20/2019 CLINICAL DATA:  Chest pain EXAM: PORTABLE CHEST 1 VIEW COMPARISON:  None. FINDINGS: There is airspace consolidation in the left base with left pleural effusion. Lungs elsewhere are clear. Heart size and pulmonary vascularity are normal. No adenopathy. No bone lesions. IMPRESSION: Left lower lobe airspace consolidation consistent with pneumonia. Small left pleural effusion. Lungs elsewhere clear. No adenopathy. Heart size normal. Followup PA and lateral chest radiographs recommended in 3-4 weeks following trial of antibiotic therapy to ensure resolution and exclude underlying malignancy. Electronically Signed   By: Lowella Grip III M.D.   On: 06/20/2019 19:32   Dg Abd Portable 1v  Result Date: 07/11/2019 CLINICAL DATA:  Per RN-patient called that he moved to use the bathroom and he displaced the core track feeding tube from 70 mark to 35. I advanced it back but he is c/o pain and says he feels its is displaced." EXAM: PORTABLE ABDOMEN - 1 VIEW COMPARISON:  Radiograph yesterday at 10:51 a.m. FINDINGS: Tip of the weighted enteric tube is in the left upper quadrant below the diaphragm, in the pre pyloric stomach. Surgical clips in the right abdomen. IMPRESSION: Tip of the weighted enteric tube below the diaphragm in the  pre pyloric stomach. Electronically Signed   By: Keith Rake M.D.   On: 07/11/2019 00:45   Dg Abd Portable 1v  Result Date: 07/10/2019 CLINICAL DATA:  Check feeding catheter placement EXAM: PORTABLE ABDOMEN - 1 VIEW COMPARISON:  07/09/2019 FINDINGS: Weighted feeding catheter is noted with the tip in the distal stomach. Mildly dilated small bowel is noted similar to that seen on the prior exam. IMPRESSION: Feeding catheter within the distal stomach. Electronically Signed   By: Inez Catalina M.D.   On: 07/10/2019 11:38   Dg Abd Portable 1v  Result Date: 07/09/2019 CLINICAL DATA:  Feeding tube placement EXAM: PORTABLE ABDOMEN - 1 VIEW COMPARISON:  07/05/2019 FINDINGS: Bibasilar pleural effusions and airspace disease. Esophageal tube tip overlies the proximal stomach. Dilated small bowel in the central abdomen. IMPRESSION: Esophageal tube tip overlies the proximal stomach. Electronically Signed   By: Donavan Foil M.D.   On: 07/09/2019 19:32   Ct Renal Stone Study  Result Date: 07/05/2019 CLINICAL DATA:  Chest pain. EXAM: CT ABDOMEN AND PELVIS WITHOUT CONTRAST TECHNIQUE: Multidetector CT imaging of the abdomen and pelvis was performed following the standard protocol without IV contrast. COMPARISON:  Fifteen days ago FINDINGS: Lower chest: At least moderate left pleural effusion with multi segment atelectasis. Pending chest CT. Hepatobiliary: No focal liver abnormality.Cholecystectomy. Pancreas: History of recurrent pancreatitis. Progressive organized collections: 1. 8 x 4 cm along the upper greater curvature of the stomach 2. 6 cm along the mid greater curvature of the stomach-which may be connected to #1. 3. Peripancreatic to splenic tail, 15 x 4 by up to 6 cm-with further extent tracking inferiorly along the descending colon. 4. Ligamentum venosum of the liver measuring 5 x 3 cm 5. Ventral midline abdomen at 5 cm 6. Lower ventral midline abdomen with interloop distortion, 9 cm. These collections exert  mass effect on the stomach, transverse colon, and descending colon. Fat density with rim of stranding posterior to the left kidney in the posterior pararenal space, stable in compatible with fat necrosis. There is generalized retroperitoneal edema about the collections and pancreas. No gas within any of the collections to suggest fistula or definite superinfection. Spleen: Stable size and density. Adrenals/Urinary Tract: Negative adrenals. No hydronephrosis or stone. Unremarkable bladder. Stomach/Bowel: No obstruction. Low-density about the hepatic flexure is  likely a combination of reactive wall thickening and pericolonic fluid. Vascular/Lymphatic: No visible acute vascular abnormality. No noted adenopathy. Reproductive:Negative Other: Right upper quadrant ascites which may be loculated. Musculoskeletal: No acute abnormalities. IMPRESSION: 1. Recent pancreatitis with multiple organized fluid collections in the abdomen as listed and measured above. The largest measures 15 x 4 x 6 cm. Collections exert mass effect on the stomach, transverse colon, descending colon without obstruction. 2. Stable left posterior pararenal fat necrosis. 3. At least moderate left pleural effusion with multi segment atelectasis. Electronically Signed   By: Monte Fantasia M.D.   On: 07/05/2019 07:12   Ir Thoracentesis Asp Pleural Space W/img Guide  Result Date: 06/26/2019 INDICATION: Shortness of breath. Left-sided pleural effusion. Request for diagnostic and therapeutic thoracentesis. EXAM: ULTRASOUND GUIDED LEFT THORACENTESIS MEDICATIONS: None. COMPLICATIONS: None immediate. PROCEDURE: An ultrasound guided thoracentesis was thoroughly discussed with the patient and questions answered. The benefits, risks, alternatives and complications were also discussed. The patient understands and wishes to proceed with the procedure. Written consent was obtained. Ultrasound was performed to localize and mark an adequate pocket of fluid in the left  chest. The area was then prepped and draped in the normal sterile fashion. 1% Lidocaine was used for local anesthesia. Under ultrasound guidance a 6 Fr Safe-T-Centesis catheter was introduced. Thoracentesis was performed. The catheter was removed and a dressing applied. FINDINGS: A total of approximately 1.3 L of hazy yellow fluid was removed. Samples were sent to the laboratory as requested by the clinical team. IMPRESSION: Successful ultrasound guided left thoracentesis yielding 1.3 L of pleural fluid. Read by: Ascencion Dike PA-C Electronically Signed   By: Markus Daft M.D.   On: 06/26/2019 09:19   US Thoracentesis Asp Pleural Space W/img Guide  Result Date: 07/05/2019 INDICATION: Patient with history of recurrent pancreatitis, GERD, dyspnea, recurrent left pleural effusion. Request made for diagnostic and therapeutic left thoracentesis. EXAM: ULTRASOUND GUIDED DIAGNOSTIC AND THERAPEUTIC LEFT THORACENTESIS MEDICATIONS: None COMPLICATIONS: None immediate. PROCEDURE: An ultrasound guided thoracentesis was thoroughly discussed with the patient and questions answered. The benefits, risks, alternatives and complications were also discussed. The patient understands and wishes to proceed with the procedure. Written consent was obtained. Ultrasound was performed to localize and mark an adequate pocket of fluid in the left chest. The area was then prepped and draped in the normal sterile fashion. 1% Lidocaine was used for local anesthesia. Under ultrasound guidance a 6 Fr Safe-T-Centesis catheter was introduced. Thoracentesis was performed. The catheter was removed and a dressing applied. FINDINGS: A total of approximately 770 cc of yellow fluid was removed. Samples were sent to the laboratory as requested by the clinical team. IMPRESSION: Successful ultrasound guided diagnostic and therapeutic left thoracentesis yielding 770 cc of pleural fluid. Read by: Rowe Robert, PA-C Electronically Signed   By: Markus Daft M.D.    On: 07/05/2019 16:12    (Echo, Carotid, EGD, Colonoscopy, ERCP)    Subjective:   Discharge Exam: Vitals:   07/12/19 1147 07/12/19 1148  BP: 127/83   Pulse: 99   Resp: 20 (!) 26  Temp: (!) 97.4 F (36.3 C)   SpO2: 96% 97%   Vitals:   07/12/19 0810 07/12/19 0824 07/12/19 1147 07/12/19 1148  BP:  140/90 127/83   Pulse:  (!) 111 99   Resp: 20 20 20  (!) 26  Temp:  98.2 F (36.8 C) (!) 97.4 F (36.3 C)   TempSrc:  Oral Oral   SpO2: 97% 97% 96% 97%  Weight:  Height:        Physical exam In some distress with pain HEENT: NG +, moist mucosa, supple neck Chest: Diminished breath sounds over left lung base, rt mid back tenderness CVS: Normal S1-S2 GI: Soft, minimal epigastric tenderness, bowel sounds present, nondistended Musculoskeletal: Warm, no edema    The results of significant diagnostics from this hospitalization (including imaging, microbiology, ancillary and laboratory) are listed below for reference.     Microbiology: Recent Results (from the past 240 hour(s))  Urine culture     Status: None   Collection Time: 07/05/19  2:46 AM   Specimen: In/Out Cath Urine  Result Value Ref Range Status   Specimen Description   Final    IN/OUT CATH URINE Performed at [facility], [place] [address], [place], [place] [address]    Special Requests   Final    NONE Performed at [facility], [place] [address] [place], [place] [address]    Culture   Final    NO GROWTH Performed at [facility], [place] [address], [place], [place] [address]    Report Status 07/06/2019 FINAL  Final  Blood Culture (routine x 2)     Status: None   Collection Time: 07/05/19  3:33 AM   Specimen: BLOOD  Result Value Ref Range Status   Specimen Description   Final    BLOOD RIGHT ARM Performed at [place] [address] [place], [place] [address]    Special Requests   Final    BOTTLES DRAWN AEROBIC ONLY Blood Culture results  may not be optimal due to an inadequate volume of blood received in culture bottles Performed at [facility] [address] [place], [place] [address]    Culture   Final    NO GROWTH 5 DAYS Performed at [facility], [place] [address] [place], [place] [address]    Report Status 07/10/2019 FINAL  Final  SARS Coronavirus 2 [facility] order, Performed in [facility] hospital lab) Nasopharyngeal Nasopharyngeal Swab     Status: None   Collection Time: 07/05/19  3:34 AM   Specimen: Nasopharyngeal Swab  Result Value Ref Range Status   SARS Coronavirus 2 NEGATIVE NEGATIVE Final    Comment: (NOTE) If result is NEGATIVE SARS-CoV-2 target nucleic acids are NOT DETECTED. The SARS-CoV-2 RNA is generally detectable in upper and lower  respiratory specimens during the acute phase of infection. The lowest  concentration of SARS-CoV-2 viral copies this assay can detect is 250  copies / mL. A negative result does not preclude SARS-CoV-2 infection  and should not be used as the sole basis for treatment or other  patient management decisions.  A negative result may occur with  improper specimen collection / handling, submission of specimen other  than nasopharyngeal swab, presence of viral mutation(s) within the  areas targeted by this assay, and inadequate number of viral copies  (<250 copies / mL). A negative result must be combined with clinical  observations, patient history, and epidemiological information. If result is POSITIVE SARS-CoV-2 target nucleic acids are DETECTED. The SARS-CoV-2 RNA is generally detectable in upper and lower  respiratory specimens dur ing the acute phase of infection.  Positive  results are indicative of active infection with SARS-CoV-2.  Clinical  correlation with patient history and other diagnostic information is  necessary to determine patient infection status.  Positive results do  not rule out bacterial infection or co-infection with other  viruses. If  result is PRESUMPTIVE POSTIVE SARS-CoV-2 nucleic acids MAY BE PRESENT.   A presumptive positive result was obtained on the submitted specimen  and confirmed on repeat testing.  While 2019 novel coronavirus  (SARS-CoV-2) nucleic acids may be present in the submitted sample  additional confirmatory testing may be necessary for epidemiological  and / or clinical management purposes  to differentiate between  SARS-CoV-2 and other Sarbecovirus currently known to infect humans.  If clinically indicated additional testing with an alternate test  methodology [phone]) is advised. The SARS-CoV-2 RNA is generally  detectable in upper and lower respiratory sp ecimens during the acute  phase of infection. The expected result is Negative. Fact Sheet for Patients:  StrictlyIdeas.no Fact Sheet for Healthcare Providers: BankingDealers.co.za This test is not yet approved or cleared by the [place] FDA and has been authorized for detection and/or diagnosis of SARS-CoV-2 by FDA under an Emergency Use Authorization (EUA).  This EUA will remain in effect (meaning this test can be used) for the duration of the COVID-19 declaration under Section 564(b)(1) of the Act, 21 U.S.C. section 360bbb-3(b)(1), unless the authorization is terminated or revoked sooner. Performed at [facility], [place] [address], [place], [place] [address]   Blood Culture (routine x 2)     Status: None   Collection Time: 07/05/19  7:00 AM   Specimen: BLOOD  Result Value Ref Range Status   Specimen Description   Final    BLOOD LEFT Performed at [place] [address], [place], [place] [address]    Special Requests   Final    BOTTLES DRAWN AEROBIC AND ANAEROBIC Blood Culture adequate volume Performed at [place] [address], [place], [place] [address]    Culture   Final    NO GROWTH 5  DAYS Performed at [facility], [place] [address], [place], [place] [address]    Report Status 07/10/2019 FINAL  Final  Gram stain     Status: None   Collection Time: 07/05/19  4:15 PM   Specimen: Fluid  Result Value Ref Range Status   Specimen Description FLUID  Final   Special Requests NONE  Final   Gram Stain   Final    FEW WBC PRESENT, PREDOMINANTLY MONONUCLEAR NO ORGANISMS SEEN Performed at [facility], [place] [address] [place], [place] [address]    Report Status 07/06/2019 FINAL  Final  Culture, body fluid-bottle     Status: None   Collection Time: 07/05/19  4:15 PM   Specimen: Fluid  Result Value Ref Range Status   Specimen Description FLUID  Final   Special Requests NONE  Final   Culture   Final    NO GROWTH 5 DAYS Performed at [facility] [address] [place], [place] [address]    Report Status 07/10/2019 FINAL  Final  MRSA PCR Screening     Status: None   Collection Time: 07/06/19  3:30 AM   Specimen: Nasal Mucosa; Nasopharyngeal  Result Value Ref Range Status   MRSA by PCR NEGATIVE NEGATIVE Final    Comment:        The GeneXpert MRSA Assay (FDA approved for NASAL specimens only), is one component of a comprehensive MRSA colonization surveillance program. It is not intended to diagnose MRSA infection nor to guide or monitor treatment for MRSA infections. Performed at [facility], [place] [address] [place], [place] [address]   Culture, blood (routine x 2)  Status: None (Preliminary result)   Collection Time: 07/10/19 10:00 AM   Specimen: BLOOD  Result Value Ref Range Status   Specimen Description   Final    BLOOD RIGHT ARM Performed at [place] [address] [place], [place] [address]    Special Requests   Final    BOTTLES DRAWN AEROBIC AND ANAEROBIC Blood Culture adequate volume Performed at [facility] [address], [place], [place] [address]    Culture   Final     NO GROWTH 2 DAYS Performed at [facility] [address] [place], [place] [address]    Report Status PENDING  Incomplete  Culture, blood (routine x 2)     Status: None (Preliminary result)   Collection Time: 07/10/19 10:15 AM   Specimen: BLOOD  Result Value Ref Range Status   Specimen Description   Final    BLOOD RIGHT HAND Performed at [facility] [address], [place], [place] [address]    Special Requests   Final    BOTTLES DRAWN AEROBIC AND ANAEROBIC Blood Culture adequate volume Performed at [facility] [address] [place], [place] [address]    Culture   Final    NO GROWTH 2 DAYS Performed at [place] [address], [place], [place] [address]    Report Status PENDING  Incomplete     Labs: BNP (last 3 results) Recent Labs    07/05/19 0420  BNP [id number]   Basic Metabolic Panel: Recent Labs  Lab 07/08/19 0403 07/09/19 0420 07/09/19 1706 07/10/19 0521 07/10/19 1753 07/11/19 0443 07/12/19 0410 07/12/19 0820  NA 130* 128*  --  131*  --  130*  --  130*  K 4.0 3.7  --  3.9  --  3.8  --  4.3  CL 94* 93*  --  93*  --  93*  --  91*  CO2 27 27  --  29  --  30  --  28  GLUCOSE 106* 94  --  124*  --  114*  --  110*  BUN 12 10  --  10  --  11  --  11  CREATININE 0.51* 0.47*  --  0.49*  --  0.46* 0.43* 0.46*  CALCIUM 8.3* 8.0*  --  8.3*  --  7.9*  --  8.1*  MG 1.8  --  2.0 2.0 2.0  --   --   --   PHOS 3.1  --  3.3 3.3 3.0  --   --   --    Liver Function Tests: Recent Labs  Lab 07/06/19 0212 07/07/19 0221 07/08/19 0403 07/11/19 0443 07/12/19 0820  AST 14* 14* 25 36 44*  ALT 20 16 20  34 41  ALKPHOS 102 107 139* 172* 220*  BILITOT 0.5 0.7 0.8 0.5 0.4  PROT 5.9* 6.1* 5.8* 5.4* 5.9*  ALBUMIN 1.8* 1.8* 1.6* 1.4* 1.6*   Recent Labs  Lab 07/06/19 0212 07/07/19 0221 07/08/19 0403  LIPASE 134* 95* 122*   No results for input(s): AMMONIA in the last 168 hours. CBC: Recent Labs  Lab  07/08/19 0403 07/09/19 0420 07/10/19 0521 07/10/19 1254 07/12/19 0820  WBC 21.9* 20.4* 28.0* 32.2* 32.8*  NEUTROABS  --  16.5*  --  27.1*  --   HGB 10.3* 9.8* 10.8* 10.5* 9.8*  HCT 33.3* 31.6* 34.6* 33.6* 32.0*  MCV 86.7 87.5 86.7 87.7 88.6  PLT 662* 564* 676* 663*  528*   Cardiac Enzymes: No results for input(s): CKTOTAL, CKMB, CKMBINDEX, TROPONINI in the last 168 hours. BNP: Invalid input(s): POCBNP CBG: Recent Labs  Lab 07/11/19 2148 07/11/19 2340 07/12/19 0400 07/12/19 0753 07/12/19 1144  GLUCAP 104* 104* 114* 119* 112*   D-Dimer No results for input(s): DDIMER in the last 72 hours. Hgb A1c No results for input(s): HGBA1C in the last 72 hours. Lipid Profile No results for input(s): CHOL, HDL, LDLCALC, TRIG, CHOLHDL, LDLDIRECT in the last 72 hours. Thyroid function studies No results for input(s): TSH, T4TOTAL, T3FREE, THYROIDAB in the last 72 hours.  Invalid input(s): FREET3 Anemia work up No results for input(s): VITAMINB12, FOLATE, FERRITIN, TIBC, IRON, RETICCTPCT in the last 72 hours. Urinalysis    Component Value Date/Time   COLORURINE AMBER (A) 07/05/2019 0246   APPEARANCEUR CLOUDY (A) 07/05/2019 0246   LABSPEC >1.030 (H) 07/05/2019 0246   PHURINE 5.5 07/05/2019 0246   GLUCOSEU NEGATIVE 07/05/2019 0246   HGBUR NEGATIVE 07/05/2019 0246   BILIRUBINUR SMALL (A) 07/05/2019 0246   KETONESUR NEGATIVE 07/05/2019 0246   PROTEINUR 30 (A) 07/05/2019 0246   NITRITE NEGATIVE 07/05/2019 0246   LEUKOCYTESUR NEGATIVE 07/05/2019 0246   Sepsis Labs Invalid input(s): PROCALCITONIN,  WBC,  LACTICIDVEN Microbiology Recent Results (from the past 240 hour(s))  Urine culture     Status: None   Collection Time: 07/05/19  2:46 AM   Specimen: In/Out Cath Urine  Result Value Ref Range Status   Specimen Description   Final    IN/OUT CATH URINE Performed at [facility], [place] [address], [place], [place] [address]    Special Requests   Final     NONE Performed at [facility], [place] [address], [place], [place] [address]    Culture   Final    NO GROWTH Performed at [facility], [place] [address], [place], [place] [address]    Report Status 07/06/2019 FINAL  Final  Blood Culture (routine x 2)     Status: None   Collection Time: 07/05/19  3:33 AM   Specimen: BLOOD  Result Value Ref Range Status   Specimen Description   Final    BLOOD RIGHT ARM Performed at [place] [address], [place], [place] [address]    Special Requests   Final    BOTTLES DRAWN AEROBIC ONLY Blood Culture results may not be optimal due to an inadequate volume of blood received in culture bottles Performed at [place] [address], [place], [place] [address]    Culture   Final    NO GROWTH 5 DAYS Performed at [facility], [place] [address] [place], [place] [address]    Report Status 07/10/2019 FINAL  Final  SARS Coronavirus 2 [facility] order, Performed in [facility] hospital lab) Nasopharyngeal Nasopharyngeal Swab     Status: None   Collection Time: 07/05/19  3:34 AM   Specimen: Nasopharyngeal Swab  Result Value Ref Range Status   SARS Coronavirus 2 NEGATIVE NEGATIVE Final    Comment: (NOTE) If result is NEGATIVE SARS-CoV-2 target nucleic acids are NOT DETECTED. The SARS-CoV-2 RNA is generally detectable in upper and lower  respiratory specimens during the acute phase of infection. The lowest  concentration of SARS-CoV-2 viral copies this assay can detect is 250  copies / mL. A negative result does not preclude SARS-CoV-2 infection  and should not be used as the sole basis for treatment or other  patient management  decisions.  A negative result may occur with  improper specimen collection / handling, submission of specimen other  than nasopharyngeal swab, presence of viral mutation(s) within the  areas targeted by this assay, and inadequate number of viral copies  (<250  copies / mL). A negative result must be combined with clinical  observations, patient history, and epidemiological information. If result is POSITIVE SARS-CoV-2 target nucleic acids are DETECTED. The SARS-CoV-2 RNA is generally detectable in upper and lower  respiratory specimens dur ing the acute phase of infection.  Positive  results are indicative of active infection with SARS-CoV-2.  Clinical  correlation with patient history and other diagnostic information is  necessary to determine patient infection status.  Positive results do  not rule out bacterial infection or co-infection with other viruses. If result is PRESUMPTIVE POSTIVE SARS-CoV-2 nucleic acids MAY BE PRESENT.   A presumptive positive result was obtained on the submitted specimen  and confirmed on repeat testing.  While 2019 novel coronavirus  (SARS-CoV-2) nucleic acids may be present in the submitted sample  additional confirmatory testing may be necessary for epidemiological  and / or clinical management purposes  to differentiate between  SARS-CoV-2 and other Sarbecovirus currently known to infect humans.  If clinically indicated additional testing with an alternate test  methodology [id number]) is advised. The SARS-CoV-2 RNA is generally  detectable in upper and lower respiratory sp ecimens during the acute  phase of infection. The expected result is Negative. Fact Sheet for Patients:  StrictlyIdeas.no Fact Sheet for Healthcare Providers: BankingDealers.co.za This test is not yet approved or cleared by the [place] FDA and has been authorized for detection and/or diagnosis of SARS-CoV-2 by FDA under an Emergency Use Authorization (EUA).  This EUA will remain in effect (meaning this test can be used) for the duration of the COVID-19 declaration under Section 564(b)(1) of the Act, 21 U.S.C. section 360bbb-3(b)(1), unless the authorization is terminated or revoked  sooner. Performed at [facility], [place] [address] [place], [place] [address]   Blood Culture (routine x 2)     Status: None   Collection Time: 07/05/19  7:00 AM   Specimen: BLOOD  Result Value Ref Range Status   Specimen Description   Final    BLOOD LEFT Performed at [place] [address] [place], [place] [address]    Special Requests   Final    BOTTLES DRAWN AEROBIC AND ANAEROBIC Blood Culture adequate volume Performed at [place] [address] [place], [place] [address]    Culture   Final    NO GROWTH 5 DAYS Performed at [facility], [place] [address] [place], [place] [address]    Report Status 07/10/2019 FINAL  Final  Gram stain     Status: None   Collection Time: 07/05/19  4:15 PM   Specimen: Fluid  Result Value Ref Range Status   Specimen Description FLUID  Final   Special Requests NONE  Final   Gram Stain   Final    FEW WBC PRESENT, PREDOMINANTLY MONONUCLEAR NO ORGANISMS SEEN Performed at [facility], [place] [address], [place], [place] [address]    Report Status 07/06/2019 FINAL  Final  Culture, body fluid-bottle     Status: None   Collection Time: 07/05/19  4:15 PM   Specimen: Fluid  Result Value Ref Range Status   Specimen Description FLUID  Final   Special Requests NONE  Final   Culture  Final    NO GROWTH 5 DAYS Performed at [facility], [place] [address], [place], [place] [address]    Report Status 07/10/2019 FINAL  Final  MRSA PCR Screening     Status: None   Collection Time: 07/06/19  3:30 AM   Specimen: Nasal Mucosa; Nasopharyngeal  Result Value Ref Range Status   MRSA by PCR NEGATIVE NEGATIVE Final    Comment:        The GeneXpert MRSA Assay (FDA approved for NASAL specimens only), is one component of a comprehensive MRSA colonization surveillance program. It is not intended to diagnose MRSA infection nor to guide or monitor treatment for MRSA  infections. Performed at [facility], [place] [address] [place], [place] [address]   Culture, blood (routine x 2)     Status: None (Preliminary result)   Collection Time: 07/10/19 10:00 AM   Specimen: BLOOD  Result Value Ref Range Status   Specimen Description   Final    BLOOD RIGHT ARM Performed at [place] [address], [place], [place] [address]    Special Requests   Final    BOTTLES DRAWN AEROBIC AND ANAEROBIC Blood Culture adequate volume Performed at [place] [address] [place], [place] [address]    Culture   Final    NO GROWTH 2 DAYS Performed at [facility] [address], [place], [place] [address]    Report Status PENDING  Incomplete  Culture, blood (routine x 2)     Status: None (Preliminary result)   Collection Time: 07/10/19 10:15 AM   Specimen: BLOOD  Result Value Ref Range Status   Specimen Description   Final    BLOOD RIGHT HAND Performed at [place] [address] [place], [place] [address]    Special Requests   Final    BOTTLES DRAWN AEROBIC AND ANAEROBIC Blood Culture adequate volume Performed at [place] [address] [place], [place] [address]    Culture   Final    NO GROWTH 2 DAYS Performed at [place] [address], [place], [place] [address]    Report Status PENDING  Incomplete     Time coordinating discharge: 35 minutes  SIGNED:   Louellen Molder, MD  [facility] 07/12/2019, 1:12 PM Pager   If 7PM-7AM, please contact night-coverage [email or web address] Password [email or web address]

## 2019-07-12 NOTE — Progress Notes (Signed)
RN spoke with [facility] about transfer. Currently do not have any beds avaliable but should have bed availability late Sunday or Monday for transfer. Will be in contact.

## 2019-07-13 DIAGNOSIS — E871 Hypo-osmolality and hyponatremia: Secondary | ICD-10-CM

## 2019-07-13 LAB — CBC
HCT: 31.6 % — ABNORMAL LOW (ref 39.0–52.0)
Hemoglobin: 9.7 g/dL — ABNORMAL LOW (ref 13.0–17.0)
MCH: 27.1 pg (ref 26.0–34.0)
MCHC: 30.7 g/dL (ref 30.0–36.0)
MCV: 88.3 fL (ref 80.0–100.0)
Platelets: 526 10*3/uL — ABNORMAL HIGH (ref 150–400)
RBC: 3.58 MIL/uL — ABNORMAL LOW (ref 4.22–5.81)
RDW: 14.4 % (ref 11.5–15.5)
WBC: 27.9 10*3/uL — ABNORMAL HIGH (ref 4.0–10.5)
nRBC: 0 % (ref 0.0–0.2)

## 2019-07-13 LAB — COMPREHENSIVE METABOLIC PANEL
ALT: 46 U/L — ABNORMAL HIGH (ref 0–44)
AST: 51 U/L — ABNORMAL HIGH (ref 15–41)
Albumin: 1.6 g/dL — ABNORMAL LOW (ref 3.5–5.0)
Alkaline Phosphatase: 250 U/L — ABNORMAL HIGH (ref 38–126)
Anion gap: 8 (ref 5–15)
BUN: 13 mg/dL (ref 6–20)
CO2: 27 mmol/L (ref 22–32)
Calcium: 7.9 mg/dL — ABNORMAL LOW (ref 8.9–10.3)
Chloride: 91 mmol/L — ABNORMAL LOW (ref 98–111)
Creatinine, Ser: 0.38 mg/dL — ABNORMAL LOW (ref 0.61–1.24)
GFR calc Af Amer: >60 mL/min (ref >60)
GFR calc non Af Amer: >60 mL/min (ref >60)
Glucose, Bld: 124 mg/dL — ABNORMAL HIGH (ref 70–99)
Potassium: 4.7 mmol/L (ref 3.5–5.1)
Sodium: 126 mmol/L — ABNORMAL LOW (ref 135–145)
Total Bilirubin: 0.6 mg/dL (ref 0.3–1.2)
Total Protein: 5.6 g/dL — ABNORMAL LOW (ref 6.5–8.1)

## 2019-07-13 LAB — GLUCOSE, CAPILLARY
Glucose-Capillary: 108 mg/dL — ABNORMAL HIGH (ref 70–99)
Glucose-Capillary: 109 mg/dL — ABNORMAL HIGH (ref 70–99)
Glucose-Capillary: 130 mg/dL — ABNORMAL HIGH (ref 70–99)
Glucose-Capillary: 93 mg/dL (ref 70–99)
Glucose-Capillary: 96 mg/dL (ref 70–99)

## 2019-07-13 LAB — PREALBUMIN: Prealbumin: <5 mg/dL — ABNORMAL LOW (ref 18–38)

## 2019-07-13 MED ORDER — KETOROLAC TROMETHAMINE 15 MG/ML IJ SOLN
15.0000 mg | Freq: Once | INTRAMUSCULAR | Status: AC
  Administered 2019-07-13: 15 mg via INTRAVENOUS
  Filled 2019-07-13: qty 1

## 2019-07-13 MED ORDER — SODIUM CHLORIDE 0.9 % IV SOLN
INTRAVENOUS | Status: AC
  Administered 2019-07-13 – 2019-07-14 (×2): via INTRAVENOUS

## 2019-07-13 MED ORDER — ALPRAZOLAM 0.5 MG PO TABS
0.5000 mg | ORAL_TABLET | Freq: Once | ORAL | Status: AC
  Administered 2019-07-13: 0.5 mg via ORAL
  Filled 2019-07-13: qty 1

## 2019-07-13 MED ORDER — PROSIGHT PO TABS
1.0000 | ORAL_TABLET | Freq: Every day | ORAL | Status: DC
  Administered 2019-07-13 – 2019-07-14 (×2): 1 via ORAL
  Filled 2019-07-13 (×2): qty 1

## 2019-07-13 MED FILL — Sodium Chloride IV Soln 0.9%: INTRAVENOUS | Qty: 100 | Status: AC

## 2019-07-13 MED FILL — Morphine Sulf For Microinfusion PF Inj 200 MG/20ML (10MG/ML): INTRAMUSCULAR | Qty: 20 | Status: AC

## 2019-07-13 NOTE — Progress Notes (Addendum)
UNASSIGNED PATIENT Subjective: Since I last evaluated the patient, his condition remains unchanged he is anxiously awaiting [facility].  He denies having any abdominal pain, nausea or vomiting.  Objective: Vital signs in last 24 hours: Temp:  [98.1 F (36.7 C)-100.1 F (37.8 C)] 99.9 F (37.7 C) (08/24 1203) Pulse Rate:  [83-113] 107 (08/24 1203) Resp:  [17-33] 19 (08/24 1203) BP: (97-125)/(56-79) 100/69 (08/24 1203) SpO2:  [92 %-100 %] 98 % (08/24 1203) Weight:  [79.8 kg] 79.8 kg (08/24 0638) Last BM Date: 07/12/19  Intake/Output from previous day: 08/23 0701 - 08/24 0700 In: 1729.9 [P.O.:210; I.V.:105.7; NG/GT:928.8; IV Piggyback:485.4] Out: 300 [Urine:300] Intake/Output this shift: Total I/O In: -  Out: 250 [Urine:250]  General appearance: cooperative, appears stated age, fatigued, no distress and pale; feeding tube in place Resp: clear to auscultation bilaterally Cardio: regular rate and rhythm, S1, S2 normal, no murmur, click, rub or gallop GI: soft, non-tender; bowel sounds normal; no masses,  no organomegaly Extremities: extremities normal, atraumatic, no cyanosis or edema  Lab Results: Recent Labs    07/10/19 1254 07/12/19 0820 07/13/19 0839  WBC 32.2* 32.8* 27.9*  HGB 10.5* 9.8* 9.7*  HCT 33.6* 32.0* 31.6*  PLT 663* 528* 526*   BMET Recent Labs    07/11/19 0443 07/12/19 0410 07/12/19 0820 07/13/19 0839  NA 130*  --  130* 126*  K 3.8  --  4.3 4.7  CL 93*  --  91* 91*  CO2 30  --  28 27  GLUCOSE 114*  --  110* 124*  BUN 11  --  11 13  CREATININE 0.46* 0.43* 0.46* 0.38*  CALCIUM 7.9*  --  8.1* 7.9*   LFT Recent Labs    07/13/19 0839  PROT 5.6*  ALBUMIN 1.6*  AST 51*  ALT 46*  ALKPHOS 250*  BILITOT 0.6   Studies/Results: Dg Chest Port 1 View  Result Date: 07/12/2019 CLINICAL DATA:  Pt reported right sided chest wall pain. Medical hx of enlarging pancreatic pseudocysts and one that is larger and newer in upper  abdomen exerting mass effect on anterior aspect of left lobe of liver. EXAM: PORTABLE CHEST 1 VIEW COMPARISON:  07/10/2019 FINDINGS: Shallow lung inflation. Bilateral pleural effusions and bibasilar opacities appear stable. Feeding tube has been withdrawn, tip now within the level of the mid esophagus. IMPRESSION: Feeding tube tip within the mid esophagus. These results will be called to the ordering clinician or representative by the Radiologist Assistant, and communication documented in the PACS or zVision Dashboard. Electronically Signed   By: Nolon Nations M.D.   On: 07/12/2019 14:07   Medications: I have reviewed the patient's current medications.  Assessment/Plan: ) Acute acute recurrent pancreatitis with pseudocysts and and multiple fluid collections in the abdomen-on tube feeds.  We will continue supportive care and await transfer to [facility] soon as possible. 2) Severe protein calorie malnutrition malnutrition. 3) History of alcohol and tobacco abuse.   LOS: 8 days   Juanita Craver 07/13/2019, 12:47 PM

## 2019-07-13 NOTE — Progress Notes (Signed)
   07/13/19 0906  MEWS Score  Resp (!) 27  SpO2 95 %  O2 Device Nasal Cannula  O2 Flow Rate (L/min) 2 L/min  MEWS Score  MEWS RR 2  MEWS Pulse 2  MEWS Systolic 0  MEWS LOC 0  MEWS Temp 0  MEWS Score 4  MEWS Score Color Red  Dr. Clementeen Graham notified via text page of patient's VS change and MEWS yellow to red.  Patient alert and oriented.  Reports "feeling anxious and wants out of bed."  Patient repositioned to chair.  States "this is better.  Dr. Clementeen Graham to see patient.

## 2019-07-13 NOTE — Progress Notes (Signed)
PT Cancellation Note  Patient Details Name: Kyle Mooney MRN: [id number] DOB: [birth year]   Cancelled Treatment:    Reason Eval/Treat Not Completed: Patient declined, no reason specified. Pt requested PT to come back later today.  Will check back as schedule permits.   Galen Manila 07/13/2019, 2:42 PM

## 2019-07-13 NOTE — Progress Notes (Signed)
PROGRESS NOTE                                                                                                                                                                                                             Patient Demographics:    Kyle Mooney, is a 48 y.o. male, DOB - 06-14-1971, XBJ:[id number]  Admit date - 07/05/2019   Admitting Physician A Grier Mitts., MD  Outpatient Primary MD for the patient is Patient, No Pcp Per  LOS - 8    Chief Complaint  Patient presents with   Chest Pain   Abdominal Pain       Brief Narrative   48 year old male with recurrent pancreatitis with developing pseudocyst, IBS and GERD who was followed at [facility] for his idiopathic pancreatitis and pancreatic cyst presented with recurrent epigastric pain suspicious for acute on chronic pancreatitis. He was last hospitalized at [facility] on 06/02/2019 with necrotizing pancreatitis. CT angiogram of the chest was negative for PE but showed large layering left pleural effusion with near complete lower lobe collapse. IR consulted and underwent thoracentesis of the left pleural effusion. Surgery consulted for evaluation of pseudocyst and recommended to wait for 6 weeks for the pseudocyst to mature and either follow-up with Dr. gross or at [facility] for further evaluation. Given his persistent pain he has been requiring IV morphine PCA. Hospital course prolonged due to persistent abdominal pain requiring morphine PCA and development of new likely infected pseudocyst.   Subjective:   Patient anxious and says he was claustrophobic early this morning.  Was noted to be tachycardic in low 110s and soft blood pressure.  Afebrile.  Reports having some throat discomfort with the core track.  Right-sided back pain better from yesterday. [facility] again this morning and still waiting for a bed.  Hopefully may get a bed today.   Assessment  & Plan :  Principal Problem: Idiopathic acute pancreatitis with infected pseudocyst Continue morphine PCA, empiric IV meropenem. GI and surgery following closely.  On enteral nutrition through core track. Per surgery the pseudocyst would ideally need to mature for at least 6 weeks to attempt cystoscopy gastrostomy.  Developed new right-sided mid back pain possibly due to new pseudocyst that may be infected given his worsened WBC. Repeated CT of the abdomen on 8/21 new fluid collection measuring 5.8  x 6 cm which is concerning to be infected. IR evaluated patient for percutaneous drainage but patient refused.  I also recommend to avoid the drainage unless patient has significant decompensation.  Patient has been accepted at [facility] and is awaiting bed for past several days.     Active Problems:   Hyponatremia Worsening sodium today.  Had improved with IV fluids previously.  Will resume fluids and monitor.  Severe protein calorie malnutrition (HCC) Enteral feeding through core track.  Tobacco abuse Counseled on cessation  Alcohol abuse No signs of withdrawal.    Leukocytosis and thrombocytosis Secondary to infected cyst.  Exudative pleural effusion, left  thoracentesis done on 8/17 with improvement noted on follow up CT from 8/21. On empiric meropenem.     Family Communication :None  Disposition Plan:[facility] once bed available    Consults :GI/surgery  Procedures : CT abdomen pelvis, left thoracentesis  Lab Results  Component Value Date   PLT 526 (H) 07/13/2019    Antibiotics  :  Anti-infectives (From admission, onward)   Start     Dose/Rate Route Frequency Ordered Stop   07/12/19 0000  meropenem 1 g in sodium chloride 0.9 % 100 mL     1 g Intravenous Every 8 hours 07/12/19 1311     07/05/19 1800  ceFEPIme (MAXIPIME) 2 g in sodium chloride 0.9 % 100 mL IVPB  Status:  Discontinued     2 g 200 mL/hr  over 30 Minutes Intravenous Every 8 hours 07/05/19 0852 07/05/19 1013   07/05/19 1400  meropenem (MERREM) 1 g in sodium chloride 0.9 % 100 mL IVPB     1 g 200 mL/hr over 30 Minutes Intravenous Every 8 hours 07/05/19 1013 07/15/19 1359   07/05/19 1000  metroNIDAZOLE (FLAGYL) IVPB 500 mg  Status:  Discontinued     500 mg 100 mL/hr over 60 Minutes Intravenous Every 8 hours 07/05/19 0833 07/05/19 1013   07/05/19 0345  vancomycin (VANCOCIN) IVPB 1000 mg/200 mL premix  Status:  Discontinued     1,000 mg 200 mL/hr over 60 Minutes Intravenous  Once 07/05/19 0333 07/05/19 0336   07/05/19 0345  ceFEPIme (MAXIPIME) 2 g in sodium chloride 0.9 % 100 mL IVPB     2 g 200 mL/hr over 30 Minutes Intravenous  Once 07/05/19 0333 07/05/19 1001   07/05/19 0345  vancomycin (VANCOCIN) 1,500 mg in sodium chloride 0.9 % 500 mL IVPB     1,500 mg 250 mL/hr over 120 Minutes Intravenous  Once 07/05/19 0336 07/05/19 1001        Objective:   Vitals:   07/13/19 1037 07/13/19 1108 07/13/19 1137 07/13/19 1203  BP: 113/70 (!) 109/57  100/69  Pulse: (!) 101 (!) 104 (!) 106 (!) 107  Resp: (!) 22 (!) 22 17 19   Temp: 99.4 F (37.4 C) 100.1 F (37.8 C)  99.9 F (37.7 C)  TempSrc: Oral Oral  Oral  SpO2: 98% 98%  98%  Weight:      Height:        Wt Readings from Last 3 Encounters:  07/13/19 79.8 kg  06/21/19 75 kg     Intake/Output Summary (Last 24 hours) at 07/13/2019 1223 Last data filed at 07/13/2019 1116 Gross per 24 hour  Intake 1729.89 ml  Output 450 ml  Net 1279.89 ml    Physical exam Fatigued, anxious HEENT: Moist mucosa, supple neck, NG+ Chest: Improved left-sided breath sounds, no tenderness over right mid back CVS: S1-S2 tachycardic, no murmurs  GI: Soft, nondistended, mild epigastric tenderness, bowel sounds present Musculoskeletal: Warm, no edema     Data Review:    CBC Recent Labs  Lab 07/09/19 0420 07/10/19 0521 07/10/19 1254 07/12/19 0820 07/13/19 0839  WBC 20.4* 28.0* 32.2*  32.8* 27.9*  HGB 9.8* 10.8* 10.5* 9.8* 9.7*  HCT 31.6* 34.6* 33.6* 32.0* 31.6*  PLT 564* 676* 663* 528* 526*  MCV 87.5 86.7 87.7 88.6 88.3  MCH 27.1 27.1 27.4 27.1 27.1  MCHC 31.0 31.2 31.3 30.6 30.7  RDW 13.8 14.1 14.2 14.1 14.4  LYMPHSABS 2.1  --  2.5  --   --   MONOABS 1.5*  --  2.3*  --   --   EOSABS 0.1  --  0.1  --   --   BASOSABS 0.0  --  0.1  --   --     Chemistries  Recent Labs  Lab 07/07/19 0221 07/08/19 0403 07/09/19 0420 07/09/19 1706 07/10/19 0521 07/10/19 1753 07/11/19 0443 07/12/19 0410 07/12/19 0820 07/13/19 0839  NA 132* 130* 128*  --  131*  --  130*  --  130* 126*  K 4.1 4.0 3.7  --  3.9  --  3.8  --  4.3 4.7  CL 96* 94* 93*  --  93*  --  93*  --  91* 91*  CO2 27 27 27   --  29  --  30  --  28 27  GLUCOSE 108* 106* 94  --  124*  --  114*  --  110* 124*  BUN 12 12 10   --  10  --  11  --  11 13  CREATININE 0.63 0.51* 0.47*  --  0.49*  --  0.46* 0.43* 0.46* 0.38*  CALCIUM 8.6* 8.3* 8.0*  --  8.3*  --  7.9*  --  8.1* 7.9*  MG  --  1.8  --  2.0 2.0 2.0  --   --   --   --   AST 14* 25  --   --   --   --  36  --  44* 51*  ALT 16 20  --   --   --   --  34  --  41 46*  ALKPHOS 107 139*  --   --   --   --  172*  --  220* 250*  BILITOT 0.7 0.8  --   --   --   --  0.5  --  0.4 0.6   ------------------------------------------------------------------------------------------------------------------ No results for input(s): CHOL, HDL, LDLCALC, TRIG, CHOLHDL, LDLDIRECT in the last 72 hours.  No results found for: HGBA1C ------------------------------------------------------------------------------------------------------------------ No results for input(s): TSH, T4TOTAL, T3FREE, THYROIDAB in the last 72 hours.  Invalid input(s): FREET3 ------------------------------------------------------------------------------------------------------------------ No results for input(s): VITAMINB12, FOLATE, FERRITIN, TIBC, IRON, RETICCTPCT in the last 72 hours.  Coagulation  profile No results for input(s): INR, PROTIME in the last 168 hours.  No results for input(s): DDIMER in the last 72 hours.  Cardiac Enzymes No results for input(s): CKMB, TROPONINI, MYOGLOBIN in the last 168 hours.  Invalid input(s): CK ------------------------------------------------------------------------------------------------------------------    Component Value Date/Time   BNP 98.7 07/05/2019 0420    Inpatient Medications  Scheduled Meds:  Chlorhexidine Gluconate Cloth  6 each Topical Q0600   enoxaparin (LOVENOX) injection  40 mg Subcutaneous Q24H   feeding supplement (PRO-STAT SUGAR FREE 64)  30 mL Per Tube BID   folic acid  1 mg Oral Daily   free water  100 mL Per Tube Q4H   guaiFENesin  600 mg Oral BID   lidocaine  1 patch Transdermal Q24H   lip balm  1 application Topical BID   metoprolol tartrate  25 mg Oral BID   morphine   Intravenous Q4H   multivitamin  15 mL Per Tube Daily   sodium chloride flush  10-40 mL Intracatheter Q12H   thiamine  100 mg Oral Daily   Or   thiamine  100 mg Intravenous Daily   Continuous Infusions:  sodium chloride 10 mL/hr at 07/12/19 1734   famotidine (PEPCID) IV 20 mg (07/13/19 0910)   feeding supplement (OSMOLITE 1.5 CAL) 60 mL/hr at 07/12/19 1734   meropenem (MERREM) IV 1 g (07/13/19 0637)   PRN Meds:.sodium chloride, acetaminophen **OR** acetaminophen, diphenhydrAMINE **OR** diphenhydrAMINE, magic mouthwash, menthol-cetylpyridinium, naloxone **AND** sodium chloride flush, ondansetron (ZOFRAN) IV, phenol, polyethylene glycol, sodium chloride flush  Micro Results Recent Results (from the past 240 hour(s))  Urine culture     Status: None   Collection Time: 07/05/19  2:46 AM   Specimen: In/Out Cath Urine  Result Value Ref Range Status   Specimen Description   Final    IN/OUT CATH URINE Performed at [facility], [address] [place], [place] [address]    Special Requests   Final     NONE Performed at [facility], [address] [place], [place] [address]    Culture   Final    NO GROWTH Performed at [facility], [address], [place], [place] [address]    Report Status 07/06/2019 FINAL  Final  Blood Culture (routine x 2)     Status: None   Collection Time: 07/05/19  3:33 AM   Specimen: BLOOD  Result Value Ref Range Status   Specimen Description   Final    BLOOD RIGHT ARM Performed at [facility], [address] [place] [address], [place], [place] [address]    Special Requests   Final    BOTTLES DRAWN AEROBIC ONLY Blood Culture results may not be optimal due to an inadequate volume of blood received in culture bottles Performed at [facility], [address], [place], [place] [address]    Culture   Final    NO GROWTH 5 DAYS Performed at [facility], [address] [place], [place] [address]    Report Status 07/10/2019 FINAL  Final  SARS Coronavirus 2 [facility] order, Performed in [facility] hospital lab) Nasopharyngeal Nasopharyngeal Swab     Status: None   Collection Time: 07/05/19  3:34 AM   Specimen: Nasopharyngeal Swab  Result Value Ref Range Status   SARS Coronavirus 2 NEGATIVE NEGATIVE Final    Comment: (NOTE) If result is NEGATIVE SARS-CoV-2 target nucleic acids are NOT DETECTED. The SARS-CoV-2 RNA is generally detectable in upper and lower  respiratory specimens during the acute phase of infection. The lowest  concentration of SARS-CoV-2 viral copies this assay can detect is 250  copies / mL. A negative result does not preclude SARS-CoV-2 infection  and should not be used as the sole basis for treatment or other  patient management decisions.  A negative result may occur with  improper specimen collection / handling, submission of specimen other  than nasopharyngeal swab, presence of viral mutation(s) within the  areas targeted by this assay, and inadequate number of viral copies  (<250  copies / mL). A negative result must be combined with clinical  observations, patient  history, and epidemiological information. If result is POSITIVE SARS-CoV-2 target nucleic acids are DETECTED. The SARS-CoV-2 RNA is generally detectable in upper and lower  respiratory specimens dur ing the acute phase of infection.  Positive  results are indicative of active infection with SARS-CoV-2.  Clinical  correlation with patient history and other diagnostic information is  necessary to determine patient infection status.  Positive results do  not rule out bacterial infection or co-infection with other viruses. If result is PRESUMPTIVE POSTIVE SARS-CoV-2 nucleic acids MAY BE PRESENT.   A presumptive positive result was obtained on the submitted specimen  and confirmed on repeat testing.  While 2019 novel coronavirus  (SARS-CoV-2) nucleic acids may be present in the submitted sample  additional confirmatory testing may be necessary for epidemiological  and / or clinical management purposes  to differentiate between  SARS-CoV-2 and other Sarbecovirus currently known to infect humans.  If clinically indicated additional testing with an alternate test  methodology [phone]) is advised. The SARS-CoV-2 RNA is generally  detectable in upper and lower respiratory sp ecimens during the acute  phase of infection. The expected result is Negative. Fact Sheet for Patients:  BoilerBrush.com.cy Fact Sheet for Healthcare Providers: https://pope.com/ This test is not yet approved or cleared by the [place] FDA and has been authorized for detection and/or diagnosis of SARS-CoV-2 by FDA under an Emergency Use Authorization (EUA).  This EUA will remain in effect (meaning this test can be used) for the duration of the COVID-19 declaration under Section 564(b)(1) of the Act, 21 U.S.C. section 360bbb-3(b)(1), unless the authorization is terminated or revoked  sooner. Performed at [facility], [address], [place], [place] [address]   Blood Culture (routine x 2)     Status: None   Collection Time: 07/05/19  7:00 AM   Specimen: BLOOD  Result Value Ref Range Status   Specimen Description   Final    BLOOD LEFT Performed at [facility], [address] [place], [place] [address]    Special Requests   Final    BOTTLES DRAWN AEROBIC AND ANAEROBIC Blood Culture adequate volume Performed at [facility], [address] [place], [place] [address]    Culture   Final    NO GROWTH 5 DAYS Performed at [facility], [address], [place], [place] [address]    Report Status 07/10/2019 FINAL  Final  Gram stain     Status: None   Collection Time: 07/05/19  4:15 PM   Specimen: Fluid  Result Value Ref Range Status   Specimen Description FLUID  Final   Special Requests NONE  Final   Gram Stain   Final    FEW WBC PRESENT, PREDOMINANTLY MONONUCLEAR NO ORGANISMS SEEN Performed at [facility], [address], [place], [place] [address]    Report Status 07/06/2019 FINAL  Final  Culture, body fluid-bottle     Status: None   Collection Time: 07/05/19  4:15 PM   Specimen: Fluid  Result Value Ref Range Status   Specimen Description FLUID  Final   Special Requests NONE  Final   Culture   Final    NO GROWTH 5 DAYS Performed at [facility], [address], [place], [place] [address]    Report Status 07/10/2019 FINAL  Final  MRSA PCR Screening     Status: None   Collection Time: 07/06/19  3:30 AM   Specimen: Nasal Mucosa; Nasopharyngeal  Result Value  Ref Range Status   MRSA by PCR NEGATIVE NEGATIVE Final    Comment:        The GeneXpert MRSA Assay (FDA approved for NASAL specimens only), is one component of a comprehensive MRSA colonization surveillance program. It is not intended to diagnose MRSA infection nor to guide or monitor treatment for MRSA  infections. Performed at [facility], [address] [place], [place] [address]   Culture, blood (routine x 2)     Status: None (Preliminary result)   Collection Time: 07/10/19 10:00 AM   Specimen: BLOOD  Result Value Ref Range Status   Specimen Description   Final    BLOOD RIGHT ARM Performed at [facility], [address], [place], [place] [address]    Special Requests   Final    BOTTLES DRAWN AEROBIC AND ANAEROBIC Blood Culture adequate volume Performed at [facility], [address], [place], [place] [address]    Culture   Final    NO GROWTH 3 DAYS Performed at [facility], [address], [place], [place] [address]    Report Status PENDING  Incomplete  Culture, blood (routine x 2)     Status: None (Preliminary result)   Collection Time: 07/10/19 10:15 AM   Specimen: BLOOD  Result Value Ref Range Status   Specimen Description   Final    BLOOD RIGHT HAND Performed at [facility], [address] [place], [place] [address]    Special Requests   Final    BOTTLES DRAWN AEROBIC AND ANAEROBIC Blood Culture adequate volume Performed at [facility], [address], [place], [place] [address]    Culture   Final    NO GROWTH 3 DAYS Performed at [facility], [address], [place], [place] [address]    Report Status PENDING  Incomplete    Radiology Reports Dg Chest 1 View  Result Date: 06/26/2019 CLINICAL DATA:  Status post thoracentesis. EXAM: CHEST  1 VIEW COMPARISON:  06/24/2019 FINDINGS: Interval near complete evacuation of the left pleural fluid collection. A small residual effusion remains. No evidence of a postprocedural pneumothorax. Left basilar atelectasis is noted. The right lung is clear. No right-sided effusion. IMPRESSION: Interval near complete evacuation of the left pleural fluid collection with left lower lobe atelectasis. No postprocedural  pneumothorax. Electronically Signed   By: Rudie Meyer M.D.   On: 06/26/2019 09:40   Dg Abd 1 View  Result Date: 06/21/2019 CLINICAL DATA:  Pt reports extreme abdominal pain across entire abdomen. Reports hx of pancreatitis. Pain with any movements. EXAM: ABDOMEN - 1 VIEW COMPARISON:  None. FINDINGS: The bowel gas pattern is normal. Cholecystectomy clips. No radio-opaque calculi or other significant radiographic abnormality are seen. IMPRESSION: Negative. Electronically Signed   By: Corlis Leak M.D.   On: 06/21/2019 11:04   Ct Chest W Contrast  Result Date: 07/10/2019 CLINICAL DATA:  Pleural effusion, abdominal pain, pancreatitis, increased WBC EXAM: CT CHEST, ABDOMEN, AND PELVIS WITH CONTRAST TECHNIQUE: Multidetector CT imaging of the chest, abdomen and pelvis was performed following the standard protocol during bolus administration of intravenous contrast. 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 Normal heart size. No pericardial effusion. Mediastinum/Nodes: No enlarged mediastinal, hilar, or axillary  lymph nodes. Thyroid gland, trachea, and esophagus demonstrate no significant findings. Lungs/Pleura: Moderate left, small right pleural effusion with associated atelectasis or consolidation. Left pleural effusion is slightly decreased compared to prior examination. Musculoskeletal: No chest wall mass or suspicious bone lesions identified. CT ABDOMEN PELVIS FINDINGS Hepatobiliary: No solid liver abnormality is seen. Mildly coarse, nodular contour of the liver. Status post cholecystectomy. Pancreas: Unremarkable. No pancreatic ductal dilatation or surrounding inflammatory changes. Spleen: Normal in size without significant  abnormality. Adrenals/Urinary Tract: Adrenal glands are unremarkable. Kidneys are normal, without renal calculi, solid lesion, or hydronephrosis. There is redemonstrated soft tissue thickening in the posterior perirenal fat, likely related to inflammatory fat necrosis and unchanged from prior. Bladder is unremarkable. Stomach/Bowel: Stomach is within normal limits. Weighted enteric feeding tube is positioned with tip in the stomach. Appendix appears normal. There are multiple loops of contrast filled although not overtly distended proximal to mid small bowel. There is gas and fluid in the colon to the rectum. Vascular/Lymphatic: Aortic atherosclerosis. Prominent mesenteric lymph nodes and/or fluid in the central small bowel mesentery unchanged from prior (series 2, image 74). Reproductive: No mass or other abnormality. Other: No abdominal wall hernia or abnormality. Redemonstrated small volume ascites. The peritoneum generally appears slightly thickened and enhancing. There are multiple redemonstrated rim enhancing fluid collections about the abdomen. There is a new collection about the anterior left lobe of the liver and gastrohepatic ligament measuring 15.8 x 6.0 cm (series 2, image 58). There are multiple additional fluid collections which have generally decreased in size, most notably anterior to the pancreatic body and tail, measuring 12.0 x 3.3 cm, previously 14.9 x 4.3 cm when measured similarly (series 2, image 66) and within the splenogastric ligament measuring 5.8 x 1.9 cm, previously 7.9 x 4.5 cm when measured similarly (series 2, image 46). Musculoskeletal: No acute or significant osseous findings. IMPRESSION: 1. Moderate left, small right pleural effusion with associated atelectasis or consolidation. Left pleural effusion is slightly decreased compared to prior examination. 2. Redemonstrated small volume ascites. The peritoneum generally appears slightly thickened and enhancing. 3. There are multiple  redemonstrated rim enhancing fluid collections about the abdomen. There is a new collection about the anterior left lobe of the liver and gastrohepatic ligament measuring 15.8 x 6.0 cm (series 2, image 58). There are multiple additional fluid collections which have generally decreased in size, most notably anterior to the pancreatic body and tail, measuring 12.0 x 3.3 cm, previously 14.9 x 4.3 cm when measured similarly (series 2, image 66) and within the splenogastric ligament measuring 5.8 x 1.9 cm, previously 7.9 x 4.5 cm when measured similarly (series 2, image 46). Fluctuation of fluid collections may to some extent reflect redistribution of fluid within multiple communicating collections or alternately ongoing development of inflammatory fluid collections. The presence or absence of infection is not established. 4.  Other chronic and incidental findings as detailed above. Electronically Signed   By: Lauralyn Primes M.D.   On: 07/10/2019 16:36   Ct Chest W Contrast  Result Date: 06/25/2019 CLINICAL DATA:  Shortness of breath, acute on chronic pancreatitis with multiple peripancreatic fluid collections, recurrent epigastric pain EXAM: CT CHEST, ABDOMEN, AND PELVIS WITH CONTRAST TECHNIQUE: Multidetector CT imaging of the chest, abdomen and pelvis was performed following the standard protocol during bolus administration of intravenous contrast. 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 Normal  heart size. No pericardial effusion. Mediastinum/Nodes: No enlarged mediastinal, hilar, or axillary lymph nodes. Thyroid gland, trachea, and esophagus demonstrate no significant findings. Lungs/Pleura: There is a large left pleural effusion with associated atelectasis or consolidation, enlarged compared to prior examination. There is a  new, trace right pleural effusion. There are scattered subpleural ground-glass pulmonary opacities, most conspicuous in the right upper lobe. Musculoskeletal: No chest wall mass or suspicious bone lesions identified. CT ABDOMEN PELVIS FINDINGS Hepatobiliary: No focal liver abnormality is seen. Status post cholecystectomy. No biliary dilatation. Pancreas: Redemonstrated extensive retroperitoneal inflammation and multiple peripancreatic fluid collections. There is a new discrete fluid collection or component adjacent to the greater curvature of the stomach and spleen measuring approximately 6.2 x 2.7 cm (series 3, image 52). There is an additional new fluid collection within or adjacent to the omentum measuring 4.7 x 2.5 cm (series 3, image 75). There is an additional new fluid collection within the small bowel mesentery measuring 5.0 x 3.2 cm (series 3, image 86). Other fluid collections are not significantly changed, for example anterior to the pancreatic neck (series 3, image 67), adjacent to the porta hepatis (series 3, image 67), adjacent to the left kidney and splenic flexure (series 3, image 73), and posterior to the left kidney (series 3, image 65). Spleen: Splenomegaly, maximum span 14.4 cm. Adrenals/Urinary Tract: Adrenal glands are unremarkable. Kidneys are normal, without renal calculi, solid lesion, or hydronephrosis. Bladder is unremarkable. Stomach/Bowel: Stomach is within normal limits. Appendix appears normal. Inflammatory thickening of the transverse colon (series 3, image 81), similar to prior examination, and of the mid small bowel, particularly a segment in the anterior abdomen (series 3, image 95). Vascular/Lymphatic: Aortic atherosclerosis. No enlarged abdominal or pelvic lymph nodes. Reproductive: No mass or other abnormality. Other: Extensive anasarca. Small volume ascites, similar to prior examination. Musculoskeletal: No acute or significant osseous findings. IMPRESSION: 1. There is a large  left pleural effusion with associated atelectasis or consolidation, enlarged compared to prior examination. There is a new, trace right pleural effusion. 2. There are scattered subpleural ground-glass pulmonary opacities, most conspicuous in the right upper lobe. These are nonspecific and infectious or inflammatory. 3. Redemonstrated extensive retroperitoneal inflammation and multiple peripancreatic fluid collections. There is a new discrete fluid collection or component adjacent to the greater curvature of the stomach and spleen measuring approximately 6.2 x 2.7 cm (series 3, image 52). There is an additional new fluid collection within or adjacent to the omentum measuring 4.7 x 2.5 cm (series 3, image 75). There is an additional new fluid collection within the small bowel mesentery measuring 5.0 x 3.2 cm (series 3, image 86). 4. Other fluid collections are not significantly changed, for example anterior to the pancreatic neck (series 3, image 67), adjacent to the porta hepatis (series 3, image 67), adjacent to the left kidney and splenic flexure (series 3, image 73), and posterior to the left kidney (series 3, image 65). 5. Inflammatory thickening of the transverse colon (series 3, image 81), similar to prior examination, and of the mid small bowel, particularly a segment in the anterior abdomen (series 3, image 95). 6. Extensive anasarca. Small volume ascites, similar to prior examination. Electronically Signed   By: Lauralyn Primes M.D.   On: 06/25/2019 14:18   Ct Angio Chest Pe W/cm &/or Wo Cm  Result Date: 07/05/2019 CLINICAL DATA:  Chest pain.  Complicated pancreatitis. EXAM: CT ANGIOGRAPHY CHEST WITH CONTRAST TECHNIQUE: Multidetector CT imaging of the chest was performed using the standard protocol during  bolus administration of intravenous contrast. Multiplanar CT image reconstructions and MIPs were obtained to evaluate the vascular anatomy. CONTRAST:  OMNIPAQUE IOHEXOL 350 MG/ML SOLN COMPARISON:   Chest CT from 10 days ago FINDINGS: Cardiovascular: Satisfactory opacification of the pulmonary arteries to the segmental level. No evidence of pulmonary embolism when accounting for intermittent motion artifact. Normal heart size. No pericardial effusion. Aberrant right subclavian artery Mediastinum/Nodes: Negative for adenopathy or mass. Lungs/Pleura: Large left pleural effusion that is layering. Trace right pleural fluid. Near complete collapse of the left lower lobe. No consolidation or edema. Upper Abdomen: As reported separately. Musculoskeletal: No acute or aggressive finding Review of the MIP images confirms the above findings. IMPRESSION: 1. Negative for pulmonary embolism. 2. Large layering left pleural effusion with near complete lower lobe collapse. Electronically Signed   By: Marnee Spring M.D.   On: 07/05/2019 07:16   Ct Abdomen Pelvis W Contrast  Result Date: 07/10/2019 CLINICAL DATA:  Pleural effusion, abdominal pain, pancreatitis, increased WBC EXAM: CT CHEST, ABDOMEN, AND PELVIS WITH CONTRAST TECHNIQUE: Multidetector CT imaging of the chest, abdomen and pelvis was performed following the standard protocol during bolus administration of intravenous contrast. 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 Normal heart size. No pericardial effusion. Mediastinum/Nodes: No enlarged mediastinal, hilar, or axillary lymph nodes. Thyroid gland, trachea, and esophagus demonstrate no significant findings. Lungs/Pleura: Moderate left, small right pleural effusion with associated atelectasis or consolidation. Left pleural effusion is slightly decreased compared to prior examination. Musculoskeletal: No chest wall mass or  suspicious bone lesions identified. CT ABDOMEN PELVIS FINDINGS Hepatobiliary: No solid liver abnormality is seen. Mildly coarse, nodular contour of the liver. Status post cholecystectomy. Pancreas: Unremarkable. No pancreatic ductal dilatation or surrounding inflammatory changes. Spleen: Normal in size without significant abnormality. Adrenals/Urinary Tract: Adrenal glands are unremarkable. Kidneys are normal, without renal calculi, solid lesion, or hydronephrosis. There is redemonstrated soft tissue thickening in the posterior perirenal fat, likely related to inflammatory fat necrosis and unchanged from prior. Bladder is unremarkable. Stomach/Bowel: Stomach is within normal limits. Weighted enteric feeding tube is positioned with tip in the stomach. Appendix appears normal. There are multiple loops of contrast filled although not overtly distended proximal to mid small bowel. There is gas and fluid in the colon to the rectum. Vascular/Lymphatic: Aortic atherosclerosis. Prominent mesenteric lymph nodes and/or fluid in the central small bowel mesentery unchanged from prior (series 2, image 74). Reproductive: No mass or other abnormality. Other: No abdominal wall hernia or abnormality. Redemonstrated small volume ascites. The peritoneum generally appears slightly thickened and enhancing. There are multiple redemonstrated rim enhancing fluid collections about the abdomen. There is a new collection about the anterior left lobe of the liver and gastrohepatic ligament measuring 15.8 x 6.0 cm (series 2, image 58). There are multiple additional fluid collections which have generally decreased in size, most notably anterior to the pancreatic body and tail, measuring 12.0 x 3.3 cm, previously 14.9 x 4.3 cm when measured similarly (series 2, image 66) and within the splenogastric ligament measuring 5.8 x 1.9 cm, previously 7.9 x 4.5 cm when measured similarly (series 2, image 46). Musculoskeletal: No acute or significant  osseous findings. IMPRESSION: 1. Moderate left, small right pleural effusion with associated atelectasis or consolidation. Left pleural effusion is slightly decreased compared to  prior examination. 2. Redemonstrated small volume ascites. The peritoneum generally appears slightly thickened and enhancing. 3. There are multiple redemonstrated rim enhancing fluid collections about the abdomen. There is a new collection about the anterior left lobe of the liver and gastrohepatic ligament measuring 15.8 x 6.0 cm (series 2, image 58). There are multiple additional fluid collections which have generally decreased in size, most notably anterior to the pancreatic body and tail, measuring 12.0 x 3.3 cm, previously 14.9 x 4.3 cm when measured similarly (series 2, image 66) and within the splenogastric ligament measuring 5.8 x 1.9 cm, previously 7.9 x 4.5 cm when measured similarly (series 2, image 46). Fluctuation of fluid collections may to some extent reflect redistribution of fluid within multiple communicating collections or alternately ongoing development of inflammatory fluid collections. The presence or absence of infection is not established. 4.  Other chronic and incidental findings as detailed above. Electronically Signed   By: Lauralyn Primes M.D.   On: 07/10/2019 16:36   Ct Abdomen Pelvis W Contrast  Result Date: 06/25/2019 CLINICAL DATA:  Shortness of breath, acute on chronic pancreatitis with multiple peripancreatic fluid collections, recurrent epigastric pain EXAM: CT CHEST, ABDOMEN, AND PELVIS WITH CONTRAST TECHNIQUE: Multidetector CT imaging of the chest, abdomen and pelvis was performed following the standard protocol during bolus administration of intravenous contrast. 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 Normal  heart size. No pericardial effusion. Mediastinum/Nodes: No enlarged mediastinal, hilar, or axillary lymph nodes. Thyroid gland, trachea, and esophagus demonstrate no significant findings. Lungs/Pleura: There is a large left pleural effusion with associated atelectasis or consolidation, enlarged compared to prior examination. There is a new, trace right pleural effusion. There are scattered subpleural ground-glass pulmonary opacities, most conspicuous in the right upper lobe. Musculoskeletal: No chest wall mass or suspicious bone lesions identified. CT ABDOMEN PELVIS FINDINGS Hepatobiliary: No focal liver abnormality is seen. Status post cholecystectomy. No biliary dilatation. Pancreas: Redemonstrated extensive retroperitoneal inflammation and multiple peripancreatic fluid collections. There is a new discrete fluid collection or component adjacent to the greater curvature of the stomach and spleen measuring approximately 6.2 x 2.7 cm (series 3, image 52). There is an additional new fluid collection within or adjacent to the omentum measuring 4.7 x 2.5 cm (series 3, image 75). There is an additional new fluid collection within the small bowel mesentery measuring 5.0 x 3.2 cm (series 3, image 86). Other fluid collections are not significantly changed, for example anterior to the pancreatic neck (series 3, image 67), adjacent to the porta hepatis (series 3, image 67), adjacent to the left kidney and splenic flexure (series 3, image 73), and posterior to the left kidney (series 3, image 65). Spleen: Splenomegaly, maximum span 14.4 cm. Adrenals/Urinary Tract: Adrenal glands are unremarkable. Kidneys are normal, without renal calculi, solid lesion, or hydronephrosis. Bladder is unremarkable. Stomach/Bowel: Stomach is within normal limits. Appendix appears normal. Inflammatory thickening of the transverse colon (series 3, image 81), similar to prior examination, and of the mid small bowel, particularly a segment in the  anterior abdomen (series 3, image 95). Vascular/Lymphatic: Aortic atherosclerosis. No enlarged abdominal or pelvic lymph nodes. Reproductive: No mass or other abnormality. Other: Extensive anasarca. Small volume ascites, similar to prior examination. Musculoskeletal: No acute or significant osseous findings. IMPRESSION: 1. There is a large left pleural effusion with associated atelectasis or consolidation, enlarged compared to prior examination.  There is a new, trace right pleural effusion. 2. There are scattered subpleural ground-glass pulmonary opacities, most conspicuous in the right upper lobe. These are nonspecific and infectious or inflammatory. 3. Redemonstrated extensive retroperitoneal inflammation and multiple peripancreatic fluid collections. There is a new discrete fluid collection or component adjacent to the greater curvature of the stomach and spleen measuring approximately 6.2 x 2.7 cm (series 3, image 52). There is an additional new fluid collection within or adjacent to the omentum measuring 4.7 x 2.5 cm (series 3, image 75). There is an additional new fluid collection within the small bowel mesentery measuring 5.0 x 3.2 cm (series 3, image 86). 4. Other fluid collections are not significantly changed, for example anterior to the pancreatic neck (series 3, image 67), adjacent to the porta hepatis (series 3, image 67), adjacent to the left kidney and splenic flexure (series 3, image 73), and posterior to the left kidney (series 3, image 65). 5. Inflammatory thickening of the transverse colon (series 3, image 81), similar to prior examination, and of the mid small bowel, particularly a segment in the anterior abdomen (series 3, image 95). 6. Extensive anasarca. Small volume ascites, similar to prior examination. Electronically Signed   By: Lauralyn Primes M.D.   On: 06/25/2019 14:18   Ct Abdomen Pelvis W Contrast  Result Date: 06/20/2019 CLINICAL DATA:  Abdominal pain.  Neutropenia. EXAM: CT ABDOMEN  AND PELVIS WITH CONTRAST TECHNIQUE: Multidetector CT imaging of the abdomen and pelvis was performed using the standard protocol following bolus administration of intravenous contrast. CONTRAST:  OMNIPAQUE IOHEXOL 300 MG/ML  SOLN COMPARISON:  None. FINDINGS: Lower chest: There is a moderate-sized left-sided pleural effusion with near complete collapse of the left lower lobe.The heart size is normal. Hepatobiliary: The liver is normal. Status post cholecystectomy.There is no biliary ductal dilation. Pancreas: There are multiple peripancreatic fluid collections the largest of which measures approximately 3.7 by 1.8 cm. The pancreas appears to enhance symmetrically. Spleen: The spleen is enlarged measuring approximately 14 cm craniocaudad. Adrenals/Urinary Tract: --Adrenal glands: No adrenal hemorrhage. --Right kidney/ureter: No hydronephrosis or perinephric hematoma. --Left kidney/ureter: There is no left-sided hydronephrosis. There is a complex collection in the left posterior pararenal space measuring approximately 8.5 x 2.5 cm. --Urinary bladder: Unremarkable. Stomach/Bowel: --Stomach/Duodenum: There is some wall thickening of the stomach. --Small bowel: No dilatation or inflammation. --Colon: There are soft tissue densities along the descending colon measuring approximately 2.8 x 2.4 cm. There is a collection of the splenic flexure measuring 4.7 x 2.7 cm causing mass effect on the nearby:Marland Kitchen There is wall thickening of the transverse colon without evidence of an obstruction. --Appendix: Not visualized. No right lower quadrant inflammation or free fluid. Vascular/Lymphatic: Atherosclerotic changes are noted of the abdominal aorta without evidence of an abdominal aortic aneurysm. The portal vein and splenic vein remain patent. The splenic artery remains patent. --No retroperitoneal lymphadenopathy. --No mesenteric lymphadenopathy. --No pelvic or inguinal lymphadenopathy. Reproductive: Unremarkable Other: There  is a small volume of free fluid in the pelvis. There are multiple scattered collections in the retroperitoneum and peritoneal cavity. A few these collections demonstrate mild rim enhancement. For example in the left upper quadrant there is a 3.9 x 1.9 cm collection that demonstrates mild peripheral rim enhancement. There is a 3.8 by 4.4 cm collection in the region of the gallbladder fossa. Given the surgical clips in the gallbladder fossa this is favored to represent a loculated fluid collection as opposed to a remnant gallbladder. Musculoskeletal. No acute displaced fractures.  IMPRESSION: 1. Overall findings concerning for pancreatitis with multiple loculated fluid collections as detailed above. Some of the smaller collections, for example in the left upper quadrant, demonstrate rim enhancement. An abscess is not excluded. There is no CT evidence for pancreatic necrosis. 2. Moderate-sized left-sided pleural effusion with at least partial collapse of the left lower lobe. 3. Diffuse wall thickening of the transverse colon and splenic flexure favored to be reactive. Other considerations include infectious or inflammatory colitis. 4. Splenomegaly.  The splenic vein remains patent. Electronically Signed   By: Katherine Mantle M.D.   On: 06/20/2019 21:07   Dg Chest Port 1 View  Result Date: 07/12/2019 CLINICAL DATA:  Pt reported right sided chest wall pain. Medical hx of enlarging pancreatic pseudocysts and one that is larger and newer in upper abdomen exerting mass effect on anterior aspect of left lobe of liver. EXAM: PORTABLE CHEST 1 VIEW COMPARISON:  07/10/2019 FINDINGS: Shallow lung inflation. Bilateral pleural effusions and bibasilar opacities appear stable. Feeding tube has been withdrawn, tip now within the level of the mid esophagus. IMPRESSION: Feeding tube tip within the mid esophagus. These results will be called to the ordering clinician or representative by the Radiologist Assistant, and communication  documented in the PACS or zVision Dashboard. Electronically Signed   By: Norva Pavlov M.D.   On: 07/12/2019 14:07   Dg Chest Port 1 View  Result Date: 07/10/2019 CLINICAL DATA:  Leukocytosis EXAM: PORTABLE CHEST 1 VIEW COMPARISON:  None. FINDINGS: Feeding tube tip is in the distal esophagus. There is a small left pleural effusion with patchy consolidation in the left base. There is atelectatic change in each lung base as well. Heart is upper normal in size with pulmonary vascularity normal. No adenopathy. No bone lesions. IMPRESSION: Feeding tube tip in distal esophagus. Persistent patchy consolidation left base with bibasilar atelectasis. Small left pleural effusion. Stable cardiac silhouette. These results will be called to the ordering clinician or representative by the Radiologist Assistant, and communication documented in the PACS or zVision Dashboard. Electronically Signed   By: Bretta Bang III M.D.   On: 07/10/2019 08:16   Dg Chest Port 1 View  Result Date: 07/05/2019 CLINICAL DATA:  Status post left-sided thoracentesis EXAM: PORTABLE CHEST 1 VIEW COMPARISON:  July 05, 2019 FINDINGS: The left-sided pleural effusion has significantly decreased in size from the prior study. There is no evidence for left-sided pneumothorax. Bibasilar airspace opacities are noted, left worse than right. The lung volumes are low. The heart size is stable from prior study. IMPRESSION: 1. No pneumothorax status post left-sided thoracentesis. 2. Significant interval decrease in size of the left-sided pleural effusion. 3. Bibasilar airspace opacities, left greater than right, favored to represent atelectasis. 4. Low lung volumes. Electronically Signed   By: Katherine Mantle M.D.   On: 07/05/2019 16:31   Dg Chest Portable 1 View  Result Date: 07/05/2019 CLINICAL DATA:  Chest pain. EXAM: PORTABLE CHEST 1 VIEW COMPARISON:  Radiograph 06/26/2019, CT 06/25/2019 FINDINGS: Re-accumulation of left pleural effusion  after thoracentesis. Pleural effusion is moderate in size with associated underlying left lung base atelectasis/consolidation. Possible small right pleural effusion. Unchanged heart size and mediastinal contours. Increasing patchy right lung base opacity. No pneumothorax. IMPRESSION: 1. Re-accumulation of left pleural effusion after thoracentesis, now moderate in size. Associated left lung base atelectasis/consolidation. 2. Possible small right pleural effusion. Increasing right lung base opacity may be atelectasis or pneumonia. Electronically Signed   By: Narda Rutherford M.D.   On: 07/05/2019 03:22  Dg Chest Port 1 View  Result Date: 06/24/2019 CLINICAL DATA:  Chest pain for 1 hour EXAM: PORTABLE CHEST 1 VIEW COMPARISON:  06/20/2019 FINDINGS: Left-sided pleural effusion is again seen and slightly enlarged when compare with the prior study. Likely underlying atelectasis/infiltrate is present in the left base. The right lung is clear. No bony abnormality is noted. IMPRESSION: Increasing left-sided pleural effusion. Electronically Signed   By: Alcide Clever M.D.   On: 06/24/2019 19:33   Dg Chest Portable 1 View  Result Date: 06/20/2019 CLINICAL DATA:  Chest pain EXAM: PORTABLE CHEST 1 VIEW COMPARISON:  None. FINDINGS: There is airspace consolidation in the left base with left pleural effusion. Lungs elsewhere are clear. Heart size and pulmonary vascularity are normal. No adenopathy. No bone lesions. IMPRESSION: Left lower lobe airspace consolidation consistent with pneumonia. Small left pleural effusion. Lungs elsewhere clear. No adenopathy. Heart size normal. Followup PA and lateral chest radiographs recommended in 3-4 weeks following trial of antibiotic therapy to ensure resolution and exclude underlying malignancy. Electronically Signed   By: Bretta Bang III M.D.   On: 06/20/2019 19:32   Dg Abd Portable 1v  Result Date: 07/11/2019 CLINICAL DATA:  Per RN-patient called that he moved to use the  bathroom and he displaced the core track feeding tube from 70 mark to 35. I advanced it back but he is c/o pain and says he feels its is displaced." EXAM: PORTABLE ABDOMEN - 1 VIEW COMPARISON:  Radiograph yesterday at 10:51 a.m. FINDINGS: Tip of the weighted enteric tube is in the left upper quadrant below the diaphragm, in the pre pyloric stomach. Surgical clips in the right abdomen. IMPRESSION: Tip of the weighted enteric tube below the diaphragm in the pre pyloric stomach. Electronically Signed   By: Narda Rutherford M.D.   On: 07/11/2019 00:45   Dg Abd Portable 1v  Result Date: 07/10/2019 CLINICAL DATA:  Check feeding catheter placement EXAM: PORTABLE ABDOMEN - 1 VIEW COMPARISON:  07/09/2019 FINDINGS: Weighted feeding catheter is noted with the tip in the distal stomach. Mildly dilated small bowel is noted similar to that seen on the prior exam. IMPRESSION: Feeding catheter within the distal stomach. Electronically Signed   By: Alcide Clever M.D.   On: 07/10/2019 11:38   Dg Abd Portable 1v  Result Date: 07/09/2019 CLINICAL DATA:  Feeding tube placement EXAM: PORTABLE ABDOMEN - 1 VIEW COMPARISON:  07/05/2019 FINDINGS: Bibasilar pleural effusions and airspace disease. Esophageal tube tip overlies the proximal stomach. Dilated small bowel in the central abdomen. IMPRESSION: Esophageal tube tip overlies the proximal stomach. Electronically Signed   By: Jasmine Pang M.D.   On: 07/09/2019 19:32   Ct Renal Stone Study  Result Date: 07/05/2019 CLINICAL DATA:  Chest pain. EXAM: CT ABDOMEN AND PELVIS WITHOUT CONTRAST TECHNIQUE: Multidetector CT imaging of the abdomen and pelvis was performed following the standard protocol without IV contrast. COMPARISON:  Fifteen days ago FINDINGS: Lower chest: At least moderate left pleural effusion with multi segment atelectasis. Pending chest CT. Hepatobiliary: No focal liver abnormality.Cholecystectomy. Pancreas: History of recurrent pancreatitis. Progressive organized  collections: 1. 8 x 4 cm along the upper greater curvature of the stomach 2. 6 cm along the mid greater curvature of the stomach-which may be connected to #1. 3. Peripancreatic to splenic tail, 15 x 4 by up to 6 cm-with further extent tracking inferiorly along the descending colon. 4. Ligamentum venosum of the liver measuring 5 x 3 cm 5. Ventral midline abdomen at 5 cm 6. Lower ventral  midline abdomen with interloop distortion, 9 cm. These collections exert mass effect on the stomach, transverse colon, and descending colon. Fat density with rim of stranding posterior to the left kidney in the posterior pararenal space, stable in compatible with fat necrosis. There is generalized retroperitoneal edema about the collections and pancreas. No gas within any of the collections to suggest fistula or definite superinfection. Spleen: Stable size and density. Adrenals/Urinary Tract: Negative adrenals. No hydronephrosis or stone. Unremarkable bladder. Stomach/Bowel: No obstruction. Low-density about the hepatic flexure is likely a combination of reactive wall thickening and pericolonic fluid. Vascular/Lymphatic: No visible acute vascular abnormality. No noted adenopathy. Reproductive:Negative Other: Right upper quadrant ascites which may be loculated. Musculoskeletal: No acute abnormalities. IMPRESSION: 1. Recent pancreatitis with multiple organized fluid collections in the abdomen as listed and measured above. The largest measures 15 x 4 x 6 cm. Collections exert mass effect on the stomach, transverse colon, descending colon without obstruction. 2. Stable left posterior pararenal fat necrosis. 3. At least moderate left pleural effusion with multi segment atelectasis. Electronically Signed   By: Marnee Spring M.D.   On: 07/05/2019 07:12   Ir Thoracentesis Asp Pleural Space W/img Guide  Result Date: 06/26/2019 INDICATION: Shortness of breath. Left-sided pleural effusion. Request for diagnostic and therapeutic  thoracentesis. EXAM: ULTRASOUND GUIDED LEFT THORACENTESIS MEDICATIONS: None. COMPLICATIONS: None immediate. PROCEDURE: An ultrasound guided thoracentesis was thoroughly discussed with the patient and questions answered. The benefits, risks, alternatives and complications were also discussed. The patient understands and wishes to proceed with the procedure. Written consent was obtained. Ultrasound was performed to localize and mark an adequate pocket of fluid in the left chest. The area was then prepped and draped in the normal sterile fashion. 1% Lidocaine was used for local anesthesia. Under ultrasound guidance a 6 Fr Safe-T-Centesis catheter was introduced. Thoracentesis was performed. The catheter was removed and a dressing applied. FINDINGS: A total of approximately 1.3 L of hazy yellow fluid was removed. Samples were sent to the laboratory as requested by the clinical team. IMPRESSION: Successful ultrasound guided left thoracentesis yielding 1.3 L of pleural fluid. Read by: Brayton El PA-C Electronically Signed   By: Richarda Overlie M.D.   On: 06/26/2019 09:19   US Thoracentesis Asp Pleural Space W/img Guide  Result Date: 07/05/2019 INDICATION: Patient with history of recurrent pancreatitis, GERD, dyspnea, recurrent left pleural effusion. Request made for diagnostic and therapeutic left thoracentesis. EXAM: ULTRASOUND GUIDED DIAGNOSTIC AND THERAPEUTIC LEFT THORACENTESIS MEDICATIONS: None COMPLICATIONS: None immediate. PROCEDURE: An ultrasound guided thoracentesis was thoroughly discussed with the patient and questions answered. The benefits, risks, alternatives and complications were also discussed. The patient understands and wishes to proceed with the procedure. Written consent was obtained. Ultrasound was performed to localize and mark an adequate pocket of fluid in the left chest. The area was then prepped and draped in the normal sterile fashion. 1% Lidocaine was used for local anesthesia. Under  ultrasound guidance a 6 Fr Safe-T-Centesis catheter was introduced. Thoracentesis was performed. The catheter was removed and a dressing applied. FINDINGS: A total of approximately 770 cc of yellow fluid was removed. Samples were sent to the laboratory as requested by the clinical team. IMPRESSION: Successful ultrasound guided diagnostic and therapeutic left thoracentesis yielding 770 cc of pleural fluid. Read by: Jeananne Rama, PA-C Electronically Signed   By: Richarda Overlie M.D.   On: 07/05/2019 16:12    Time Spent in minutes 35   Ellon Marasco M.D on 07/13/2019 at 12:23 PM  Between 7am to  7pm - Pager - [phone]  After 7pm go to [email or web address] - password [facility]  [facility] -  Office  [phone]

## 2019-07-13 NOTE — Progress Notes (Signed)
Patient c/o left side chest pain 5/10 with movement/coughing.  Patient states it goes away with rest.  Heat applied.  Dr. Clementeen Graham notified via text page.  No new orders.  Will continue to monitor.

## 2019-07-13 NOTE — Progress Notes (Signed)
   07/13/19 1015  Vitals  Temp 100.1 F (37.8 C)  Temp Source Oral  BP 112/73  BP Location Right Arm  BP Method Automatic  Patient Position (if appropriate) Lying  Pulse Rate (!) 104  Pulse Rate Source Monitor  Oxygen Therapy  SpO2 97 %  O2 Device Nasal Cannula  O2 Flow Rate (L/min) 2 L/min  Dr.  Clementeen Graham notified via text page of patient's vital signs and previous BP 97/71.

## 2019-07-14 ENCOUNTER — Inpatient Hospital Stay (HOSPITAL_COMMUNITY): Payer: Self-pay

## 2019-07-14 LAB — BASIC METABOLIC PANEL
Anion gap: 5 (ref 5–15)
BUN: 14 mg/dL (ref 6–20)
CO2: 30 mmol/L (ref 22–32)
Calcium: 8.1 mg/dL — ABNORMAL LOW (ref 8.9–10.3)
Chloride: 95 mmol/L — ABNORMAL LOW (ref 98–111)
Creatinine, Ser: <0.3 mg/dL — ABNORMAL LOW (ref 0.61–1.24)
Glucose, Bld: 101 mg/dL — ABNORMAL HIGH (ref 70–99)
Potassium: 4.5 mmol/L (ref 3.5–5.1)
Sodium: 130 mmol/L — ABNORMAL LOW (ref 135–145)

## 2019-07-14 LAB — CBC
HCT: 29.3 % — ABNORMAL LOW (ref 39.0–52.0)
Hemoglobin: 8.9 g/dL — ABNORMAL LOW (ref 13.0–17.0)
MCH: 27.3 pg (ref 26.0–34.0)
MCHC: 30.4 g/dL (ref 30.0–36.0)
MCV: 89.9 fL (ref 80.0–100.0)
Platelets: 411 10*3/uL — ABNORMAL HIGH (ref 150–400)
RBC: 3.26 MIL/uL — ABNORMAL LOW (ref 4.22–5.81)
RDW: 14.2 % (ref 11.5–15.5)
WBC: 20.9 10*3/uL — ABNORMAL HIGH (ref 4.0–10.5)
nRBC: 0 % (ref 0.0–0.2)

## 2019-07-14 LAB — GLUCOSE, CAPILLARY
Glucose-Capillary: 99 mg/dL (ref 70–99)
Glucose-Capillary: 86 mg/dL (ref 70–99)
Glucose-Capillary: 93 mg/dL (ref 70–99)

## 2019-07-14 MED ORDER — KETOROLAC TROMETHAMINE 15 MG/ML IJ SOLN
15.0000 mg | Freq: Once | INTRAMUSCULAR | Status: AC
  Administered 2019-07-14: 15 mg via INTRAVENOUS
  Filled 2019-07-14: qty 1

## 2019-07-14 MED ORDER — KETOROLAC TROMETHAMINE 30 MG/ML IJ SOLN
30.0000 mg | Freq: Once | INTRAMUSCULAR | Status: AC
  Administered 2019-07-14: 30 mg via INTRAVENOUS
  Filled 2019-07-14: qty 1

## 2019-07-14 NOTE — TOC Progression Note (Signed)
Transition of Care [facility]) - Progression Note    Patient Details  Name: Kyle Mooney MRN: [id number] Date of Birth: September 16, 1971  [facility] CM/SW Contact  Purcell Mouton, RN Phone Number: 07/14/2019, 11:10 AM  Clinical Narrative:    Pt to transfer to [facility] when bed is available.    Expected Discharge Plan: Acute to Acute Transfer Barriers to Discharge: No Barriers Identified  Expected Discharge Plan and Services Expected Discharge Plan: Acute to Acute Transfer   Discharge Planning Services: CM Consult   Living arrangements for the past 2 months: Single Family Home                                       Social Determinants of Health (SDOH) Interventions    Readmission Risk Interventions No flowsheet data found.

## 2019-07-14 NOTE — Progress Notes (Signed)
Subjective: Right sided pain is improving.  It is not as sharp, but it is still uncomfortable.  Objective: Vital signs in last 24 hours: Temp:  [97.7 F (36.5 C)-98.7 F (37.1 C)] 98.3 F (36.8 C) (08/25 1305) Pulse Rate:  [84-121] 84 (08/25 1305) Resp:  [14-25] 14 (08/25 1410) BP: (99-131)/(65-80) 99/65 (08/25 1305) SpO2:  [96 %-100 %] 99 % (08/25 1410) Weight:  [81.6 kg] 81.6 kg (08/25 0458) Last BM Date: 07/12/19  Intake/Output from previous day: 08/24 0701 - 08/25 0700 In: 348.5 [I.V.:148.5; IV Piggyback:200] Out: 250 [Urine:250] Intake/Output this shift: No intake/output data recorded.  General appearance: alert and no distress GI: soft, non-tender; bowel sounds normal; no masses,  no organomegaly  Lab Results: Recent Labs    07/12/19 0820 07/13/19 0839 07/14/19 0431  WBC 32.8* 27.9* 20.9*  HGB 9.8* 9.7* 8.9*  HCT 32.0* 31.6* 29.3*  PLT 528* 526* 411*   BMET Recent Labs    07/12/19 0820 07/13/19 0839 07/14/19 0431  NA 130* 126* 130*  K 4.3 4.7 4.5  CL 91* 91* 95*  CO2 28 27 30   GLUCOSE 110* 124* 101*  BUN 11 13 14   CREATININE 0.46* 0.38* <0.30*  CALCIUM 8.1* 7.9* 8.1*   LFT Recent Labs    07/13/19 0839  PROT 5.6*  ALBUMIN 1.6*  AST 51*  ALT 46*  ALKPHOS 250*  BILITOT 0.6   PT/INR No results for input(s): LABPROT, INR in the last 72 hours. Hepatitis Panel No results for input(s): HEPBSAG, HCVAB, HEPAIGM, HEPBIGM in the last 72 hours. C-Diff No results for input(s): CDIFFTOX in the last 72 hours. Fecal Lactopherrin No results for input(s): FECLLACTOFRN in the last 72 hours.  Studies/Results: Dg Abd Portable 1v  Result Date: 07/14/2019 CLINICAL DATA:  NG tube placement. EXAM: PORTABLE ABDOMEN - 1 VIEW 11:55 a.m. COMPARISON:  07/14/2019 at 3:19 a.m. FINDINGS: The feeding tube tip is in the midbody of the stomach, essentially unchanged since the prior study. The visualized bowel gas pattern is normal. Small left pleural effusion is noted with  slight bibasilar atelectasis. IMPRESSION: Feeding tube tip in the mid body of the stomach, unchanged. Electronically Signed   By: Lorriane Shire M.Mooney.   On: 07/14/2019 12:51   Dg Abd Portable 1v  Result Date: 07/14/2019 CLINICAL DATA:  Nasogastric tube placement EXAM: PORTABLE ABDOMEN - 1 VIEW COMPARISON:  None. FINDINGS: The weighted tip of the enteric tube projects over the stomach. There is persistent small bowel dilatation. IMPRESSION: Weighted enteric tube tip projects over the stomach. Electronically Signed   By: Ulyses Jarred M.Mooney.   On: 07/14/2019 03:46    Medications:  Scheduled: . Chlorhexidine Gluconate Cloth  6 each Topical Q0600  . enoxaparin (LOVENOX) injection  40 mg Subcutaneous Q24H  . feeding supplement (PRO-STAT SUGAR FREE 64)  30 mL Per Tube BID  . folic acid  1 mg Oral Daily  . free water  100 mL Per Tube Q4H  . guaiFENesin  600 mg Oral BID  . lidocaine  1 patch Transdermal Q24H  . lip balm  1 application Topical BID  . metoprolol tartrate  25 mg Oral BID  . morphine   Intravenous Q4H  . multivitamin  1 tablet Oral Daily  . sodium chloride flush  10-40 mL Intracatheter Q12H  . thiamine  100 mg Oral Daily   Or  . thiamine  100 mg Intravenous Daily   Continuous: . sodium chloride 10 mL/hr at 07/12/19 1734  . famotidine (PEPCID) IV  20 mg (07/14/19 1046)  . feeding supplement (OSMOLITE 1.5 CAL) 60 mL/hr at 07/12/19 1734  . meropenem (MERREM) IV 1 g (07/14/19 [id number])    Assessment/Plan: 1) Idiopathic pancreatitis. 2) Large pseudocyst.   He remains well-controlled with pain medications.  He is tolerating his tube feeding.  WBC has declined down to 20 and there is no fever.  The right sided pain appears to have improved.  Since there is no increase in the WBC and there is no fever, no repeat imaging is necessary.  Plan: 1) Continue with the current management.  LOS: 9 days   Kyle Mooney 07/14/2019, 3:10 PM

## 2019-07-14 NOTE — Progress Notes (Signed)
PROGRESS NOTE                                                                                                                                                                                                             Patient Demographics:    Jimmi Bruski, is a 48 y.o. male, DOB - 15-Aug-1971, EXB:[id number]  Admit date - 07/05/2019   Admitting Physician A Grier Mitts., MD  Outpatient Primary MD for the patient is Patient, No Pcp Per  LOS - 9    Chief Complaint  Patient presents with   Chest Pain   Abdominal Pain       Brief Narrative   48 year old male with recurrent pancreatitis with developing pseudocyst, IBS and GERD who was followed at [facility] for his idiopathic pancreatitis and pancreatic cyst presented with recurrent epigastric pain suspicious for acute on chronic pancreatitis. He was last hospitalized at [facility] on 06/02/2019 with necrotizing pancreatitis. CT angiogram of the chest was negative for PE but showed large layering left pleural effusion with near complete lower lobe collapse. IR consulted and underwent thoracentesis of the left pleural effusion. Surgery consulted for evaluation of pseudocyst and recommended to wait for 6 weeks for the pseudocyst to mature and either follow-up with Dr. gross or at [facility] for further evaluation. Given his persistent pain he has been requiring IV morphine PCA. Hospital course prolonged due to persistent abdominal pain requiring morphine PCA and development of new likely infected pseudocyst.   Subjective:   Complaining of off and in pain over the right upper quadrant..   Assessment  & Plan :  Principal Problem: Idiopathic acute pancreatitis with infected pseudocyst On empiric IV meropenem.  Morphine PCA with intermittent IV Toradol for right upper quadrant pain. GI and surgery on board.  On enteral nutrition through core track. Per surgery the  pseudocyst would ideally need to mature for at least 6 weeks to attempt cystoscopy gastrostomy.  Developed new right-sided mid back pain possibly due to new pseudocyst that may be infected given his worsened WBC. Repeated CT of the abdomen on 8/21 new fluid collection measuring 5.8 x 6 cm which is concerning to be infected. IR evaluated patient for percutaneous drainage but patient refused.  I also recommend to avoid the drainage unless patient has significant decompensation.  Patient has been accepted at [facility] and has not been able to be transferred for past several days due to unavailability of bed.     Active Problems:   Hyponatremia Worsening sodium today.  Had improved with IV fluids previously.  Will resume fluids and monitor.  Severe protein calorie malnutrition (HCC) Enteral feeding through core track.  Tobacco abuse Counseled on cessation  Alcohol abuse No signs of withdrawal.    Leukocytosis and thrombocytosis Secondary to infected cyst.  WBC improving in a.m. lab.  Exudative pleural effusion, left  thoracentesis done on 8/17 with improvement noted on follow up CT from 8/21. On empiric meropenem.     Family Communication :None  Disposition Plan:[facility] once bed available (discharge summary completed on 8/23).    Consults :GI/surgery  Procedures : CT abdomen pelvis, left thoracentesis  Lab Results  Component Value Date   PLT 411 (H) 07/14/2019    Antibiotics  :  Anti-infectives (From admission, onward)   Start     Dose/Rate Route Frequency Ordered Stop   07/12/19 0000  meropenem 1 g in sodium chloride 0.9 % 100 mL     1 g Intravenous Every 8 hours 07/12/19 1311     07/05/19 1800  ceFEPIme (MAXIPIME) 2 g in sodium chloride 0.9 % 100 mL IVPB  Status:  Discontinued     2 g 200 mL/hr over 30 Minutes Intravenous Every 8 hours 07/05/19 0852 07/05/19 1013   07/05/19 1400  meropenem (MERREM) 1 g in sodium  chloride 0.9 % 100 mL IVPB     1 g 200 mL/hr over 30 Minutes Intravenous Every 8 hours 07/05/19 1013 07/15/19 1359   07/05/19 1000  metroNIDAZOLE (FLAGYL) IVPB 500 mg  Status:  Discontinued     500 mg 100 mL/hr over 60 Minutes Intravenous Every 8 hours 07/05/19 0833 07/05/19 1013   07/05/19 0345  vancomycin (VANCOCIN) IVPB 1000 mg/200 mL premix  Status:  Discontinued     1,000 mg 200 mL/hr over 60 Minutes Intravenous  Once 07/05/19 0333 07/05/19 0336   07/05/19 0345  ceFEPIme (MAXIPIME) 2 g in sodium chloride 0.9 % 100 mL IVPB     2 g 200 mL/hr over 30 Minutes Intravenous  Once 07/05/19 0333 07/05/19 1001   07/05/19 0345  vancomycin (VANCOCIN) 1,500 mg in sodium chloride 0.9 % 500 mL IVPB     1,500 mg 250 mL/hr over 120 Minutes Intravenous  Once 07/05/19 0336 07/05/19 1001        Objective:   Vitals:   07/14/19 0820 07/14/19 1135 07/14/19 1305 07/14/19 1410  BP: 104/70  99/65   Pulse: 93  84   Resp: 16 17 17 14   Temp: 98.5 F (36.9 C)  98.3 F (36.8 C)   TempSrc: Oral  Oral   SpO2: 98% 98% 99% 99%  Weight:      Height:        Wt Readings from Last 3 Encounters:  07/14/19 81.6 kg  06/21/19 75 kg     Intake/Output Summary (Last 24 hours) at 07/14/2019 1417 Last data filed at 07/13/2019 1544 Gross per 24 hour  Intake 348.53 ml  Output --  Net 348.53 ml   Physical exam Fatigued HEENT: Moist mucosa, supple neck, NG + Chest: Diminished breath sounds over left lung (improved from previous days) CVS: S1-S2 normal, no murmurs GI: Soft, right upper quadrant tenderness, nondistended, bowel sounds present Musculoskeletal: Warm, no edema    Data Review:    CBC Recent Labs  Lab 07/09/19 0420 07/10/19  7425 07/10/19 1254 07/12/19 0820 07/13/19 0839 07/14/19 0431  WBC 20.4* 28.0* 32.2* 32.8* 27.9* 20.9*  HGB 9.8* 10.8* 10.5* 9.8* 9.7* 8.9*  HCT 31.6* 34.6* 33.6* 32.0* 31.6* 29.3*  PLT 564* 676* 663* 528* 526* 411*  MCV 87.5 86.7 87.7 88.6 88.3 89.9  MCH 27.1 27.1  27.4 27.1 27.1 27.3  MCHC 31.0 31.2 31.3 30.6 30.7 30.4  RDW 13.8 14.1 14.2 14.1 14.4 14.2  LYMPHSABS 2.1  --  2.5  --   --   --   MONOABS 1.5*  --  2.3*  --   --   --   EOSABS 0.1  --  0.1  --   --   --   BASOSABS 0.0  --  0.1  --   --   --     Chemistries  Recent Labs  Lab 07/08/19 0403  07/09/19 1706 07/10/19 0521 07/10/19 1753 07/11/19 0443 07/12/19 0410 07/12/19 0820 07/13/19 0839 07/14/19 0431  NA 130*   < >  --  131*  --  130*  --  130* 126* 130*  K 4.0   < >  --  3.9  --  3.8  --  4.3 4.7 4.5  CL 94*   < >  --  93*  --  93*  --  91* 91* 95*  CO2 27   < >  --  29  --  30  --  28 27 30   GLUCOSE 106*   < >  --  124*  --  114*  --  110* 124* 101*  BUN 12   < >  --  10  --  11  --  11 13 14   CREATININE 0.51*   < >  --  0.49*  --  0.46* 0.43* 0.46* 0.38* <0.30*  CALCIUM 8.3*   < >  --  8.3*  --  7.9*  --  8.1* 7.9* 8.1*  MG 1.8  --  2.0 2.0 2.0  --   --   --   --   --   AST 25  --   --   --   --  36  --  44* 51*  --   ALT 20  --   --   --   --  34  --  41 46*  --   ALKPHOS 139*  --   --   --   --  172*  --  220* 250*  --   BILITOT 0.8  --   --   --   --  0.5  --  0.4 0.6  --    < > = values in this interval not displayed.   ------------------------------------------------------------------------------------------------------------------ No results for input(s): CHOL, HDL, LDLCALC, TRIG, CHOLHDL, LDLDIRECT in the last 72 hours.  No results found for: HGBA1C ------------------------------------------------------------------------------------------------------------------ No results for input(s): TSH, T4TOTAL, T3FREE, THYROIDAB in the last 72 hours.  Invalid input(s): FREET3 ------------------------------------------------------------------------------------------------------------------ No results for input(s): VITAMINB12, FOLATE, FERRITIN, TIBC, IRON, RETICCTPCT in the last 72 hours.  Coagulation profile No results for input(s): INR, PROTIME in the last 168  hours.  No results for input(s): DDIMER in the last 72 hours.  Cardiac Enzymes No results for input(s): CKMB, TROPONINI, MYOGLOBIN in the last 168 hours.  Invalid input(s): CK ------------------------------------------------------------------------------------------------------------------    Component Value Date/Time   BNP 98.7 07/05/2019 0420    Inpatient Medications  Scheduled Meds:  Chlorhexidine Gluconate Cloth  6 each Topical Q0600   enoxaparin (LOVENOX)  injection  40 mg Subcutaneous Q24H   feeding supplement (PRO-STAT SUGAR FREE 64)  30 mL Per Tube BID   folic acid  1 mg Oral Daily   free water  100 mL Per Tube Q4H   guaiFENesin  600 mg Oral BID   lidocaine  1 patch Transdermal Q24H   lip balm  1 application Topical BID   metoprolol tartrate  25 mg Oral BID   morphine   Intravenous Q4H   multivitamin  1 tablet Oral Daily   sodium chloride flush  10-40 mL Intracatheter Q12H   thiamine  100 mg Oral Daily   Or   thiamine  100 mg Intravenous Daily   Continuous Infusions:  sodium chloride 10 mL/hr at 07/12/19 1734   famotidine (PEPCID) IV 20 mg (07/14/19 1046)   feeding supplement (OSMOLITE 1.5 CAL) 60 mL/hr at 07/12/19 1734   meropenem (MERREM) IV 1 g (07/14/19 0628)   PRN Meds:.sodium chloride, acetaminophen **OR** acetaminophen, diphenhydrAMINE **OR** diphenhydrAMINE, magic mouthwash, menthol-cetylpyridinium, naloxone **AND** sodium chloride flush, ondansetron (ZOFRAN) IV, phenol, polyethylene glycol, sodium chloride flush  Micro Results Recent Results (from the past 240 hour(s))  Urine culture     Status: None   Collection Time: 07/05/19  2:46 AM   Specimen: In/Out Cath Urine  Result Value Ref Range Status   Specimen Description   Final    IN/OUT CATH URINE Performed at [facility], [address], [place], [place] [address]    Special Requests   Final    NONE Performed at [facility], [address] [place], [place] [address]    Culture   Final    NO GROWTH Performed at [facility], [address], [place], [place] [address]    Report Status 07/06/2019 FINAL  Final  Blood Culture (routine x 2)     Status: None   Collection Time: 07/05/19  3:33 AM   Specimen: BLOOD  Result Value Ref Range Status   Specimen Description   Final    BLOOD RIGHT ARM Performed at [facility], [address] [place], [place] [address]    Special Requests   Final    BOTTLES DRAWN AEROBIC ONLY Blood Culture results may not be optimal due to an inadequate volume of blood received in culture bottles Performed at [facility], [address] [place], [place] [address]    Culture   Final    NO GROWTH 5 DAYS Performed at [facility], [address], [place], [place] [address]    Report Status 07/10/2019 FINAL  Final  SARS Coronavirus 2 [facility] order, Performed in [facility] hospital lab) Nasopharyngeal Nasopharyngeal Swab     Status: None   Collection Time: 07/05/19  3:34 AM   Specimen: Nasopharyngeal Swab  Result Value Ref Range Status   SARS Coronavirus 2 NEGATIVE NEGATIVE Final    Comment: (NOTE) If result is NEGATIVE SARS-CoV-2 target nucleic acids are NOT DETECTED. The SARS-CoV-2 RNA is generally detectable in upper and lower  respiratory specimens during the acute phase of infection. The lowest  concentration of SARS-CoV-2 viral copies this assay can detect is 250  copies / mL. A negative result does not preclude SARS-CoV-2 infection  and should not be used as the sole basis for treatment or other  patient management decisions.  A negative result may occur with  improper specimen collection / handling, submission of specimen other  than nasopharyngeal swab, presence of  viral mutation(s) within the  areas targeted by this assay, and inadequate number of viral copies  (<250 copies / mL). A negative result must be combined with clinical   observations, patient history, and epidemiological information. If result is POSITIVE SARS-CoV-2 target nucleic acids are DETECTED. The SARS-CoV-2 RNA is generally detectable in upper and lower  respiratory specimens dur ing the acute phase of infection.  Positive  results are indicative of active infection with SARS-CoV-2.  Clinical  correlation with patient history and other diagnostic information is  necessary to determine patient infection status.  Positive results do  not rule out bacterial infection or co-infection with other viruses. If result is PRESUMPTIVE POSTIVE SARS-CoV-2 nucleic acids MAY BE PRESENT.   A presumptive positive result was obtained on the submitted specimen  and confirmed on repeat testing.  While 2019 novel coronavirus  (SARS-CoV-2) nucleic acids may be present in the submitted sample  additional confirmatory testing may be necessary for epidemiological  and / or clinical management purposes  to differentiate between  SARS-CoV-2 and other Sarbecovirus currently known to infect humans.  If clinically indicated additional testing with an alternate test  methodology [phone]) is advised. The SARS-CoV-2 RNA is generally  detectable in upper and lower respiratory sp ecimens during the acute  phase of infection. The expected result is Negative. Fact Sheet for Patients:  BoilerBrush.com.cy Fact Sheet for Healthcare Providers: https://pope.com/ This test is not yet approved or cleared by the [place] FDA and has been authorized for detection and/or diagnosis of SARS-CoV-2 by FDA under an Emergency Use Authorization (EUA).  This EUA will remain in effect (meaning this test can be used) for the duration of the COVID-19 declaration under Section 564(b)(1) of the Act, 21 U.S.C. section 360bbb-3(b)(1), unless the authorization is terminated or revoked sooner. Performed at [facility], [address], [place], [place] [address]   Blood Culture (routine x 2)     Status: None   Collection Time: 07/05/19  7:00 AM   Specimen: BLOOD  Result Value Ref Range Status   Specimen Description   Final    BLOOD LEFT Performed at [facility], [address] [place], [place] [address]    Special Requests   Final    BOTTLES DRAWN AEROBIC AND ANAEROBIC Blood Culture adequate volume Performed at [facility], [address] [place], [place] [address]    Culture   Final    NO GROWTH 5 DAYS Performed at [facility], [address], [place], [place] [address]    Report Status 07/10/2019 FINAL  Final  Gram stain     Status: None   Collection Time: 07/05/19  4:15 PM   Specimen: Fluid  Result Value Ref Range Status   Specimen Description FLUID  Final   Special Requests NONE  Final   Gram Stain   Final    FEW WBC PRESENT, PREDOMINANTLY MONONUCLEAR NO ORGANISMS SEEN Performed at [facility], [address], [place], [place] [address]    Report Status 07/06/2019 FINAL  Final  Culture, body fluid-bottle     Status: None   Collection Time: 07/05/19  4:15 PM   Specimen: Fluid  Result Value Ref Range Status   Specimen Description FLUID  Final   Special Requests NONE  Final   Culture   Final    NO GROWTH 5 DAYS Performed at [facility], [address], [place], [place] [address]  Report Status 07/10/2019 FINAL  Final  MRSA PCR Screening     Status: None   Collection Time: 07/06/19  3:30 AM   Specimen: Nasal Mucosa; Nasopharyngeal  Result Value Ref Range Status   MRSA by PCR NEGATIVE NEGATIVE Final    Comment:        The GeneXpert MRSA Assay (FDA approved for NASAL specimens only), is one component of a comprehensive MRSA colonization surveillance program. It is not intended to diagnose MRSA infection nor to guide or monitor treatment for MRSA infections. Performed at [facility], [address], [place], [place] [address]   Culture, blood (routine x 2)     Status: None (Preliminary result)   Collection Time: 07/10/19 10:00 AM   Specimen: BLOOD  Result Value Ref Range Status   Specimen Description   Final    BLOOD RIGHT ARM Performed at [facility], [address] [place], [place] [address]    Special Requests   Final    BOTTLES DRAWN AEROBIC AND ANAEROBIC Blood Culture adequate volume Performed at [facility], [address] [place], [place] [address]    Culture   Final    NO GROWTH 4 DAYS Performed at [facility], [address] [place], [place] [address]    Report Status PENDING  Incomplete  Culture, blood (routine x 2)     Status: None (Preliminary result)   Collection Time: 07/10/19 10:15 AM   Specimen: BLOOD  Result Value Ref Range Status   Specimen Description   Final    BLOOD RIGHT HAND Performed at [facility], [address], [place], [place] [address]    Special Requests   Final    BOTTLES DRAWN AEROBIC AND ANAEROBIC Blood Culture adequate volume Performed at [facility], [address] [place], [place] [address]    Culture   Final    NO GROWTH 4 DAYS Performed at [facility], [address], [place], [place] [address]    Report Status PENDING  Incomplete    Radiology Reports Dg Chest 1 View  Result Date: 06/26/2019 CLINICAL DATA:  Status post thoracentesis. EXAM: CHEST  1 VIEW COMPARISON:  06/24/2019 FINDINGS: Interval near complete evacuation of the left pleural fluid collection. A small residual effusion remains. No evidence of a postprocedural pneumothorax. Left basilar atelectasis is noted. The right lung is clear. No right-sided effusion. IMPRESSION: Interval near complete evacuation of the left pleural fluid collection with left lower lobe atelectasis. No postprocedural pneumothorax. Electronically Signed   By: Rudie Meyer M.D.   On: 06/26/2019 09:40    Dg Abd 1 View  Result Date: 06/21/2019 CLINICAL DATA:  Pt reports extreme abdominal pain across entire abdomen. Reports hx of pancreatitis. Pain with any movements. EXAM: ABDOMEN - 1 VIEW COMPARISON:  None. FINDINGS: The bowel gas pattern is normal. Cholecystectomy clips. No radio-opaque calculi or other significant radiographic abnormality are seen. IMPRESSION: Negative. Electronically Signed   By: Corlis Leak M.D.   On: 06/21/2019 11:04   Ct Chest W Contrast  Result Date: 07/10/2019 CLINICAL DATA:  Pleural effusion, abdominal pain, pancreatitis, increased WBC EXAM: CT CHEST, ABDOMEN, AND PELVIS WITH CONTRAST TECHNIQUE: Multidetector CT imaging of the chest, abdomen and pelvis was performed following the standard protocol during bolus administration of intravenous contrast. 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 Normal heart size. No pericardial effusion. Mediastinum/Nodes: No enlarged mediastinal, hilar, or axillary lymph nodes. Thyroid gland, trachea, and esophagus demonstrate no significant findings. Lungs/Pleura: Moderate left, small right pleural effusion with associated atelectasis or consolidation. Left pleural effusion is slightly decreased compared to prior examination. Musculoskeletal: No chest wall mass or suspicious bone lesions identified. CT ABDOMEN PELVIS FINDINGS Hepatobiliary: No solid liver abnormality is seen. Mildly coarse, nodular contour of the liver. Status post cholecystectomy. Pancreas: Unremarkable. No pancreatic ductal dilatation or surrounding inflammatory changes. Spleen: Normal in size without significant abnormality. Adrenals/Urinary Tract: Adrenal glands are unremarkable. Kidneys are normal,  without renal calculi, solid lesion, or hydronephrosis. There is redemonstrated soft tissue thickening in the posterior perirenal fat, likely related to inflammatory fat necrosis and unchanged from prior. Bladder is unremarkable. Stomach/Bowel: Stomach is within normal limits. Weighted enteric feeding tube is positioned with tip in the stomach. Appendix appears normal. There are multiple loops of contrast filled although not overtly distended proximal to mid small bowel. There is gas and fluid in the colon to the rectum. Vascular/Lymphatic: Aortic atherosclerosis. Prominent mesenteric lymph nodes and/or fluid in the central small bowel mesentery unchanged from prior (series 2, image 74). Reproductive: No mass or other abnormality. Other: No abdominal wall hernia or abnormality. Redemonstrated small volume ascites. The peritoneum generally appears slightly thickened and enhancing. There are multiple redemonstrated rim enhancing fluid collections about the abdomen. There is a new collection about the anterior left lobe of the liver and gastrohepatic ligament measuring 15.8 x 6.0 cm (series 2, image 58). There are multiple additional fluid collections which have generally decreased in size, most notably anterior to the pancreatic body and tail, measuring 12.0 x 3.3 cm, previously 14.9 x 4.3 cm when measured similarly (series 2, image 66) and within the splenogastric ligament measuring 5.8 x 1.9 cm, previously 7.9 x 4.5 cm when measured similarly (series 2, image 46). Musculoskeletal: No acute or significant osseous findings. IMPRESSION: 1. Moderate left, small right pleural effusion with associated atelectasis or consolidation. Left pleural effusion is slightly decreased compared to prior examination. 2. Redemonstrated small volume ascites. The peritoneum generally appears slightly thickened and enhancing. 3. There are multiple redemonstrated rim enhancing fluid collections about the abdomen. There is a new collection  about the anterior left lobe of the liver and gastrohepatic ligament measuring 15.8 x 6.0 cm (series 2, image 58). There are multiple additional fluid collections which have generally decreased in size, most notably anterior to the pancreatic body and tail, measuring 12.0 x 3.3 cm, previously 14.9 x 4.3 cm when measured similarly (series 2, image 66) and within the splenogastric ligament measuring 5.8 x 1.9 cm, previously 7.9 x 4.5 cm when measured similarly (series 2, image 46). Fluctuation of fluid collections may to some extent reflect redistribution of fluid within multiple communicating collections or alternately ongoing development of inflammatory fluid collections. The presence or absence of infection is not established. 4.  Other chronic and incidental findings as detailed above. Electronically Signed   By: Lauralyn Primes M.D.   On: 07/10/2019 16:36   Ct Chest W Contrast  Result Date: 06/25/2019 CLINICAL DATA:  Shortness of breath, acute on chronic pancreatitis with multiple peripancreatic fluid collections, recurrent epigastric pain EXAM: CT CHEST, ABDOMEN, AND PELVIS WITH CONTRAST TECHNIQUE: Multidetector CT imaging of the chest, abdomen and pelvis was performed following the standard protocol during bolus administration of intravenous  contrast. 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 Normal heart size. No pericardial effusion. Mediastinum/Nodes: No enlarged mediastinal, hilar, or axillary lymph nodes. Thyroid gland, trachea, and esophagus demonstrate no significant findings. Lungs/Pleura: There is a large left pleural effusion with associated atelectasis or consolidation, enlarged compared to prior examination. There is a new, trace right pleural effusion. There are scattered subpleural ground-glass pulmonary  opacities, most conspicuous in the right upper lobe. Musculoskeletal: No chest wall mass or suspicious bone lesions identified. CT ABDOMEN PELVIS FINDINGS Hepatobiliary: No focal liver abnormality is seen. Status post cholecystectomy. No biliary dilatation. Pancreas: Redemonstrated extensive retroperitoneal inflammation and multiple peripancreatic fluid collections. There is a new discrete fluid collection or component adjacent to the greater curvature of the stomach and spleen measuring approximately 6.2 x 2.7 cm (series 3, image 52). There is an additional new fluid collection within or adjacent to the omentum measuring 4.7 x 2.5 cm (series 3, image 75). There is an additional new fluid collection within the small bowel mesentery measuring 5.0 x 3.2 cm (series 3, image 86). Other fluid collections are not significantly changed, for example anterior to the pancreatic neck (series 3, image 67), adjacent to the porta hepatis (series 3, image 67), adjacent to the left kidney and splenic flexure (series 3, image 73), and posterior to the left kidney (series 3, image 65). Spleen: Splenomegaly, maximum span 14.4 cm. Adrenals/Urinary Tract: Adrenal glands are unremarkable. Kidneys are normal, without renal calculi, solid lesion, or hydronephrosis. Bladder is unremarkable. Stomach/Bowel: Stomach is within normal limits. Appendix appears normal. Inflammatory thickening of the transverse colon (series 3, image 81), similar to prior examination, and of the mid small bowel, particularly a segment in the anterior abdomen (series 3, image 95). Vascular/Lymphatic: Aortic atherosclerosis. No enlarged abdominal or pelvic lymph nodes. Reproductive: No mass or other abnormality. Other: Extensive anasarca. Small volume ascites, similar to prior examination. Musculoskeletal: No acute or significant osseous findings. IMPRESSION: 1. There is a large left pleural effusion with associated atelectasis or consolidation, enlarged compared to  prior examination. There is a new, trace right pleural effusion. 2. There are scattered subpleural ground-glass pulmonary opacities, most conspicuous in the right upper lobe. These are nonspecific and infectious or inflammatory. 3. Redemonstrated extensive retroperitoneal inflammation and multiple peripancreatic fluid collections. There is a new discrete fluid collection or component adjacent to the greater curvature of the stomach and spleen measuring approximately 6.2 x 2.7 cm (series 3, image 52). There is an additional new fluid collection within or adjacent to the omentum measuring 4.7 x 2.5 cm (series 3, image 75). There is an additional new fluid collection within the small bowel mesentery measuring 5.0 x 3.2 cm (series 3, image 86). 4. Other fluid collections are not significantly changed, for example anterior to the pancreatic neck (series 3, image 67), adjacent to the porta hepatis (series 3, image 67), adjacent to the left kidney and splenic flexure (series 3, image 73), and posterior to the left kidney (series 3, image 65). 5. Inflammatory thickening of the transverse colon (series 3, image 81), similar to prior examination, and of the mid small bowel, particularly a segment in the anterior abdomen (series 3, image 95). 6. Extensive anasarca. Small volume ascites, similar to prior examination. Electronically Signed   By: Lauralyn Primes M.D.   On: 06/25/2019 14:18   Ct Angio Chest Pe W/cm &/or Wo  Cm  Result Date: 07/05/2019 CLINICAL DATA:  Chest pain.  Complicated pancreatitis. EXAM: CT ANGIOGRAPHY CHEST WITH CONTRAST TECHNIQUE: Multidetector CT imaging of the chest was performed using the standard protocol during bolus administration of intravenous contrast. Multiplanar CT image reconstructions and MIPs were obtained to evaluate the vascular anatomy. CONTRAST:  OMNIPAQUE IOHEXOL 350 MG/ML SOLN COMPARISON:  Chest CT from 10 days ago FINDINGS: Cardiovascular: Satisfactory opacification of the  pulmonary arteries to the segmental level. No evidence of pulmonary embolism when accounting for intermittent motion artifact. Normal heart size. No pericardial effusion. Aberrant right subclavian artery Mediastinum/Nodes: Negative for adenopathy or mass. Lungs/Pleura: Large left pleural effusion that is layering. Trace right pleural fluid. Near complete collapse of the left lower lobe. No consolidation or edema. Upper Abdomen: As reported separately. Musculoskeletal: No acute or aggressive finding Review of the MIP images confirms the above findings. IMPRESSION: 1. Negative for pulmonary embolism. 2. Large layering left pleural effusion with near complete lower lobe collapse. Electronically Signed   By: Marnee Spring M.D.   On: 07/05/2019 07:16   Ct Abdomen Pelvis W Contrast  Result Date: 07/10/2019 CLINICAL DATA:  Pleural effusion, abdominal pain, pancreatitis, increased WBC EXAM: CT CHEST, ABDOMEN, AND PELVIS WITH CONTRAST TECHNIQUE: Multidetector CT imaging of the chest, abdomen and pelvis was performed following the standard protocol during bolus administration of intravenous contrast. 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 Normal heart size. No pericardial effusion. Mediastinum/Nodes: No enlarged mediastinal, hilar, or axillary lymph nodes. Thyroid gland, trachea, and esophagus demonstrate no significant findings. Lungs/Pleura: Moderate left, small right pleural effusion with associated atelectasis or consolidation. Left pleural effusion is slightly decreased compared to prior examination. Musculoskeletal: No chest wall mass or suspicious bone lesions identified. CT ABDOMEN PELVIS FINDINGS Hepatobiliary: No solid liver  abnormality is seen. Mildly coarse, nodular contour of the liver. Status post cholecystectomy. Pancreas: Unremarkable. No pancreatic ductal dilatation or surrounding inflammatory changes. Spleen: Normal in size without significant abnormality. Adrenals/Urinary Tract: Adrenal glands are unremarkable. Kidneys are normal, without renal calculi, solid lesion, or hydronephrosis. There is redemonstrated soft tissue thickening in the posterior perirenal fat, likely related to inflammatory fat necrosis and unchanged from prior. Bladder is unremarkable. Stomach/Bowel: Stomach is within normal limits. Weighted enteric feeding tube is positioned with tip in the stomach. Appendix appears normal. There are multiple loops of contrast filled although not overtly distended proximal to mid small bowel. There is gas and fluid in the colon to the rectum. Vascular/Lymphatic: Aortic atherosclerosis. Prominent mesenteric lymph nodes and/or fluid in the central small bowel mesentery unchanged from prior (series 2, image 74). Reproductive: No mass or other abnormality. Other: No abdominal wall hernia or abnormality. Redemonstrated small volume ascites. The peritoneum generally appears slightly thickened and enhancing. There are multiple redemonstrated rim enhancing fluid collections about the abdomen. There is a new collection about the anterior left lobe of the liver and gastrohepatic ligament measuring 15.8 x 6.0 cm (series 2, image 58). There are multiple additional fluid collections which have generally decreased in size, most notably anterior to the pancreatic body and tail, measuring 12.0 x 3.3 cm, previously 14.9 x 4.3 cm when measured similarly (series 2, image 66) and within the splenogastric ligament measuring 5.8 x 1.9 cm, previously 7.9 x 4.5 cm when measured similarly (  series 2, image 46). Musculoskeletal: No acute or significant osseous findings. IMPRESSION: 1. Moderate left, small right pleural effusion with associated  atelectasis or consolidation. Left pleural effusion is slightly decreased compared to prior examination. 2. Redemonstrated small volume ascites. The peritoneum generally appears slightly thickened and enhancing. 3. There are multiple redemonstrated rim enhancing fluid collections about the abdomen. There is a new collection about the anterior left lobe of the liver and gastrohepatic ligament measuring 15.8 x 6.0 cm (series 2, image 58). There are multiple additional fluid collections which have generally decreased in size, most notably anterior to the pancreatic body and tail, measuring 12.0 x 3.3 cm, previously 14.9 x 4.3 cm when measured similarly (series 2, image 66) and within the splenogastric ligament measuring 5.8 x 1.9 cm, previously 7.9 x 4.5 cm when measured similarly (series 2, image 46). Fluctuation of fluid collections may to some extent reflect redistribution of fluid within multiple communicating collections or alternately ongoing development of inflammatory fluid collections. The presence or absence of infection is not established. 4.  Other chronic and incidental findings as detailed above. Electronically Signed   By: Lauralyn Primes M.D.   On: 07/10/2019 16:36   Ct Abdomen Pelvis W Contrast  Result Date: 06/25/2019 CLINICAL DATA:  Shortness of breath, acute on chronic pancreatitis with multiple peripancreatic fluid collections, recurrent epigastric pain EXAM: CT CHEST, ABDOMEN, AND PELVIS WITH CONTRAST TECHNIQUE: Multidetector CT imaging of the chest, abdomen and pelvis was performed following the standard protocol during bolus administration of intravenous contrast. 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 Normal heart size. No pericardial effusion. Mediastinum/Nodes: No enlarged mediastinal, hilar, or  axillary lymph nodes. Thyroid gland, trachea, and esophagus demonstrate no significant findings. Lungs/Pleura: There is a large left pleural effusion with associated atelectasis or consolidation, enlarged compared to prior examination. There is a new, trace right pleural effusion. There are scattered subpleural ground-glass pulmonary opacities, most conspicuous in the right upper lobe. Musculoskeletal: No chest wall mass or suspicious bone lesions identified. CT ABDOMEN PELVIS FINDINGS Hepatobiliary: No focal liver abnormality is seen. Status post cholecystectomy. No biliary dilatation. Pancreas: Redemonstrated extensive retroperitoneal inflammation and multiple peripancreatic fluid collections. There is a new discrete fluid collection or component adjacent to the greater curvature of the stomach and spleen measuring approximately 6.2 x 2.7 cm (series 3, image 52). There is an additional new fluid collection within or adjacent to the omentum measuring 4.7 x 2.5 cm (series 3, image 75). There is an additional new fluid collection within the small bowel mesentery measuring 5.0 x 3.2 cm (series 3, image 86). Other fluid collections are not significantly changed, for example anterior to the pancreatic neck (series 3, image 67), adjacent to the porta hepatis (series 3, image 67), adjacent to the left kidney and splenic flexure (series 3, image 73), and posterior to the left kidney (series 3, image 65). Spleen: Splenomegaly, maximum span 14.4 cm. Adrenals/Urinary Tract: Adrenal glands are unremarkable. Kidneys are normal, without renal calculi, solid lesion, or hydronephrosis. Bladder is unremarkable. Stomach/Bowel: Stomach is within normal limits. Appendix appears normal. Inflammatory thickening of the transverse colon (series 3, image 81), similar to prior examination, and of the mid small bowel, particularly a segment in the anterior abdomen (series 3, image 95). Vascular/Lymphatic: Aortic atherosclerosis. No enlarged  abdominal or pelvic lymph nodes. Reproductive: No mass or other abnormality. Other: Extensive anasarca.  Small volume ascites, similar to prior examination. Musculoskeletal: No acute or significant osseous findings. IMPRESSION: 1. There is a large left pleural effusion with associated atelectasis or consolidation, enlarged compared to prior examination. There is a new, trace right pleural effusion. 2. There are scattered subpleural ground-glass pulmonary opacities, most conspicuous in the right upper lobe. These are nonspecific and infectious or inflammatory. 3. Redemonstrated extensive retroperitoneal inflammation and multiple peripancreatic fluid collections. There is a new discrete fluid collection or component adjacent to the greater curvature of the stomach and spleen measuring approximately 6.2 x 2.7 cm (series 3, image 52). There is an additional new fluid collection within or adjacent to the omentum measuring 4.7 x 2.5 cm (series 3, image 75). There is an additional new fluid collection within the small bowel mesentery measuring 5.0 x 3.2 cm (series 3, image 86). 4. Other fluid collections are not significantly changed, for example anterior to the pancreatic neck (series 3, image 67), adjacent to the porta hepatis (series 3, image 67), adjacent to the left kidney and splenic flexure (series 3, image 73), and posterior to the left kidney (series 3, image 65). 5. Inflammatory thickening of the transverse colon (series 3, image 81), similar to prior examination, and of the mid small bowel, particularly a segment in the anterior abdomen (series 3, image 95). 6. Extensive anasarca. Small volume ascites, similar to prior examination. Electronically Signed   By: Lauralyn Primes M.D.   On: 06/25/2019 14:18   Ct Abdomen Pelvis W Contrast  Result Date: 06/20/2019 CLINICAL DATA:  Abdominal pain.  Neutropenia. EXAM: CT ABDOMEN AND PELVIS WITH CONTRAST TECHNIQUE: Multidetector CT imaging of the abdomen and pelvis was  performed using the standard protocol following bolus administration of intravenous contrast. CONTRAST:  OMNIPAQUE IOHEXOL 300 MG/ML  SOLN COMPARISON:  None. FINDINGS: Lower chest: There is a moderate-sized left-sided pleural effusion with near complete collapse of the left lower lobe.The heart size is normal. Hepatobiliary: The liver is normal. Status post cholecystectomy.There is no biliary ductal dilation. Pancreas: There are multiple peripancreatic fluid collections the largest of which measures approximately 3.7 by 1.8 cm. The pancreas appears to enhance symmetrically. Spleen: The spleen is enlarged measuring approximately 14 cm craniocaudad. Adrenals/Urinary Tract: --Adrenal glands: No adrenal hemorrhage. --Right kidney/ureter: No hydronephrosis or perinephric hematoma. --Left kidney/ureter: There is no left-sided hydronephrosis. There is a complex collection in the left posterior pararenal space measuring approximately 8.5 x 2.5 cm. --Urinary bladder: Unremarkable. Stomach/Bowel: --Stomach/Duodenum: There is some wall thickening of the stomach. --Small bowel: No dilatation or inflammation. --Colon: There are soft tissue densities along the descending colon measuring approximately 2.8 x 2.4 cm. There is a collection of the splenic flexure measuring 4.7 x 2.7 cm causing mass effect on the nearby:Marland Kitchen There is wall thickening of the transverse colon without evidence of an obstruction. --Appendix: Not visualized. No right lower quadrant inflammation or free fluid. Vascular/Lymphatic: Atherosclerotic changes are noted of the abdominal aorta without evidence of an abdominal aortic aneurysm. The portal vein and splenic vein remain patent. The splenic artery remains patent. --No retroperitoneal lymphadenopathy. --No mesenteric lymphadenopathy. --No pelvic or inguinal lymphadenopathy. Reproductive: Unremarkable Other: There is a small volume of free fluid in the pelvis. There are multiple scattered collections in  the retroperitoneum and peritoneal cavity. A few these collections demonstrate mild rim enhancement. For example in the left upper quadrant there is a 3.9 x 1.9 cm collection that demonstrates mild peripheral rim enhancement. There is a 3.8 by 4.4 cm collection in the  region of the gallbladder fossa. Given the surgical clips in the gallbladder fossa this is favored to represent a loculated fluid collection as opposed to a remnant gallbladder. Musculoskeletal. No acute displaced fractures. IMPRESSION: 1. Overall findings concerning for pancreatitis with multiple loculated fluid collections as detailed above. Some of the smaller collections, for example in the left upper quadrant, demonstrate rim enhancement. An abscess is not excluded. There is no CT evidence for pancreatic necrosis. 2. Moderate-sized left-sided pleural effusion with at least partial collapse of the left lower lobe. 3. Diffuse wall thickening of the transverse colon and splenic flexure favored to be reactive. Other considerations include infectious or inflammatory colitis. 4. Splenomegaly.  The splenic vein remains patent. Electronically Signed   By: Katherine Mantle M.D.   On: 06/20/2019 21:07   Dg Chest Port 1 View  Result Date: 07/12/2019 CLINICAL DATA:  Pt reported right sided chest wall pain. Medical hx of enlarging pancreatic pseudocysts and one that is larger and newer in upper abdomen exerting mass effect on anterior aspect of left lobe of liver. EXAM: PORTABLE CHEST 1 VIEW COMPARISON:  07/10/2019 FINDINGS: Shallow lung inflation. Bilateral pleural effusions and bibasilar opacities appear stable. Feeding tube has been withdrawn, tip now within the level of the mid esophagus. IMPRESSION: Feeding tube tip within the mid esophagus. These results will be called to the ordering clinician or representative by the Radiologist Assistant, and communication documented in the PACS or zVision Dashboard. Electronically Signed   By: Norva Pavlov  M.D.   On: 07/12/2019 14:07   Dg Chest Port 1 View  Result Date: 07/10/2019 CLINICAL DATA:  Leukocytosis EXAM: PORTABLE CHEST 1 VIEW COMPARISON:  None. FINDINGS: Feeding tube tip is in the distal esophagus. There is a small left pleural effusion with patchy consolidation in the left base. There is atelectatic change in each lung base as well. Heart is upper normal in size with pulmonary vascularity normal. No adenopathy. No bone lesions. IMPRESSION: Feeding tube tip in distal esophagus. Persistent patchy consolidation left base with bibasilar atelectasis. Small left pleural effusion. Stable cardiac silhouette. These results will be called to the ordering clinician or representative by the Radiologist Assistant, and communication documented in the PACS or zVision Dashboard. Electronically Signed   By: Bretta Bang III M.D.   On: 07/10/2019 08:16   Dg Chest Port 1 View  Result Date: 07/05/2019 CLINICAL DATA:  Status post left-sided thoracentesis EXAM: PORTABLE CHEST 1 VIEW COMPARISON:  July 05, 2019 FINDINGS: The left-sided pleural effusion has significantly decreased in size from the prior study. There is no evidence for left-sided pneumothorax. Bibasilar airspace opacities are noted, left worse than right. The lung volumes are low. The heart size is stable from prior study. IMPRESSION: 1. No pneumothorax status post left-sided thoracentesis. 2. Significant interval decrease in size of the left-sided pleural effusion. 3. Bibasilar airspace opacities, left greater than right, favored to represent atelectasis. 4. Low lung volumes. Electronically Signed   By: Katherine Mantle M.D.   On: 07/05/2019 16:31   Dg Chest Portable 1 View  Result Date: 07/05/2019 CLINICAL DATA:  Chest pain. EXAM: PORTABLE CHEST 1 VIEW COMPARISON:  Radiograph 06/26/2019, CT 06/25/2019 FINDINGS: Re-accumulation of left pleural effusion after thoracentesis. Pleural effusion is moderate in size with associated underlying left  lung base atelectasis/consolidation. Possible small right pleural effusion. Unchanged heart size and mediastinal contours. Increasing patchy right lung base opacity. No pneumothorax. IMPRESSION: 1. Re-accumulation of left pleural effusion after thoracentesis, now moderate in size. Associated left lung  base atelectasis/consolidation. 2. Possible small right pleural effusion. Increasing right lung base opacity may be atelectasis or pneumonia. Electronically Signed   By: Narda Rutherford M.D.   On: 07/05/2019 03:22   Dg Chest Port 1 View  Result Date: 06/24/2019 CLINICAL DATA:  Chest pain for 1 hour EXAM: PORTABLE CHEST 1 VIEW COMPARISON:  06/20/2019 FINDINGS: Left-sided pleural effusion is again seen and slightly enlarged when compare with the prior study. Likely underlying atelectasis/infiltrate is present in the left base. The right lung is clear. No bony abnormality is noted. IMPRESSION: Increasing left-sided pleural effusion. Electronically Signed   By: Alcide Clever M.D.   On: 06/24/2019 19:33   Dg Chest Portable 1 View  Result Date: 06/20/2019 CLINICAL DATA:  Chest pain EXAM: PORTABLE CHEST 1 VIEW COMPARISON:  None. FINDINGS: There is airspace consolidation in the left base with left pleural effusion. Lungs elsewhere are clear. Heart size and pulmonary vascularity are normal. No adenopathy. No bone lesions. IMPRESSION: Left lower lobe airspace consolidation consistent with pneumonia. Small left pleural effusion. Lungs elsewhere clear. No adenopathy. Heart size normal. Followup PA and lateral chest radiographs recommended in 3-4 weeks following trial of antibiotic therapy to ensure resolution and exclude underlying malignancy. Electronically Signed   By: Bretta Bang III M.D.   On: 06/20/2019 19:32   Dg Abd Portable 1v  Result Date: 07/14/2019 CLINICAL DATA:  NG tube placement. EXAM: PORTABLE ABDOMEN - 1 VIEW 11:55 a.m. COMPARISON:  07/14/2019 at 3:19 a.m. FINDINGS: The feeding tube tip is in the  midbody of the stomach, essentially unchanged since the prior study. The visualized bowel gas pattern is normal. Small left pleural effusion is noted with slight bibasilar atelectasis. IMPRESSION: Feeding tube tip in the mid body of the stomach, unchanged. Electronically Signed   By: Francene Boyers M.D.   On: 07/14/2019 12:51   Dg Abd Portable 1v  Result Date: 07/14/2019 CLINICAL DATA:  Nasogastric tube placement EXAM: PORTABLE ABDOMEN - 1 VIEW COMPARISON:  None. FINDINGS: The weighted tip of the enteric tube projects over the stomach. There is persistent small bowel dilatation. IMPRESSION: Weighted enteric tube tip projects over the stomach. Electronically Signed   By: Deatra Robinson M.D.   On: 07/14/2019 03:46   Dg Abd Portable 1v  Result Date: 07/11/2019 CLINICAL DATA:  Per RN-patient called that he moved to use the bathroom and he displaced the core track feeding tube from 70 mark to 35. I advanced it back but he is c/o pain and says he feels its is displaced." EXAM: PORTABLE ABDOMEN - 1 VIEW COMPARISON:  Radiograph yesterday at 10:51 a.m. FINDINGS: Tip of the weighted enteric tube is in the left upper quadrant below the diaphragm, in the pre pyloric stomach. Surgical clips in the right abdomen. IMPRESSION: Tip of the weighted enteric tube below the diaphragm in the pre pyloric stomach. Electronically Signed   By: Narda Rutherford M.D.   On: 07/11/2019 00:45   Dg Abd Portable 1v  Result Date: 07/10/2019 CLINICAL DATA:  Check feeding catheter placement EXAM: PORTABLE ABDOMEN - 1 VIEW COMPARISON:  07/09/2019 FINDINGS: Weighted feeding catheter is noted with the tip in the distal stomach. Mildly dilated small bowel is noted similar to that seen on the prior exam. IMPRESSION: Feeding catheter within the distal stomach. Electronically Signed   By: Alcide Clever M.D.   On: 07/10/2019 11:38   Dg Abd Portable 1v  Result Date: 07/09/2019 CLINICAL DATA:  Feeding tube placement EXAM: PORTABLE ABDOMEN - 1 VIEW  COMPARISON:  07/05/2019 FINDINGS: Bibasilar pleural effusions and airspace disease. Esophageal tube tip overlies the proximal stomach. Dilated small bowel in the central abdomen. IMPRESSION: Esophageal tube tip overlies the proximal stomach. Electronically Signed   By: Jasmine Pang M.D.   On: 07/09/2019 19:32   Ct Renal Stone Study  Result Date: 07/05/2019 CLINICAL DATA:  Chest pain. EXAM: CT ABDOMEN AND PELVIS WITHOUT CONTRAST TECHNIQUE: Multidetector CT imaging of the abdomen and pelvis was performed following the standard protocol without IV contrast. COMPARISON:  Fifteen days ago FINDINGS: Lower chest: At least moderate left pleural effusion with multi segment atelectasis. Pending chest CT. Hepatobiliary: No focal liver abnormality.Cholecystectomy. Pancreas: History of recurrent pancreatitis. Progressive organized collections: 1. 8 x 4 cm along the upper greater curvature of the stomach 2. 6 cm along the mid greater curvature of the stomach-which may be connected to #1. 3. Peripancreatic to splenic tail, 15 x 4 by up to 6 cm-with further extent tracking inferiorly along the descending colon. 4. Ligamentum venosum of the liver measuring 5 x 3 cm 5. Ventral midline abdomen at 5 cm 6. Lower ventral midline abdomen with interloop distortion, 9 cm. These collections exert mass effect on the stomach, transverse colon, and descending colon. Fat density with rim of stranding posterior to the left kidney in the posterior pararenal space, stable in compatible with fat necrosis. There is generalized retroperitoneal edema about the collections and pancreas. No gas within any of the collections to suggest fistula or definite superinfection. Spleen: Stable size and density. Adrenals/Urinary Tract: Negative adrenals. No hydronephrosis or stone. Unremarkable bladder. Stomach/Bowel: No obstruction. Low-density about the hepatic flexure is likely a combination of reactive wall thickening and pericolonic fluid.  Vascular/Lymphatic: No visible acute vascular abnormality. No noted adenopathy. Reproductive:Negative Other: Right upper quadrant ascites which may be loculated. Musculoskeletal: No acute abnormalities. IMPRESSION: 1. Recent pancreatitis with multiple organized fluid collections in the abdomen as listed and measured above. The largest measures 15 x 4 x 6 cm. Collections exert mass effect on the stomach, transverse colon, descending colon without obstruction. 2. Stable left posterior pararenal fat necrosis. 3. At least moderate left pleural effusion with multi segment atelectasis. Electronically Signed   By: Marnee Spring M.D.   On: 07/05/2019 07:12   Ir Thoracentesis Asp Pleural Space W/img Guide  Result Date: 06/26/2019 INDICATION: Shortness of breath. Left-sided pleural effusion. Request for diagnostic and therapeutic thoracentesis. EXAM: ULTRASOUND GUIDED LEFT THORACENTESIS MEDICATIONS: None. COMPLICATIONS: None immediate. PROCEDURE: An ultrasound guided thoracentesis was thoroughly discussed with the patient and questions answered. The benefits, risks, alternatives and complications were also discussed. The patient understands and wishes to proceed with the procedure. Written consent was obtained. Ultrasound was performed to localize and mark an adequate pocket of fluid in the left chest. The area was then prepped and draped in the normal sterile fashion. 1% Lidocaine was used for local anesthesia. Under ultrasound guidance a 6 Fr Safe-T-Centesis catheter was introduced. Thoracentesis was performed. The catheter was removed and a dressing applied. FINDINGS: A total of approximately 1.3 L of hazy yellow fluid was removed. Samples were sent to the laboratory as requested by the clinical team. IMPRESSION: Successful ultrasound guided left thoracentesis yielding 1.3 L of pleural fluid. Read by: Brayton El PA-C Electronically Signed   By: Richarda Overlie M.D.   On: 06/26/2019 09:19   US Thoracentesis Asp Pleural  Space W/img Guide  Result Date: 07/05/2019 INDICATION: Patient with history of recurrent pancreatitis, GERD, dyspnea, recurrent left pleural effusion. Request made for  diagnostic and therapeutic left thoracentesis. EXAM: ULTRASOUND GUIDED DIAGNOSTIC AND THERAPEUTIC LEFT THORACENTESIS MEDICATIONS: None COMPLICATIONS: None immediate. PROCEDURE: An ultrasound guided thoracentesis was thoroughly discussed with the patient and questions answered. The benefits, risks, alternatives and complications were also discussed. The patient understands and wishes to proceed with the procedure. Written consent was obtained. Ultrasound was performed to localize and mark an adequate pocket of fluid in the left chest. The area was then prepped and draped in the normal sterile fashion. 1% Lidocaine was used for local anesthesia. Under ultrasound guidance a 6 Fr Safe-T-Centesis catheter was introduced. Thoracentesis was performed. The catheter was removed and a dressing applied. FINDINGS: A total of approximately 770 cc of yellow fluid was removed. Samples were sent to the laboratory as requested by the clinical team. IMPRESSION: Successful ultrasound guided diagnostic and therapeutic left thoracentesis yielding 770 cc of pleural fluid. Read by: Jeananne Rama, PA-C Electronically Signed   By: Richarda Overlie M.D.   On: 07/05/2019 16:12    Time Spent in minutes 35   Marcanthony Sleight M.D on 07/14/2019 at 2:17 PM  Between 7am to 7pm - Pager - [phone]  After 7pm go to [email or web address] - password [facility]  [facility] -  Office  [phone]

## 2019-07-14 NOTE — Progress Notes (Signed)
Patient c/o sudden sharp right back/side pain 7/10.  States the Toradol has helped during the night.  Dr. Clementeen Graham notified.

## 2019-07-15 LAB — GLUCOSE, CAPILLARY
Glucose-Capillary: 101 mg/dL — ABNORMAL HIGH (ref 70–99)
Glucose-Capillary: 104 mg/dL — ABNORMAL HIGH (ref 70–99)
Glucose-Capillary: 108 mg/dL — ABNORMAL HIGH (ref 70–99)

## 2019-07-15 LAB — CULTURE, BLOOD (ROUTINE X 2)
Culture: NO GROWTH
Culture: NO GROWTH
Special Requests: ADEQUATE
Special Requests: ADEQUATE

## 2019-07-15 NOTE — Progress Notes (Signed)
Peg/cortrack tube flushed. [facility] flushed and saline locked IV line for transport. Pt belongings sent with patient.

## 2019-07-15 NOTE — Progress Notes (Signed)
   07/15/19 0743  MEWS Score  Resp (!) 22  Pulse Rate (!) 111  BP 112/65  Temp 99.1 F (37.3 C)  SpO2 99 %  O2 Device Tracheostomy Collar  O2 Flow Rate (L/min) 2 L/min  MEWS Score  MEWS RR 1  MEWS Pulse 2  MEWS Systolic 0  MEWS LOC 0  MEWS Temp 0  MEWS Score 3  MEWS Score Color Yellow   Pts pulse is slightly elevated due to pt walking to the bed and settling down in the bed. Pt denies any pain at this time with no s/s of distress noted. Will recheck pulse to ensure pt safety. Will continue to monitor

## 2019-07-15 NOTE — Discharge Summary (Signed)
Physician Discharge Summary  Rashard Shutt [id number] DOB: Jul 11, 1971 DOA: 07/05/2019  PCP: Patient, No Pcp Per  Admit date: 07/05/2019 Discharge date: 07/15/2019  Accepting physician: Dr. Peggye Form   Admitted From: Home Disposition: [facility]  Recommendations for Outpatient Follow-up:  Follow-up with GI and surgery at [facility].   Equipment/Devices: 2 L O2 via nasal cannula  Discharge Condition: Guarded CODE STATUS: Full code Diet recommendation: Enteral feeding    Discharge Diagnoses:  Principal Problem:   Idiopathic acute pancreatitis with uninfected necrosis  Active Problems:   Recurrent left pleural effusion   GERD (gastroesophageal reflux disease)   IBS (irritable bowel syndrome)   Pancreatitis, recurrent   Acute on chronic pancreatitis ([place])   Tobacco abuse   Alcohol dependence, binge pattern ([place])   History of cholecystectomy   Protein-calorie malnutrition, severe ([place])   Infected pseudocyst of pancreas   Brief narrative/HPI  48 year old male with recurrent pancreatitis with developing pseudocyst, IBS and GERD who was followed at [facility] for his idiopathic pancreatitis and pancreatic cyst presented with recurrent epigastric pain suspicious for acute on chronic pancreatitis.  He was last hospitalized at [facility] on 06/02/2019 with necrotizing pancreatitis.  CT angiogram of the chest was negative for PE but showed large layering left pleural effusion with near complete lower lobe collapse.  IR consulted and underwent thoracentesis of the left pleural effusion.  Surgery consulted for evaluation of pseudocyst and recommended to wait for 6 weeks for the pseudocyst to mature and either follow-up with Dr. gross or at [facility] for further evaluation. Given his persistent pain he has been requiring IV morphine PCA. Hospital course prolonged due to persistent abdominal pain requiring morphine PCA and development of new  likely infected pseudocyst  Hospital course  Principal Problem:   Idiopathic acute pancreatitis with infected pseudocyst On morphine PCA and empiric IV antibiotics. GI and surgery following closely.  Surgery recommends ideally the pseudocyst will need to measure for at least 6 weeks to attempt cystoscopy gastrostomy.   Has very poor nutrition with low albumin.  Core track placed and enteral nutrition started.  Now has  right mid back pain possibly due to the new pseudocyst. CT of the abdomen repeated on 8/21 showing multiple fluid collection with a new collection near the anterior left lobe of liver and gastrohepatic ligament measuring 5.8 x 6 cm concern for infection given progressive leukocytosis. IR consulted to evaluate for percutaneous drainage but patient refused wanting to be transferred to [facility]. wbc still high (but trending down). afebrile. continue meropenem.   Active Problems:   Hyponatremia Possibly prerenal with dehydration.  Improved with IV fluids.  Severe protein calorie malnutrition ([place]) Coretrak placed and feeding started.  Dietitian following.  Tobacco abuse Counseled on cessation  Alcohol abuse No signs of withdrawal.     Leukocytosis and thrombocytosis Secondary to infected cyst.  Exudative pleural effusion, left  thoracentesis done on 8/17 with improvement noted on follow up CT from 8/21. On empiric meropenem.  Anemia nutritional def No active bleeding Drop is due to dilution   Family Communication  : None  Disposition Plan  : [facility]  \  Consults  : GI/surgery  Procedures  : CT abdomen pelvis, left thoracentesis   Discharge Instructions   Allergies as of 07/15/2019      Reactions   Codeine Rash   Other Other (See Comments)   "prolixa, an antidepressant from the 80s"   Fluphenazine Hcl Rash      Medication  List    STOP taking these medications   amoxicillin-clavulanate 875-125 MG tablet Commonly known as:  Augmentin   multivitamin with minerals Tabs tablet   Oxycodone HCl 10 MG Tabs   potassium chloride 10 MEQ tablet Commonly known as: K-DUR     TAKE these medications   acetaminophen 650 MG CR tablet Commonly known as: TYLENOL Take 325 mg by mouth every 8 (eight) hours as needed for pain.   Dexilant 60 MG capsule Generic drug: dexlansoprazole Take 60 mg by mouth daily.   dicyclomine 10 MG capsule Commonly known as: BENTYL Take 10 mg by mouth 3 (three) times daily.   feeding supplement (OSMOLITE 1.5 CAL) Liqd Place 1,000 mLs into feeding tube continuous.   feeding supplement (PRO-STAT SUGAR FREE 64) Liqd Place 30 mLs into feeding tube 2 (two) times daily.   furosemide 40 MG tablet Commonly known as: Lasix Take 1 tablet (40 mg total) by mouth daily for 5 days.   meropenem 1 g in sodium chloride 0.9 % 100 mL Inject 1 g into the vein every 8 (eight) hours.   metoprolol tartrate 25 MG tablet Commonly known as: LOPRESSOR Take 1 tablet (25 mg total) by mouth 2 (two) times daily.   morphine 2 mg/mL injection Commonly known as: MORPHINE Inject 0.5 mLs (1 mg total) into the vein every 4 (four) hours.   multivitamin Liqd Place 15 mLs into feeding tube daily.   ondansetron 4 MG tablet Commonly known as: Zofran Take 1 tablet (4 mg total) by mouth daily as needed for nausea or vomiting.   polyethylene glycol 17 g packet Commonly known as: MIRALAX / GLYCOLAX Take 17 g by mouth 2 (two) times daily.   psyllium 0.52 g capsule Commonly known as: REGULOID Take 0.52 g by mouth daily.      Follow-up [facility] Follow up.          Allergies  Allergen Reactions  . Codeine Rash  . Other Other (See Comments)    "prolixa, an antidepressant from the 80s"  . Fluphenazine Hcl Rash     \   Procedures/Studies: Dg Chest 1 View  Result Date: 06/26/2019 CLINICAL DATA:  Status post thoracentesis. EXAM: CHEST  1 VIEW COMPARISON:  06/24/2019  FINDINGS: Interval near complete evacuation of the left pleural fluid collection. A small residual effusion remains. No evidence of a postprocedural pneumothorax. Left basilar atelectasis is noted. The right lung is clear. No right-sided effusion. IMPRESSION: Interval near complete evacuation of the left pleural fluid collection with left lower lobe atelectasis. No postprocedural pneumothorax. Electronically Signed   By: Marijo Sanes M.D.   On: 06/26/2019 09:40   Dg Abd 1 View  Result Date: 06/21/2019 CLINICAL DATA:  Pt reports extreme abdominal pain across entire abdomen. Reports hx of pancreatitis. Pain with any movements. EXAM: ABDOMEN - 1 VIEW COMPARISON:  None. FINDINGS: The bowel gas pattern is normal. Cholecystectomy clips. No radio-opaque calculi or other significant radiographic abnormality are seen. IMPRESSION: Negative. Electronically Signed   By: Lucrezia Europe M.D.   On: 06/21/2019 11:04   Ct Chest W Contrast  Result Date: 07/10/2019 CLINICAL DATA:  Pleural effusion, abdominal pain, pancreatitis, increased WBC EXAM: CT CHEST, ABDOMEN, AND PELVIS WITH CONTRAST TECHNIQUE: Multidetector CT imaging of the chest, abdomen and pelvis was performed following the standard protocol during bolus administration of intravenous contrast. 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 Normal heart size. No pericardial effusion. Mediastinum/Nodes: No enlarged mediastinal, hilar, or axillary lymph nodes. Thyroid gland, trachea, and esophagus demonstrate no significant findings. Lungs/Pleura: Moderate left, small right pleural effusion with associated atelectasis or consolidation. Left pleural effusion is slightly decreased compared to prior  examination. Musculoskeletal: No chest wall mass or suspicious bone lesions identified. CT ABDOMEN PELVIS FINDINGS Hepatobiliary: No solid liver abnormality is seen. Mildly coarse, nodular contour of the liver. Status post cholecystectomy. Pancreas: Unremarkable. No pancreatic ductal dilatation or surrounding inflammatory changes. Spleen: Normal in size without significant abnormality. Adrenals/Urinary Tract: Adrenal glands are unremarkable. Kidneys are normal, without renal calculi, solid lesion, or hydronephrosis. There is redemonstrated soft tissue thickening in the posterior perirenal fat, likely related to inflammatory fat necrosis and unchanged from prior. Bladder is unremarkable. Stomach/Bowel: Stomach is within normal limits. Weighted enteric feeding tube is positioned with tip in the stomach. Appendix appears normal. There are multiple loops of contrast filled although not overtly distended proximal to mid small bowel. There is gas and fluid in the colon to the rectum. Vascular/Lymphatic: Aortic atherosclerosis. Prominent mesenteric lymph nodes and/or fluid in the central small bowel mesentery unchanged from prior (series 2, image 74). Reproductive: No mass or other abnormality. Other: No abdominal wall hernia or abnormality. Redemonstrated small volume ascites. The peritoneum generally appears slightly thickened and enhancing. There are multiple redemonstrated rim enhancing fluid collections about the abdomen. There is a new collection about the anterior left lobe of the liver and gastrohepatic ligament measuring 15.8 x 6.0 cm (series 2, image 58). There are multiple additional fluid collections which have generally decreased in size, most notably anterior to the pancreatic body and tail, measuring 12.0 x 3.3 cm, previously 14.9 x 4.3 cm when measured similarly (series 2, image 66) and within the splenogastric ligament measuring 5.8 x 1.9 cm, previously 7.9 x 4.5 cm when measured similarly (series 2, image  46). Musculoskeletal: No acute or significant osseous findings. IMPRESSION: 1. Moderate left, small right pleural effusion with associated atelectasis or consolidation. Left pleural effusion is slightly decreased compared to prior examination. 2. Redemonstrated small volume ascites. The peritoneum generally appears slightly thickened and enhancing. 3. There are multiple redemonstrated rim enhancing fluid collections about the abdomen. There is a new collection about the anterior left lobe of the liver and gastrohepatic ligament measuring 15.8 x 6.0 cm (series 2, image 58). There are multiple additional fluid collections which have generally decreased in size, most notably anterior to the pancreatic body and tail, measuring 12.0 x 3.3 cm, previously 14.9 x 4.3 cm when measured similarly (series 2, image 66) and within the splenogastric ligament measuring 5.8 x 1.9 cm, previously 7.9 x 4.5 cm when measured similarly (series 2, image 46). Fluctuation of fluid collections may to some extent reflect redistribution of fluid within multiple communicating collections or alternately ongoing development of inflammatory fluid collections. The presence or absence of infection is not established. 4.  Other chronic and incidental findings as detailed above. Electronically Signed   By: Eddie Candle M.D.   On: 07/10/2019 16:36   Ct Chest W Contrast  Result Date: 06/25/2019 CLINICAL DATA:  Shortness of breath, acute on chronic pancreatitis with multiple peripancreatic fluid collections, recurrent epigastric pain EXAM: CT CHEST, ABDOMEN, AND PELVIS WITH CONTRAST TECHNIQUE: Multidetector CT imaging of the chest, abdomen and pelvis was performed following the standard  protocol during bolus administration of intravenous contrast. CONTRAST:  117mL OMNIPAQUE IOHEXOL 300 MG/ML SOLN, additional oral enteric contrast COMPARISON:  06/20/2019 FINDINGS: CT CHEST FINDINGS Cardiovascular: Incidental note of aberrant retroesophageal origin of  the right subclavian artery. Normal heart size. No pericardial effusion. Mediastinum/Nodes: No enlarged mediastinal, hilar, or axillary lymph nodes. Thyroid gland, trachea, and esophagus demonstrate no significant findings. Lungs/Pleura: There is a large left pleural effusion with associated atelectasis or consolidation, enlarged compared to prior examination. There is a new, trace right pleural effusion. There are scattered subpleural ground-glass pulmonary opacities, most conspicuous in the right upper lobe. Musculoskeletal: No chest wall mass or suspicious bone lesions identified. CT ABDOMEN PELVIS FINDINGS Hepatobiliary: No focal liver abnormality is seen. Status post cholecystectomy. No biliary dilatation. Pancreas: Redemonstrated extensive retroperitoneal inflammation and multiple peripancreatic fluid collections. There is a new discrete fluid collection or component adjacent to the greater curvature of the stomach and spleen measuring approximately 6.2 x 2.7 cm (series 3, image 52). There is an additional new fluid collection within or adjacent to the omentum measuring 4.7 x 2.5 cm (series 3, image 75). There is an additional new fluid collection within the small bowel mesentery measuring 5.0 x 3.2 cm (series 3, image 86). Other fluid collections are not significantly changed, for example anterior to the pancreatic neck (series 3, image 67), adjacent to the porta hepatis (series 3, image 67), adjacent to the left kidney and splenic flexure (series 3, image 73), and posterior to the left kidney (series 3, image 65). Spleen: Splenomegaly, maximum span 14.4 cm. Adrenals/Urinary Tract: Adrenal glands are unremarkable. Kidneys are normal, without renal calculi, solid lesion, or hydronephrosis. Bladder is unremarkable. Stomach/Bowel: Stomach is within normal limits. Appendix appears normal. Inflammatory thickening of the transverse colon (series 3, image 81), similar to prior examination, and of the mid small  bowel, particularly a segment in the anterior abdomen (series 3, image 95). Vascular/Lymphatic: Aortic atherosclerosis. No enlarged abdominal or pelvic lymph nodes. Reproductive: No mass or other abnormality. Other: Extensive anasarca. Small volume ascites, similar to prior examination. Musculoskeletal: No acute or significant osseous findings. IMPRESSION: 1. There is a large left pleural effusion with associated atelectasis or consolidation, enlarged compared to prior examination. There is a new, trace right pleural effusion. 2. There are scattered subpleural ground-glass pulmonary opacities, most conspicuous in the right upper lobe. These are nonspecific and infectious or inflammatory. 3. Redemonstrated extensive retroperitoneal inflammation and multiple peripancreatic fluid collections. There is a new discrete fluid collection or component adjacent to the greater curvature of the stomach and spleen measuring approximately 6.2 x 2.7 cm (series 3, image 52). There is an additional new fluid collection within or adjacent to the omentum measuring 4.7 x 2.5 cm (series 3, image 75). There is an additional new fluid collection within the small bowel mesentery measuring 5.0 x 3.2 cm (series 3, image 86). 4. Other fluid collections are not significantly changed, for example anterior to the pancreatic neck (series 3, image 67), adjacent to the porta hepatis (series 3, image 67), adjacent to the left kidney and splenic flexure (series 3, image 73), and posterior to the left kidney (series 3, image 65). 5. Inflammatory thickening of the transverse colon (series 3, image 81), similar to prior examination, and of the mid small bowel, particularly a segment in the anterior abdomen (series 3, image 95). 6. Extensive anasarca. Small volume ascites, similar to prior examination. Electronically Signed   By: Eddie Candle M.D.   On: 06/25/2019 14:18   Ct  Angio Chest Pe W/cm &/or Wo Cm  Result Date: 07/05/2019 CLINICAL DATA:   Chest pain.  Complicated pancreatitis. EXAM: CT ANGIOGRAPHY CHEST WITH CONTRAST TECHNIQUE: Multidetector CT imaging of the chest was performed using the standard protocol during bolus administration of intravenous contrast. Multiplanar CT image reconstructions and MIPs were obtained to evaluate the vascular anatomy. CONTRAST:  182mL OMNIPAQUE IOHEXOL 350 MG/ML SOLN COMPARISON:  Chest CT from 10 days ago FINDINGS: Cardiovascular: Satisfactory opacification of the pulmonary arteries to the segmental level. No evidence of pulmonary embolism when accounting for intermittent motion artifact. Normal heart size. No pericardial effusion. Aberrant right subclavian artery Mediastinum/Nodes: Negative for adenopathy or mass. Lungs/Pleura: Large left pleural effusion that is layering. Trace right pleural fluid. Near complete collapse of the left lower lobe. No consolidation or edema. Upper Abdomen: As reported separately. Musculoskeletal: No acute or aggressive finding Review of the MIP images confirms the above findings. IMPRESSION: 1. Negative for pulmonary embolism. 2. Large layering left pleural effusion with near complete lower lobe collapse. Electronically Signed   By: Monte Fantasia M.D.   On: 07/05/2019 07:16   Ct Abdomen Pelvis W Contrast  Result Date: 07/10/2019 CLINICAL DATA:  Pleural effusion, abdominal pain, pancreatitis, increased WBC EXAM: CT CHEST, ABDOMEN, AND PELVIS WITH CONTRAST TECHNIQUE: Multidetector CT imaging of the chest, abdomen and pelvis was performed following the standard protocol during bolus administration of intravenous contrast. 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 Normal heart size. No pericardial  effusion. Mediastinum/Nodes: No enlarged mediastinal, hilar, or axillary lymph nodes. Thyroid gland, trachea, and esophagus demonstrate no significant findings. Lungs/Pleura: Moderate left, small right pleural effusion with associated atelectasis or consolidation. Left pleural effusion is slightly decreased compared to prior examination. Musculoskeletal: No chest wall mass or suspicious bone lesions identified. CT ABDOMEN PELVIS FINDINGS Hepatobiliary: No solid liver abnormality is seen. Mildly coarse, nodular contour of the liver. Status post cholecystectomy. Pancreas: Unremarkable. No pancreatic ductal dilatation or surrounding inflammatory changes. Spleen: Normal in size without significant abnormality. Adrenals/Urinary Tract: Adrenal glands are unremarkable. Kidneys are normal, without renal calculi, solid lesion, or hydronephrosis. There is redemonstrated soft tissue thickening in the posterior perirenal fat, likely related to inflammatory fat necrosis and unchanged from prior. Bladder is unremarkable. Stomach/Bowel: Stomach is within normal limits. Weighted enteric feeding tube is positioned with tip in the stomach. Appendix appears normal. There are multiple loops of contrast filled although not overtly distended proximal to mid small bowel. There is gas and fluid in the colon to the rectum. Vascular/Lymphatic: Aortic atherosclerosis. Prominent mesenteric lymph nodes and/or fluid in the central small bowel mesentery unchanged from prior (series 2, image 74). Reproductive: No mass or other abnormality. Other: No abdominal wall hernia or abnormality. Redemonstrated small volume ascites. The peritoneum generally appears slightly thickened and enhancing. There are multiple redemonstrated rim enhancing fluid collections about the abdomen. There is a new collection about the anterior left lobe of the liver and gastrohepatic ligament measuring 15.8 x 6.0 cm (series 2, image 58). There are multiple additional fluid  collections which have generally decreased in size, most notably anterior to the pancreatic body and tail, measuring 12.0 x 3.3 cm, previously 14.9 x 4.3 cm when measured similarly (series 2, image 66) and within the splenogastric ligament measuring 5.8 x 1.9 cm, previously  7.9 x 4.5 cm when measured similarly (series 2, image 46). Musculoskeletal: No acute or significant osseous findings. IMPRESSION: 1. Moderate left, small right pleural effusion with associated atelectasis or consolidation. Left pleural effusion is slightly decreased compared to prior examination. 2. Redemonstrated small volume ascites. The peritoneum generally appears slightly thickened and enhancing. 3. There are multiple redemonstrated rim enhancing fluid collections about the abdomen. There is a new collection about the anterior left lobe of the liver and gastrohepatic ligament measuring 15.8 x 6.0 cm (series 2, image 58). There are multiple additional fluid collections which have generally decreased in size, most notably anterior to the pancreatic body and tail, measuring 12.0 x 3.3 cm, previously 14.9 x 4.3 cm when measured similarly (series 2, image 66) and within the splenogastric ligament measuring 5.8 x 1.9 cm, previously 7.9 x 4.5 cm when measured similarly (series 2, image 46). Fluctuation of fluid collections may to some extent reflect redistribution of fluid within multiple communicating collections or alternately ongoing development of inflammatory fluid collections. The presence or absence of infection is not established. 4.  Other chronic and incidental findings as detailed above. Electronically Signed   By: Eddie Candle M.D.   On: 07/10/2019 16:36   Ct Abdomen Pelvis W Contrast  Result Date: 06/25/2019 CLINICAL DATA:  Shortness of breath, acute on chronic pancreatitis with multiple peripancreatic fluid collections, recurrent epigastric pain EXAM: CT CHEST, ABDOMEN, AND PELVIS WITH CONTRAST TECHNIQUE: Multidetector CT imaging  of the chest, abdomen and pelvis was performed following the standard protocol during bolus administration of intravenous contrast. 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 Normal heart size. No pericardial effusion. Mediastinum/Nodes: No enlarged mediastinal, hilar, or axillary lymph nodes. Thyroid gland, trachea, and esophagus demonstrate no significant findings. Lungs/Pleura: There is a large left pleural effusion with associated atelectasis or consolidation, enlarged compared to prior examination. There is a new, trace right pleural effusion. There are scattered subpleural ground-glass pulmonary opacities, most conspicuous in the right upper lobe. Musculoskeletal: No chest wall mass or suspicious bone lesions identified. CT ABDOMEN PELVIS FINDINGS Hepatobiliary: No focal liver abnormality is seen. Status post cholecystectomy. No biliary dilatation. Pancreas: Redemonstrated extensive retroperitoneal inflammation and multiple peripancreatic fluid collections. There is a new discrete fluid collection or component adjacent to the greater curvature of the stomach and spleen measuring approximately 6.2 x 2.7 cm (series 3, image 52). There is an additional new fluid collection within or adjacent to the omentum measuring 4.7 x 2.5 cm (series 3, image 75). There is an additional new fluid collection within the small bowel mesentery measuring 5.0 x 3.2 cm (series 3, image 86). Other fluid collections are not significantly changed, for example anterior to the pancreatic neck (series 3, image 67), adjacent to the porta hepatis (series 3, image 67), adjacent to the left kidney and splenic flexure (series 3, image 73), and posterior to the left kidney (series 3, image 65). Spleen: Splenomegaly, maximum span 14.4 cm. Adrenals/Urinary  Tract: Adrenal glands are unremarkable. Kidneys are normal, without renal calculi, solid lesion, or hydronephrosis. Bladder is unremarkable. Stomach/Bowel: Stomach is within normal limits. Appendix appears normal. Inflammatory thickening of the transverse colon (series 3, image 81), similar to prior examination, and of the mid small bowel, particularly a segment in the anterior abdomen (series 3, image 95). Vascular/Lymphatic: Aortic atherosclerosis. No enlarged abdominal or pelvic lymph nodes. Reproductive: No mass  or other abnormality. Other: Extensive anasarca. Small volume ascites, similar to prior examination. Musculoskeletal: No acute or significant osseous findings. IMPRESSION: 1. There is a large left pleural effusion with associated atelectasis or consolidation, enlarged compared to prior examination. There is a new, trace right pleural effusion. 2. There are scattered subpleural ground-glass pulmonary opacities, most conspicuous in the right upper lobe. These are nonspecific and infectious or inflammatory. 3. Redemonstrated extensive retroperitoneal inflammation and multiple peripancreatic fluid collections. There is a new discrete fluid collection or component adjacent to the greater curvature of the stomach and spleen measuring approximately 6.2 x 2.7 cm (series 3, image 52). There is an additional new fluid collection within or adjacent to the omentum measuring 4.7 x 2.5 cm (series 3, image 75). There is an additional new fluid collection within the small bowel mesentery measuring 5.0 x 3.2 cm (series 3, image 86). 4. Other fluid collections are not significantly changed, for example anterior to the pancreatic neck (series 3, image 67), adjacent to the porta hepatis (series 3, image 67), adjacent to the left kidney and splenic flexure (series 3, image 73), and posterior to the left kidney (series 3, image 65). 5. Inflammatory thickening of the transverse colon (series 3, image 81), similar to prior  examination, and of the mid small bowel, particularly a segment in the anterior abdomen (series 3, image 95). 6. Extensive anasarca. Small volume ascites, similar to prior examination. Electronically Signed   By: Eddie Candle M.D.   On: 06/25/2019 14:18   Ct Abdomen Pelvis W Contrast  Result Date: 06/20/2019 CLINICAL DATA:  Abdominal pain.  Neutropenia. EXAM: CT ABDOMEN AND PELVIS WITH CONTRAST TECHNIQUE: Multidetector CT imaging of the abdomen and pelvis was performed using the standard protocol following bolus administration of intravenous contrast. CONTRAST:  135mL OMNIPAQUE IOHEXOL 300 MG/ML  SOLN COMPARISON:  None. FINDINGS: Lower chest: There is a moderate-sized left-sided pleural effusion with near complete collapse of the left lower lobe.The heart size is normal. Hepatobiliary: The liver is normal. Status post cholecystectomy.There is no biliary ductal dilation. Pancreas: There are multiple peripancreatic fluid collections the largest of which measures approximately 3.7 by 1.8 cm. The pancreas appears to enhance symmetrically. Spleen: The spleen is enlarged measuring approximately 14 cm craniocaudad. Adrenals/Urinary Tract: --Adrenal glands: No adrenal hemorrhage. --Right kidney/ureter: No hydronephrosis or perinephric hematoma. --Left kidney/ureter: There is no left-sided hydronephrosis. There is a complex collection in the left posterior pararenal space measuring approximately 8.5 x 2.5 cm. --Urinary bladder: Unremarkable. Stomach/Bowel: --Stomach/Duodenum: There is some wall thickening of the stomach. --Small bowel: No dilatation or inflammation. --Colon: There are soft tissue densities along the descending colon measuring approximately 2.8 x 2.4 cm. There is a collection of the splenic flexure measuring 4.7 x 2.7 cm causing mass effect on the nearby:Marland Kitchen There is wall thickening of the transverse colon without evidence of an obstruction. --Appendix: Not visualized. No right lower quadrant inflammation or  free fluid. Vascular/Lymphatic: Atherosclerotic changes are noted of the abdominal aorta without evidence of an abdominal aortic aneurysm. The portal vein and splenic vein remain patent. The splenic artery remains patent. --No retroperitoneal lymphadenopathy. --No mesenteric lymphadenopathy. --No pelvic or inguinal lymphadenopathy. Reproductive: Unremarkable Other: There is a small volume of free fluid in the pelvis. There are multiple scattered collections in the retroperitoneum and peritoneal cavity. A few these collections demonstrate mild rim enhancement. For example in the left upper quadrant there is a 3.9 x 1.9 cm collection that demonstrates mild peripheral rim enhancement. There is a 3.8  by 4.4 cm collection in the region of the gallbladder fossa. Given the surgical clips in the gallbladder fossa this is favored to represent a loculated fluid collection as opposed to a remnant gallbladder. Musculoskeletal. No acute displaced fractures. IMPRESSION: 1. Overall findings concerning for pancreatitis with multiple loculated fluid collections as detailed above. Some of the smaller collections, for example in the left upper quadrant, demonstrate rim enhancement. An abscess is not excluded. There is no CT evidence for pancreatic necrosis. 2. Moderate-sized left-sided pleural effusion with at least partial collapse of the left lower lobe. 3. Diffuse wall thickening of the transverse colon and splenic flexure favored to be reactive. Other considerations include infectious or inflammatory colitis. 4. Splenomegaly.  The splenic vein remains patent. Electronically Signed   By: Constance Holster M.D.   On: 06/20/2019 21:07   Dg Chest Port 1 View  Result Date: 07/12/2019 CLINICAL DATA:  Pt reported right sided chest wall pain. Medical hx of enlarging pancreatic pseudocysts and one that is larger and newer in upper abdomen exerting mass effect on anterior aspect of left lobe of liver. EXAM: PORTABLE CHEST 1 VIEW  COMPARISON:  07/10/2019 FINDINGS: Shallow lung inflation. Bilateral pleural effusions and bibasilar opacities appear stable. Feeding tube has been withdrawn, tip now within the level of the mid esophagus. IMPRESSION: Feeding tube tip within the mid esophagus. These results will be called to the ordering clinician or representative by the Radiologist Assistant, and communication documented in the PACS or zVision Dashboard. Electronically Signed   By: Nolon Nations M.D.   On: 07/12/2019 14:07   Dg Chest Port 1 View  Result Date: 07/10/2019 CLINICAL DATA:  Leukocytosis EXAM: PORTABLE CHEST 1 VIEW COMPARISON:  None. FINDINGS: Feeding tube tip is in the distal esophagus. There is a small left pleural effusion with patchy consolidation in the left base. There is atelectatic change in each lung base as well. Heart is upper normal in size with pulmonary vascularity normal. No adenopathy. No bone lesions. IMPRESSION: Feeding tube tip in distal esophagus. Persistent patchy consolidation left base with bibasilar atelectasis. Small left pleural effusion. Stable cardiac silhouette. These results will be called to the ordering clinician or representative by the Radiologist Assistant, and communication documented in the PACS or zVision Dashboard. Electronically Signed   By: Lowella Grip III M.D.   On: 07/10/2019 08:16   Dg Chest Port 1 View  Result Date: 07/05/2019 CLINICAL DATA:  Status post left-sided thoracentesis EXAM: PORTABLE CHEST 1 VIEW COMPARISON:  July 05, 2019 FINDINGS: The left-sided pleural effusion has significantly decreased in size from the prior study. There is no evidence for left-sided pneumothorax. Bibasilar airspace opacities are noted, left worse than right. The lung volumes are low. The heart size is stable from prior study. IMPRESSION: 1. No pneumothorax status post left-sided thoracentesis. 2. Significant interval decrease in size of the left-sided pleural effusion. 3. Bibasilar airspace  opacities, left greater than right, favored to represent atelectasis. 4. Low lung volumes. Electronically Signed   By: Constance Holster M.D.   On: 07/05/2019 16:31   Dg Chest Portable 1 View  Result Date: 07/05/2019 CLINICAL DATA:  Chest pain. EXAM: PORTABLE CHEST 1 VIEW COMPARISON:  Radiograph 06/26/2019, CT 06/25/2019 FINDINGS: Re-accumulation of left pleural effusion after thoracentesis. Pleural effusion is moderate in size with associated underlying left lung base atelectasis/consolidation. Possible small right pleural effusion. Unchanged heart size and mediastinal contours. Increasing patchy right lung base opacity. No pneumothorax. IMPRESSION: 1. Re-accumulation of left pleural effusion after thoracentesis, now  moderate in size. Associated left lung base atelectasis/consolidation. 2. Possible small right pleural effusion. Increasing right lung base opacity may be atelectasis or pneumonia. Electronically Signed   By: Keith Rake M.D.   On: 07/05/2019 03:22   Dg Chest Port 1 View  Result Date: 06/24/2019 CLINICAL DATA:  Chest pain for 1 hour EXAM: PORTABLE CHEST 1 VIEW COMPARISON:  06/20/2019 FINDINGS: Left-sided pleural effusion is again seen and slightly enlarged when compare with the prior study. Likely underlying atelectasis/infiltrate is present in the left base. The right lung is clear. No bony abnormality is noted. IMPRESSION: Increasing left-sided pleural effusion. Electronically Signed   By: Inez Catalina M.D.   On: 06/24/2019 19:33   Dg Chest Portable 1 View  Result Date: 06/20/2019 CLINICAL DATA:  Chest pain EXAM: PORTABLE CHEST 1 VIEW COMPARISON:  None. FINDINGS: There is airspace consolidation in the left base with left pleural effusion. Lungs elsewhere are clear. Heart size and pulmonary vascularity are normal. No adenopathy. No bone lesions. IMPRESSION: Left lower lobe airspace consolidation consistent with pneumonia. Small left pleural effusion. Lungs elsewhere clear. No  adenopathy. Heart size normal. Followup PA and lateral chest radiographs recommended in 3-4 weeks following trial of antibiotic therapy to ensure resolution and exclude underlying malignancy. Electronically Signed   By: Lowella Grip III M.D.   On: 06/20/2019 19:32   Dg Abd Portable 1v  Result Date: 07/14/2019 CLINICAL DATA:  NG tube placement. EXAM: PORTABLE ABDOMEN - 1 VIEW 11:55 a.m. COMPARISON:  07/14/2019 at 3:19 a.m. FINDINGS: The feeding tube tip is in the midbody of the stomach, essentially unchanged since the prior study. The visualized bowel gas pattern is normal. Small left pleural effusion is noted with slight bibasilar atelectasis. IMPRESSION: Feeding tube tip in the mid body of the stomach, unchanged. Electronically Signed   By: Lorriane Shire M.D.   On: 07/14/2019 12:51   Dg Abd Portable 1v  Result Date: 07/14/2019 CLINICAL DATA:  Nasogastric tube placement EXAM: PORTABLE ABDOMEN - 1 VIEW COMPARISON:  None. FINDINGS: The weighted tip of the enteric tube projects over the stomach. There is persistent small bowel dilatation. IMPRESSION: Weighted enteric tube tip projects over the stomach. Electronically Signed   By: Ulyses Jarred M.D.   On: 07/14/2019 03:46   Dg Abd Portable 1v  Result Date: 07/11/2019 CLINICAL DATA:  Per RN-patient called that he moved to use the bathroom and he displaced the core track feeding tube from 70 mark to 35. I advanced it back but he is c/o pain and says he feels its is displaced." EXAM: PORTABLE ABDOMEN - 1 VIEW COMPARISON:  Radiograph yesterday at 10:51 a.m. FINDINGS: Tip of the weighted enteric tube is in the left upper quadrant below the diaphragm, in the pre pyloric stomach. Surgical clips in the right abdomen. IMPRESSION: Tip of the weighted enteric tube below the diaphragm in the pre pyloric stomach. Electronically Signed   By: Keith Rake M.D.   On: 07/11/2019 00:45   Dg Abd Portable 1v  Result Date: 07/10/2019 CLINICAL DATA:  Check feeding  catheter placement EXAM: PORTABLE ABDOMEN - 1 VIEW COMPARISON:  07/09/2019 FINDINGS: Weighted feeding catheter is noted with the tip in the distal stomach. Mildly dilated small bowel is noted similar to that seen on the prior exam. IMPRESSION: Feeding catheter within the distal stomach. Electronically Signed   By: Inez Catalina M.D.   On: 07/10/2019 11:38   Dg Abd Portable 1v  Result Date: 07/09/2019 CLINICAL DATA:  Feeding tube placement  EXAM: PORTABLE ABDOMEN - 1 VIEW COMPARISON:  07/05/2019 FINDINGS: Bibasilar pleural effusions and airspace disease. Esophageal tube tip overlies the proximal stomach. Dilated small bowel in the central abdomen. IMPRESSION: Esophageal tube tip overlies the proximal stomach. Electronically Signed   By: Donavan Foil M.D.   On: 07/09/2019 19:32   Ct Renal Stone Study  Result Date: 07/05/2019 CLINICAL DATA:  Chest pain. EXAM: CT ABDOMEN AND PELVIS WITHOUT CONTRAST TECHNIQUE: Multidetector CT imaging of the abdomen and pelvis was performed following the standard protocol without IV contrast. COMPARISON:  Fifteen days ago FINDINGS: Lower chest: At least moderate left pleural effusion with multi segment atelectasis. Pending chest CT. Hepatobiliary: No focal liver abnormality.Cholecystectomy. Pancreas: History of recurrent pancreatitis. Progressive organized collections: 1. 8 x 4 cm along the upper greater curvature of the stomach 2. 6 cm along the mid greater curvature of the stomach-which may be connected to #1. 3. Peripancreatic to splenic tail, 15 x 4 by up to 6 cm-with further extent tracking inferiorly along the descending colon. 4. Ligamentum venosum of the liver measuring 5 x 3 cm 5. Ventral midline abdomen at 5 cm 6. Lower ventral midline abdomen with interloop distortion, 9 cm. These collections exert mass effect on the stomach, transverse colon, and descending colon. Fat density with rim of stranding posterior to the left kidney in the posterior pararenal space, stable in  compatible with fat necrosis. There is generalized retroperitoneal edema about the collections and pancreas. No gas within any of the collections to suggest fistula or definite superinfection. Spleen: Stable size and density. Adrenals/Urinary Tract: Negative adrenals. No hydronephrosis or stone. Unremarkable bladder. Stomach/Bowel: No obstruction. Low-density about the hepatic flexure is likely a combination of reactive wall thickening and pericolonic fluid. Vascular/Lymphatic: No visible acute vascular abnormality. No noted adenopathy. Reproductive:Negative Other: Right upper quadrant ascites which may be loculated. Musculoskeletal: No acute abnormalities. IMPRESSION: 1. Recent pancreatitis with multiple organized fluid collections in the abdomen as listed and measured above. The largest measures 15 x 4 x 6 cm. Collections exert mass effect on the stomach, transverse colon, descending colon without obstruction. 2. Stable left posterior pararenal fat necrosis. 3. At least moderate left pleural effusion with multi segment atelectasis. Electronically Signed   By: Monte Fantasia M.D.   On: 07/05/2019 07:12   Ir Thoracentesis Asp Pleural Space W/img Guide  Result Date: 06/26/2019 INDICATION: Shortness of breath. Left-sided pleural effusion. Request for diagnostic and therapeutic thoracentesis. EXAM: ULTRASOUND GUIDED LEFT THORACENTESIS MEDICATIONS: None. COMPLICATIONS: None immediate. PROCEDURE: An ultrasound guided thoracentesis was thoroughly discussed with the patient and questions answered. The benefits, risks, alternatives and complications were also discussed. The patient understands and wishes to proceed with the procedure. Written consent was obtained. Ultrasound was performed to localize and mark an adequate pocket of fluid in the left chest. The area was then prepped and draped in the normal sterile fashion. 1% Lidocaine was used for local anesthesia. Under ultrasound guidance a 6 Fr Safe-T-Centesis  catheter was introduced. Thoracentesis was performed. The catheter was removed and a dressing applied. FINDINGS: A total of approximately 1.3 L of hazy yellow fluid was removed. Samples were sent to the laboratory as requested by the clinical team. IMPRESSION: Successful ultrasound guided left thoracentesis yielding 1.3 L of pleural fluid. Read by: Ascencion Dike PA-C Electronically Signed   By: Markus Daft M.D.   On: 06/26/2019 09:19   US Thoracentesis Asp Pleural Space W/img Guide  Result Date: 07/05/2019 INDICATION: Patient with history of recurrent pancreatitis, GERD, dyspnea, recurrent  left pleural effusion. Request made for diagnostic and therapeutic left thoracentesis. EXAM: ULTRASOUND GUIDED DIAGNOSTIC AND THERAPEUTIC LEFT THORACENTESIS MEDICATIONS: None COMPLICATIONS: None immediate. PROCEDURE: An ultrasound guided thoracentesis was thoroughly discussed with the patient and questions answered. The benefits, risks, alternatives and complications were also discussed. The patient understands and wishes to proceed with the procedure. Written consent was obtained. Ultrasound was performed to localize and mark an adequate pocket of fluid in the left chest. The area was then prepped and draped in the normal sterile fashion. 1% Lidocaine was used for local anesthesia. Under ultrasound guidance a 6 Fr Safe-T-Centesis catheter was introduced. Thoracentesis was performed. The catheter was removed and a dressing applied. FINDINGS: A total of approximately 770 cc of yellow fluid was removed. Samples were sent to the laboratory as requested by the clinical team. IMPRESSION: Successful ultrasound guided diagnostic and therapeutic left thoracentesis yielding 770 cc of pleural fluid. Read by: Rowe Robert, PA-C Electronically Signed   By: Markus Daft M.D.   On: 07/05/2019 16:12   (Echo, Carotid, EGD, Colonoscopy, ERCP)    Subjective:   Discharge Exam: Vitals:   07/15/19 0743 07/15/19 0758  BP: 112/65   Pulse:  (!) 111   Resp: (!) 22 20  Temp: 99.1 F (37.3 C)   SpO2: 99% 99%   Vitals:   07/15/19 0422 07/15/19 0435 07/15/19 0743 07/15/19 0758  BP:   112/65   Pulse:   (!) 111   Resp:  16 (!) 22 20  Temp:   99.1 F (37.3 C)   TempSrc:   Oral   SpO2:  97% 99% 99%  Weight: 87.1 kg     Height:        Physical exam In some distress with pain HEENT: NG +, moist mucosa, supple neck Chest: Diminished breath sounds over left lung base, rt mid back tenderness CVS: Normal S1-S2 GI: Soft, minimal epigastric tenderness, bowel sounds present, nondistended Musculoskeletal: Warm, no edema    The results of significant diagnostics from this hospitalization (including imaging, microbiology, ancillary and laboratory) are listed below for reference.     Microbiology: Recent Results (from the past 240 hour(s))  Gram stain     Status: None   Collection Time: 07/05/19  4:15 PM   Specimen: Fluid  Result Value Ref Range Status   Specimen Description FLUID  Final   Special Requests NONE  Final   Gram Stain   Final    FEW WBC PRESENT, PREDOMINANTLY MONONUCLEAR NO ORGANISMS SEEN Performed at [facility], [address], [place], [place] [address]    Report Status 07/06/2019 FINAL  Final  Culture, body fluid-bottle     Status: None   Collection Time: 07/05/19  4:15 PM   Specimen: Fluid  Result Value Ref Range Status   Specimen Description FLUID  Final   Special Requests NONE  Final   Culture   Final    NO GROWTH 5 DAYS Performed at [facility] [address] [place],  [address]    Report Status 07/10/2019 FINAL  Final  MRSA PCR Screening     Status: None   Collection Time: 07/06/19  3:30 AM   Specimen: Nasal Mucosa; Nasopharyngeal  Result Value Ref Range Status   MRSA by PCR NEGATIVE NEGATIVE Final    Comment:        The GeneXpert MRSA Assay (FDA approved for NASAL specimens only), is one component of a comprehensive MRSA colonization surveillance program. It is  not  intended to diagnose MRSA infection nor to guide or monitor treatment for MRSA infections. Performed at [facility], [place] [address] [place], [place] [address]   Culture, blood (routine x 2)     Status: None (Preliminary result)   Collection Time: 07/10/19 10:00 AM   Specimen: BLOOD  Result Value Ref Range Status   Specimen Description   Final    BLOOD RIGHT ARM Performed at [place] [address] [place], [place] [address]    Special Requests   Final    BOTTLES DRAWN AEROBIC AND ANAEROBIC Blood Culture adequate volume Performed at [place] [address] [place], [place] [address]    Culture   Final    NO GROWTH 4 DAYS Performed at [facility], [place] [address], [place], [place] [address]    Report Status PENDING  Incomplete  Culture, blood (routine x 2)     Status: None (Preliminary result)   Collection Time: 07/10/19 10:15 AM   Specimen: BLOOD  Result Value Ref Range Status   Specimen Description   Final    BLOOD RIGHT HAND Performed at [place] [address] [place], [place] [address]    Special Requests   Final    BOTTLES DRAWN AEROBIC AND ANAEROBIC Blood Culture adequate volume Performed at [facility] [address], [place], [place] [address]    Culture   Final    NO GROWTH 4 DAYS Performed at [facility], [place] [address] [place],  [address]    Report Status PENDING  Incomplete     Labs: BNP (last 3 results) Recent Labs    07/05/19 0420  BNP [id number]   Basic Metabolic Panel: Recent Labs  Lab 07/09/19 1706 07/10/19 0521 07/10/19 1753 07/11/19 0443 07/12/19 0410 07/12/19 0820 07/13/19 0839 07/14/19 0431  NA  --  131*  --  130*  --  130* 126* 130*  K  --  3.9  --  3.8  --  4.3 4.7 4.5  CL  --  93*  --  93*  --  91* 91* 95*  CO2  --  29  --  30  --  28 27 30   GLUCOSE  --  124*  --  114*  --  110* 124* 101*  BUN   --  10  --  11  --  11 13 14   CREATININE  --  0.49*  --  0.46* 0.43* 0.46* 0.38* <0.30*  CALCIUM  --  8.3*  --  7.9*  --  8.1* 7.9* 8.1*  MG 2.0 2.0 2.0  --   --   --   --   --   PHOS 3.3 3.3 3.0  --   --   --   --   --    Liver Function Tests: Recent Labs  Lab 07/11/19 0443 07/12/19 0820 07/13/19 0839  AST 36 44* 51*  ALT 34 41 46*  ALKPHOS 172* 220* 250*  BILITOT 0.5 0.4 0.6  PROT 5.4* 5.9* 5.6*  ALBUMIN 1.4* 1.6* 1.6*   No results for input(s): LIPASE, AMYLASE in the last 168 hours. No results for input(s): AMMONIA in the last 168 hours. CBC: Recent Labs  Lab 07/09/19 0420 07/10/19 0521 07/10/19 1254 07/12/19 0820 07/13/19 0839 07/14/19 0431  WBC 20.4* 28.0* 32.2* 32.8* 27.9* 20.9*  NEUTROABS 16.5*  --  27.1*  --   --   --   HGB  9.8* 10.8* 10.5* 9.8* 9.7* 8.9*  HCT 31.6* 34.6* 33.6* 32.0* 31.6* 29.3*  MCV 87.5 86.7 87.7 88.6 88.3 89.9  PLT 564* 676* 663* 528* 526* 411*   Cardiac Enzymes: No results for input(s): CKTOTAL, CKMB, CKMBINDEX, TROPONINI in the last 168 hours. BNP: Invalid input(s): POCBNP CBG: Recent Labs  Lab 07/14/19 1210 07/14/19 1734 07/15/19 0014 07/15/19 0405 07/15/19 0739  GLUCAP 86 93 101* 104* 108*   D-Dimer No results for input(s): DDIMER in the last 72 hours. Hgb A1c No results for input(s): HGBA1C in the last 72 hours. Lipid Profile No results for input(s): CHOL, HDL, LDLCALC, TRIG, CHOLHDL, LDLDIRECT in the last 72 hours. Thyroid function studies No results for input(s): TSH, T4TOTAL, T3FREE, THYROIDAB in the last 72 hours.  Invalid input(s): FREET3 Anemia work up No results for input(s): VITAMINB12, FOLATE, FERRITIN, TIBC, IRON, RETICCTPCT in the last 72 hours. Urinalysis    Component Value Date/Time   COLORURINE AMBER (A) 07/05/2019 0246   APPEARANCEUR CLOUDY (A) 07/05/2019 0246   LABSPEC >1.030 (H) 07/05/2019 0246   PHURINE 5.5 07/05/2019 0246   GLUCOSEU NEGATIVE 07/05/2019 0246   HGBUR NEGATIVE 07/05/2019 0246    BILIRUBINUR SMALL (A) 07/05/2019 0246   KETONESUR NEGATIVE 07/05/2019 0246   PROTEINUR 30 (A) 07/05/2019 0246   NITRITE NEGATIVE 07/05/2019 0246   LEUKOCYTESUR NEGATIVE 07/05/2019 0246   Sepsis Labs Invalid input(s): PROCALCITONIN,  WBC,  LACTICIDVEN Microbiology Recent Results (from the past 240 hour(s))  Gram stain     Status: None   Collection Time: 07/05/19  4:15 PM   Specimen: Fluid  Result Value Ref Range Status   Specimen Description FLUID  Final   Special Requests NONE  Final   Gram Stain   Final    FEW WBC PRESENT, PREDOMINANTLY MONONUCLEAR NO ORGANISMS SEEN Performed at [facility], [place] [address], [place], [place] [address]    Report Status 07/06/2019 FINAL  Final  Culture, body fluid-bottle     Status: None   Collection Time: 07/05/19  4:15 PM   Specimen: Fluid  Result Value Ref Range Status   Specimen Description FLUID  Final   Special Requests NONE  Final   Culture   Final    NO GROWTH 5 DAYS Performed at [facility] [address], [place], [place] [address]    Report Status 07/10/2019 FINAL  Final  MRSA PCR Screening     Status: None   Collection Time: 07/06/19  3:30 AM   Specimen: Nasal Mucosa; Nasopharyngeal  Result Value Ref Range Status   MRSA by PCR NEGATIVE NEGATIVE Final    Comment:        The GeneXpert MRSA Assay (FDA approved for NASAL specimens only), is one component of a comprehensive MRSA colonization surveillance program. It is not intended to diagnose MRSA infection nor to guide or monitor treatment for MRSA infections. Performed at [facility], [place] [address], [place], [place] [address]   Culture, blood (routine x 2)     Status: None (Preliminary result)   Collection Time: 07/10/19 10:00 AM   Specimen: BLOOD  Result Value Ref Range Status   Specimen Description   Final    BLOOD RIGHT ARM Performed at [facility] [address] [place], [place] [address]    Special  Requests   Final    BOTTLES DRAWN AEROBIC AND ANAEROBIC Blood Culture adequate volume Performed at [facility] [address] [place], [place] [address]  Culture   Final    NO GROWTH 4 DAYS Performed at [facility], [place] [address], [place], [place] [address]    Report Status PENDING  Incomplete  Culture, blood (routine x 2)     Status: None (Preliminary result)   Collection Time: 07/10/19 10:15 AM   Specimen: BLOOD  Result Value Ref Range Status   Specimen Description   Final    BLOOD RIGHT HAND Performed at [facility] [address] [place], [place] [address]    Special Requests   Final    BOTTLES DRAWN AEROBIC AND ANAEROBIC Blood Culture adequate volume Performed at [facility] [address] [place], [place] [address]    Culture   Final    NO GROWTH 4 DAYS Performed at [facility], [place] [address], [place], [place] [address]    Report Status PENDING  Incomplete     Time coordinating discharge: 35 minutes  SIGNED:   Berle Mull, MD  [facility] 07/15/2019, 9:31 AM Pager   If 7PM-7AM, please contact night-coverage [email or web address] Password [email or web address]

## 2019-07-15 NOTE — Progress Notes (Signed)
Report called to [facility]. Pt is ready is for transport.

## 2019-07-15 NOTE — Progress Notes (Signed)
2.25mg  of PCA Morphine wasted. Witness by Rosezella Florida RN.

## 2019-07-16 LAB — GLUCOSE, CAPILLARY
Glucose-Capillary: 115 mg/dL — ABNORMAL HIGH (ref 70–99)
Glucose-Capillary: 104 mg/dL — ABNORMAL HIGH (ref 70–99)
Glucose-Capillary: 110 mg/dL — ABNORMAL HIGH (ref 70–99)

## 2020-10-08 ENCOUNTER — Emergency Department (HOSPITAL_COMMUNITY)
Admission: EM | Admit: 2020-10-08 | Discharge: 2020-10-08 | Disposition: A | Discharge status: Home / Self Care | Payer: Self-pay | Attending: Emergency Medicine | Admitting: Emergency Medicine

## 2020-10-08 ENCOUNTER — Encounter (HOSPITAL_COMMUNITY): Payer: Self-pay

## 2020-10-08 ENCOUNTER — Other Ambulatory Visit: Payer: Self-pay

## 2020-10-08 ENCOUNTER — Emergency Department (HOSPITAL_COMMUNITY): Payer: Self-pay

## 2020-10-08 DIAGNOSIS — Z87891 Personal history of nicotine dependence: Secondary | ICD-10-CM | POA: Insufficient documentation

## 2020-10-08 DIAGNOSIS — R202 Paresthesia of skin: Secondary | ICD-10-CM | POA: Insufficient documentation

## 2020-10-08 DIAGNOSIS — Z79899 Other long term (current) drug therapy: Secondary | ICD-10-CM | POA: Insufficient documentation

## 2020-10-08 DIAGNOSIS — R0789 Other chest pain: Secondary | ICD-10-CM | POA: Insufficient documentation

## 2020-10-08 DIAGNOSIS — R0602 Shortness of breath: Secondary | ICD-10-CM | POA: Insufficient documentation

## 2020-10-08 DIAGNOSIS — M5412 Radiculopathy, cervical region: Secondary | ICD-10-CM | POA: Insufficient documentation

## 2020-10-08 DIAGNOSIS — K219 Gastro-esophageal reflux disease without esophagitis: Secondary | ICD-10-CM | POA: Insufficient documentation

## 2020-10-08 LAB — CBC
HCT: 52.1 % — ABNORMAL HIGH (ref 39.0–52.0)
Hemoglobin: 17.4 g/dL — ABNORMAL HIGH (ref 13.0–17.0)
MCH: 32.6 pg (ref 26.0–34.0)
MCHC: 33.4 g/dL (ref 30.0–36.0)
MCV: 97.6 fL (ref 80.0–100.0)
Platelets: 230 10*3/uL (ref 150–400)
RBC: 5.34 MIL/uL (ref 4.22–5.81)
RDW: 12.4 % (ref 11.5–15.5)
WBC: 10.1 10*3/uL (ref 4.0–10.5)
nRBC: 0 % (ref 0.0–0.2)

## 2020-10-08 LAB — BASIC METABOLIC PANEL
Anion gap: 8 (ref 5–15)
BUN: 13 mg/dL (ref 6–20)
CO2: 24 mmol/L (ref 22–32)
Calcium: 9.6 mg/dL (ref 8.9–10.3)
Chloride: 106 mmol/L (ref 98–111)
Creatinine, Ser: 0.81 mg/dL (ref 0.61–1.24)
GFR, Estimated: >60 mL/min (ref >60)
Glucose, Bld: 107 mg/dL — ABNORMAL HIGH (ref 70–99)
Potassium: 4 mmol/L (ref 3.5–5.1)
Sodium: 138 mmol/L (ref 135–145)

## 2020-10-08 LAB — HEPATIC FUNCTION PANEL
ALT: 16 U/L (ref 0–44)
AST: 13 U/L — ABNORMAL LOW (ref 15–41)
Albumin: 3.6 g/dL (ref 3.5–5.0)
Alkaline Phosphatase: 56 U/L (ref 38–126)
Bilirubin, Direct: 0.1 mg/dL (ref 0.0–0.2)
Indirect Bilirubin: 0.8 mg/dL (ref 0.3–0.9)
Total Bilirubin: 0.9 mg/dL (ref 0.3–1.2)
Total Protein: 6.6 g/dL (ref 6.5–8.1)

## 2020-10-08 LAB — TROPONIN I (HIGH SENSITIVITY)
Troponin I (High Sensitivity): 3 ng/L (ref <18)
Troponin I (High Sensitivity): <2 ng/L (ref <18)

## 2020-10-08 LAB — LIPASE, BLOOD: Lipase: 34 U/L (ref 11–51)

## 2020-10-08 MED ORDER — ALUM & MAG HYDROXIDE-SIMETH 200-200-20 MG/5ML PO SUSP
30.0000 mL | Freq: Once | ORAL | Status: AC
  Administered 2020-10-08: 30 mL via ORAL
  Filled 2020-10-08: qty 30

## 2020-10-08 MED ORDER — FAMOTIDINE 20 MG PO TABS: 20.0000 mg | ORAL_TABLET | Freq: Two times a day (BID) | ORAL | 0 refills | Status: AC

## 2020-10-08 MED ORDER — IBUPROFEN 400 MG PO TABS
600.0000 mg | ORAL_TABLET | Freq: Once | ORAL | Status: AC
  Administered 2020-10-08: 600 mg via ORAL
  Filled 2020-10-08: qty 1

## 2020-10-08 MED ORDER — METHOCARBAMOL 500 MG PO TABS
500.0000 mg | ORAL_TABLET | Freq: Once | ORAL | Status: AC
  Administered 2020-10-08: 500 mg via ORAL
  Filled 2020-10-08: qty 1

## 2020-10-08 MED ORDER — NAPROXEN 500 MG PO TABS: 500.0000 mg | ORAL_TABLET | Freq: Two times a day (BID) | ORAL | 0 refills | Status: AC

## 2020-10-08 NOTE — Discharge Instructions (Signed)
Take the medications as prescribed. Make sure you are using a heating pad in the area of pain to help with muscle pain. It is important for you to establish care with a primary care provider.  He can follow-up with the 1 listed below and I have consulted case management/social work for assistance regarding this. Return to the ER if you start to experience worsening chest pain, trouble breathing, increased leg swelling, fever, vomiting or coughing up blood.

## 2020-10-08 NOTE — ED Provider Notes (Signed)
[facility] EMERGENCY DEPARTMENT Provider Note   CSN: [id number] Arrival date & time: 10/08/20  1106     History Chief Complaint  Patient presents with  . arm tingling/ CP    Kyle Mooney is a 49 y.o. male with a past medical history of GERD, pancreatitis, IBS presenting to the ED with chest pressure and left arm paresthesias.  For the past 2 weeks has been having pain in his left upper arm with paresthesias down his left arm.  He denies any known injury or trauma but does state that he does constant work at his job and may have pulled a muscle.  He was seen and evaluated at urgent care for these symptoms, told to take a muscle relaxer but has not been doing so due to side effects.  For the past 1 week has noticed chest pressure that has been constant.  Pressure is in the middle of his chest.  Has had some shortness of breath when the pain worsens.  He does report ongoing stress and anxiety related to several social factors including his job and ex-wife.  He has not tried any other medications to help with his symptoms.  Denies any abdominal pain, nausea, vomiting.  Reports cough which is chronic for him and believes it is due to his tobacco use.  He any unilateral leg swelling but states that his legs will swell bilaterally from time to time "I think it is because of the salt and all the walking that I do for work."  Denies any swelling currently.  No history of DVT or PE, MI, recent immobilization, fever, wheezing.  No prior neck, back or shoulder surgeries.  HPI     Past Medical History:  Diagnosis Date  . Ex-cigarette smoker 04/2019  . GERD (gastroesophageal reflux disease)   . IBS (irritable bowel syndrome)   . Idiopathic pancreatitis     Patient Active Problem List   Diagnosis Date Noted  . Infected pseudocyst of pancreas 07/12/2019  . Protein-calorie malnutrition, severe ([place]) 07/07/2019  . Alcohol dependence, binge pattern ([place]) 07/05/2019  . History of  cholecystectomy 07/05/2019  . Abdominal pain   . Acute on chronic pancreatitis ([facility])   . Constipation   . Thrombocytosis 06/20/2019  . LLL pneumonia 06/20/2019  . Recurrent left pleural effusion 06/20/2019  . GERD (gastroesophageal reflux disease) 06/20/2019  . IBS (irritable bowel syndrome) 06/20/2019  . Pancreatitis, recurrent 06/20/2019  . Idiopathic acute pancreatitis with uninfected necrosis 06/09/2019  . Leukocytosis 05/16/2019  . Diarrhea 05/06/2019  . Sepsis ([place]) 05/05/2019  . Cough 04/24/2019  . Tobacco abuse 09/21/2018  . Pneumonia due to infectious organism 09/21/2018  . Chest pain 09/21/2018    Past Surgical History:  Procedure Laterality Date  . CHOLECYSTECTOMY    . IR THORACENTESIS ASP PLEURAL SPACE W/IMG GUIDE  06/26/2019       No family history on file.  Social History   Tobacco Use  . Smoking status: Former Smoker    Types: Cigarettes    Quit date: 04/20/2019    Years since quitting: 1.4  . Smokeless tobacco: Never Used  Vaping Use  . Vaping Use: Former  Substance Use Topics  . Alcohol use: Never  . Drug use: Never    Home Medications Prior to Admission medications   Medication Sig Start Date End Date Taking? Authorizing Provider  ibuprofen (ADVIL) 200 MG tablet Take 200 mg by mouth every 6 (six) hours as needed for moderate pain.   Yes  [provider]  Amino Acids-Protein Hydrolys (FEEDING SUPPLEMENT, PRO-STAT SUGAR FREE 64,) LIQD Place 30 mLs into feeding tube 2 (two) times daily. Patient not taking: Reported on 10/08/2020 07/12/19   Dhungel, Flonnie Overman, MD  famotidine (PEPCID) 20 MG tablet Take 1 tablet (20 mg total) by mouth 2 (two) times daily. 10/08/20   Amariana Mirando, PA-C  furosemide (LASIX) 40 MG tablet Take 1 tablet (40 mg total) by mouth daily for 5 days. 06/28/19 07/03/19  [place] Donnamarie Poag, DO  meropenem 1 g in sodium chloride 0.9 % 100 mL Inject 1 g into the vein every 8 (eight) hours. Patient not taking: Reported on 10/08/2020 07/12/19    Dhungel, Flonnie Overman, MD  metoprolol tartrate (LOPRESSOR) 25 MG tablet Take 1 tablet (25 mg total) by mouth 2 (two) times daily. 06/28/19 06/27/20  [place] Donnamarie Poag, DO  morphine (MORPHINE) 2 mg/mL injection Inject 0.5 mLs (1 mg total) into the vein every 4 (four) hours. Patient not taking: Reported on 10/08/2020 07/12/19   Dhungel, Flonnie Overman, MD  Multiple Vitamin (MULTIVITAMIN) LIQD Place 15 mLs into feeding tube daily. Patient not taking: Reported on 10/08/2020 07/13/19   Dhungel, Flonnie Overman, MD  naproxen (NAPROSYN) 500 MG tablet Take 1 tablet (500 mg total) by mouth 2 (two) times daily. 10/08/20   Seyed Heffley, PA-C  Nutritional Supplements (FEEDING SUPPLEMENT, OSMOLITE 1.5 CAL,) LIQD Place 1,000 mLs into feeding tube continuous. Patient not taking: Reported on 10/08/2020 07/12/19   Dhungel, Flonnie Overman, MD    Allergies    Codeine, Other, and Fluphenazine hcl  Review of Systems   Review of Systems  Constitutional: Negative for appetite change, chills and fever.  HENT: Negative for ear pain, rhinorrhea, sneezing and sore throat.   Eyes: Negative for photophobia and visual disturbance.  Respiratory: Positive for cough (Chronic nonproductive), chest tightness and shortness of breath. Negative for wheezing.   Cardiovascular: Negative for chest pain and palpitations.  Gastrointestinal: Negative for abdominal pain, blood in stool, constipation, diarrhea, nausea and vomiting.  Genitourinary: Negative for dysuria, hematuria and urgency.  Musculoskeletal: Positive for myalgias.  Skin: Negative for rash.  Neurological: Negative for dizziness, weakness and light-headedness.       + Left arm paresthesias    Physical Exam Updated Vital Signs BP 125/67   Pulse 62   Temp 98.7 F (37.1 C) (Oral)   Resp 18   Ht 6\' 1"  (1.854 m)   Wt 87.1 kg   SpO2 95%   BMI 25.33 kg/m   Physical Exam Vitals and nursing note reviewed.  Constitutional:      General: He is not in acute distress.    Appearance: He is  well-developed.     Comments: Speaking in complete sentences without difficulty.  No signs of respiratory distress.  HENT:     Head: Normocephalic and atraumatic.     Nose: Nose normal.  Eyes:     General: No scleral icterus.       Right eye: No discharge.        Left eye: No discharge.     Conjunctiva/sclera: Conjunctivae normal.  Cardiovascular:     Rate and Rhythm: Normal rate and regular rhythm.     Heart sounds: Normal heart sounds. No murmur heard.  No friction rub. No gallop.   Pulmonary:     Effort: Pulmonary effort is normal. No respiratory distress.     Breath sounds: Normal breath sounds.  Chest:     Chest wall: Tenderness present.    Abdominal:  General: Bowel sounds are normal. There is no distension.     Palpations: Abdomen is soft.     Tenderness: There is no abdominal tenderness. There is no guarding.  Musculoskeletal:        General: Tenderness present. Normal range of motion.     Cervical back: Normal range of motion and neck supple. Spasms and tenderness present.       Back:     Right lower leg: No edema.     Left lower leg: No edema.     Comments: No lower extremity edema, erythema or calf tenderness bilaterally.  2+ DP pulse noted bilaterally.  No midline tenderness of the cervical spine.  Tenderness and spasm in the indicated area of the left paraspinal musculature of the cervical spine.  No left shoulder deformity, edema or erythema of joint.  Skin:    General: Skin is warm and dry.     Findings: No rash.  Neurological:     Mental Status: He is alert.     Sensory: No sensory deficit.     Motor: No weakness or abnormal muscle tone.     Coordination: Coordination normal.     Comments: Normal sensation to light touch of bilateral upper and lower extremities.  Equal grip strength bilaterally.  Normal range of motion of bilateral shoulders.     ED Results / Procedures / Treatments   Labs (all labs ordered are listed, but only abnormal results are  displayed) Labs Reviewed  BASIC METABOLIC PANEL - Abnormal; Notable for the following components:      Result Value   Glucose, Bld 107 (*)    All other components within normal limits  CBC - Abnormal; Notable for the following components:   Hemoglobin 17.4 (*)    HCT 52.1 (*)    All other components within normal limits  HEPATIC FUNCTION PANEL - Abnormal; Notable for the following components:   AST 13 (*)    All other components within normal limits  LIPASE, BLOOD  TROPONIN I (HIGH SENSITIVITY)  TROPONIN I (HIGH SENSITIVITY)    EKG EKG Interpretation  Date/Time:  Saturday October 08 2020 11:11:34 EST Ventricular Rate:  72 PR Interval:  150 QRS Duration: 92 QT Interval:  356 QTC Calculation: 389 R Axis:   36 Text Interpretation: Normal sinus rhythm Normal ECG Confirmed by Malvin Johns [id number]) on 10/08/2020 1:54:43 PM   Radiology DG Chest 2 View  Result Date: 10/08/2020 CLINICAL DATA:  Chest pain EXAM: CHEST - 2 VIEW COMPARISON:  07/12/2019 FINDINGS: The heart size and mediastinal contours are within normal limits. Both lungs are clear. The visualized skeletal structures are unremarkable. External artifact overlies the heart. Remote cholecystectomy. IMPRESSION: No active cardiopulmonary disease. Electronically Signed   By: Jerilynn Mages.  Shick M.D.   On: 10/08/2020 12:06    Procedures Procedures (including critical care time)  Medications Ordered in ED Medications  ibuprofen (ADVIL) tablet 600 mg (600 mg Oral Given 10/08/20 1432)  methocarbamol (ROBAXIN) tablet 500 mg (500 mg Oral Given 10/08/20 1432)  alum & mag hydroxide-simeth (MAALOX/MYLANTA) 200-200-20 MG/5ML suspension 30 mL (30 mLs Oral Given 10/08/20 1432)    ED Course  I have reviewed the triage vital signs and the nursing notes.  Pertinent labs & imaging results that were available during my care of the patient were reviewed by me and considered in my medical decision making (see chart for details).  Clinical  Course as of Oct 08 1553  Sat Oct 08, 2020  1415 Troponin  I (High Sensitivity): 3 [HK]  1443 Lipase: 34 [HK]  1546 Troponin I (High Sensitivity): <2 [HK]    Clinical Course User Index [HK] Delia Heady, PA-C   MDM Rules/Calculators/A&P                          49 year old male with a past medical history of GERD, idiopathic pancreatitis, IBS presenting to the ED with a chief complaint of chest pressure and left arm paresthesias.  Reports pain in his left upper arm with paresthesias rating rating down for the past 2 weeks.  He denies any known injury but may have pulled a muscle due to his constant work at his job.  Told to take a muscle relaxant urgent care when he was initially evaluated for his symptoms but has not taken it due to side effects.  For the past week has noticed chest pressure that has been constant.  It will sometimes worsen without specific aggravating or alleviating factor.  Some shortness of breath when the pain worsens.  Does report ongoing stress and anxiety related to several social factors.  Denies any abdominal pain, nausea or vomiting.  He has had a chronic cough he relates to his tobacco use that has not changed.  Reports bilateral lower extremity edema from time to time which improves with decreased salt intake and believes this is due to standing and walking for work.  No swelling currently.  On exam patient has tenderness of the chest wall.  No lower extremity edema, erythema or calf tenderness that concern me for DVT.  He is speaking in complete sentences without difficulty.  No signs of respiratory distress.  Lungs are clear to auscultation bilaterally.  He is not tachycardic, tachypneic or hypoxic.  No numbness or weakness noted on the left arm.  He does have a muscle spasm noted in the left paraspinal musculature near the cervical spine in the musculature.  He has normal range of motion of joints.  No prior neck surgeries.  No midline cervical spine tenderness.  EKG shows  normal sinus rhythm, no STEMI, no ischemic changes.  Chest x-ray is unremarkable.  CBC, BMP unremarkable.  Initial troponin is negative.  Due to his pain, history of pancreatitis we will add LFTs, lipase and delta troponin and reassess.  Will attempt to control symptoms with medications.  Patient with some improvement in his symptoms with medications given.  He continues to rest comfortably, remains hemodynamically stable.  Delta troponin, lipase and LFTs unremarkable.  Suspect radiculopathy as a cause of his paresthesias, chest wall pain versus anxiety as a cause of his chest discomfort.  His pain is reproducible on palpation.  He is PERC negative.  His work-up here is reassuring, doubt ACS as the cause of his symptoms.  I stressed the importance of establishing care with a primary care provider due to his chronic medical issues.  He is requesting a few days off from work to help with his anxiety and stress.  We will give Pepcid, NSAIDs and have him return for any worsening symptoms.  All imaging, if done today, including plain films, CT scans, and ultrasounds, independently reviewed by me, and interpretations confirmed via formal radiology reads.  Patient is hemodynamically stable, in NAD, and able to ambulate in the ED. Evaluation does not show pathology that would require ongoing emergent intervention or inpatient treatment. I explained the diagnosis to the patient. Pain has been managed and has no complaints prior to discharge.  Patient is comfortable with above plan and is stable for discharge at this time. All questions were answered prior to disposition. Strict return precautions for returning to the ED were discussed. Encouraged follow up with PCP.   An After Visit Summary was printed and given to the patient.   Portions of this note were generated with [employer]. Dictation errors may occur despite best attempts at proofreading.  Final Clinical Impression(s) / ED Diagnoses Final  diagnoses:  Cervical radiculopathy  Chest wall pain    Rx / DC Orders ED Discharge Orders         Ordered    naproxen (NAPROSYN) 500 MG tablet  2 times daily        10/08/20 1551    famotidine (PEPCID) 20 MG tablet  2 times daily        10/08/20 1551           Delia Heady, PA-C 10/08/20 1554    Malvin Johns, MD 10/08/20 2016

## 2020-10-08 NOTE — ED Triage Notes (Signed)
Patient complains of 2 weeks of intermittent cp and left arm tingling. States that the pain and tingling with exertion. Alert and oriented, NAD. States tingling from elbow to fingers

## 2020-10-08 NOTE — ED Notes (Signed)
Reviewed discharge instructions with patient and significant other. Follow-up care and medications reviewed. Patient and significant other verbalized understanding. Patient A&Ox4, VSS, and ambulatory with steady gait upon discharge.  °

## 2021-03-22 IMAGING — CT CT CHEST WITH CONTRAST
2 of 5 series · 12 of 36 positions shown, 15 images · IV contrast (Omni 300)
Comparison: 06/20/2019

CLINICAL DATA: Shortness of breath, acute on chronic pancreatitis
with multiple peripancreatic fluid collections, recurrent epigastric
pain

EXAM:
CT CHEST, ABDOMEN, AND PELVIS WITH CONTRAST
TECHNIQUE: Multidetector CT imaging of the chest, abdomen and pelvis was
performed following the standard protocol during bolus
administration of intravenous contrast.
CONTRAST:  100mL OMNIPAQUE IOHEXOL 300 MG/ML SOLN, additional oral
enteric contrast

[Series 3: cap with 5mm st · axial · 0.98mm/px · z∈[+820,+1410]mm · 9 of 142 slices shown, 12 images]
[im 12/142  mediastinal]
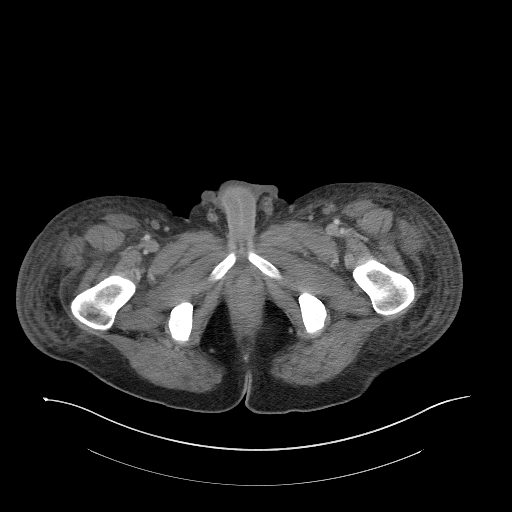
[im 12/142  lung]
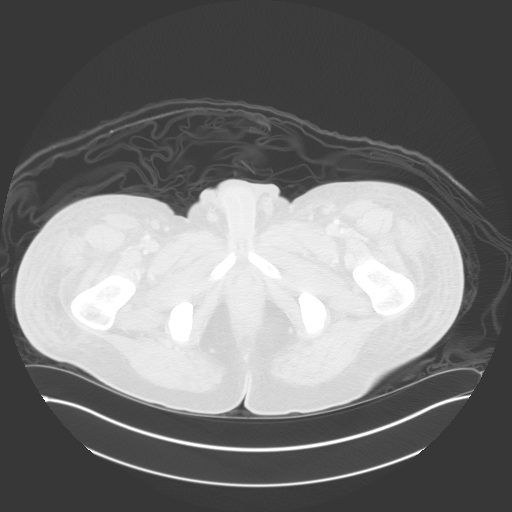
[im 24/142  lung]
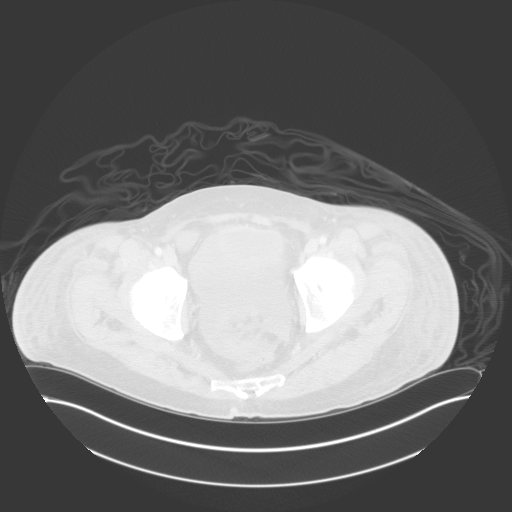
[im 48/142  lung]
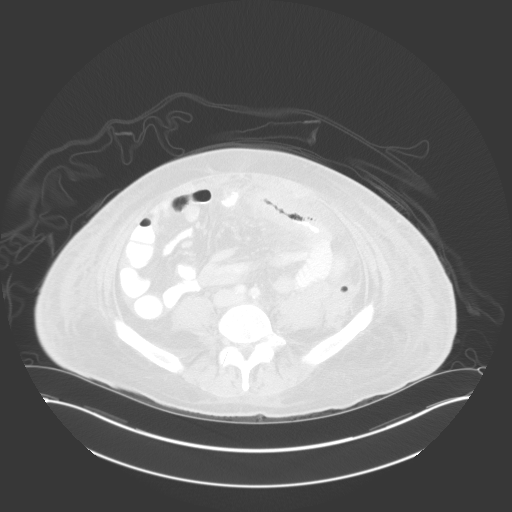
[im 59/142  lung]
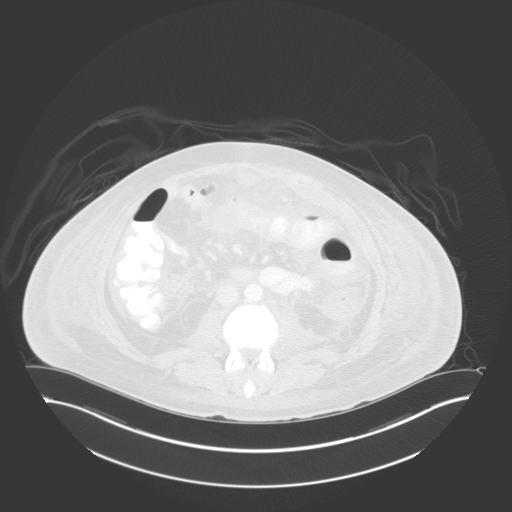
[im 71/142  mediastinal]
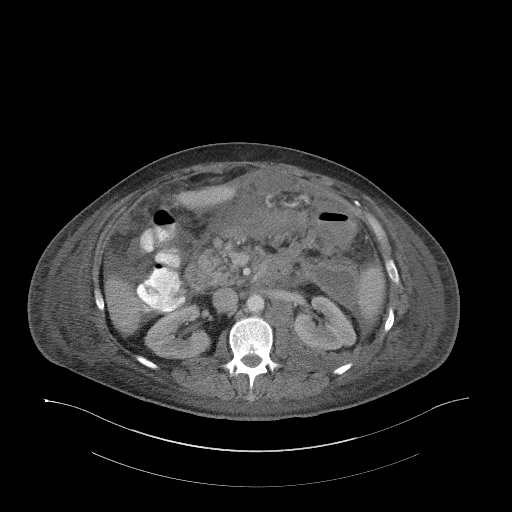
[im 71/142  lung]
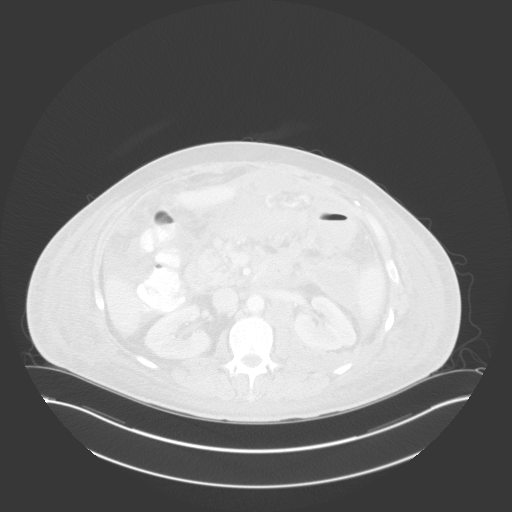
[im 83/142  lung]
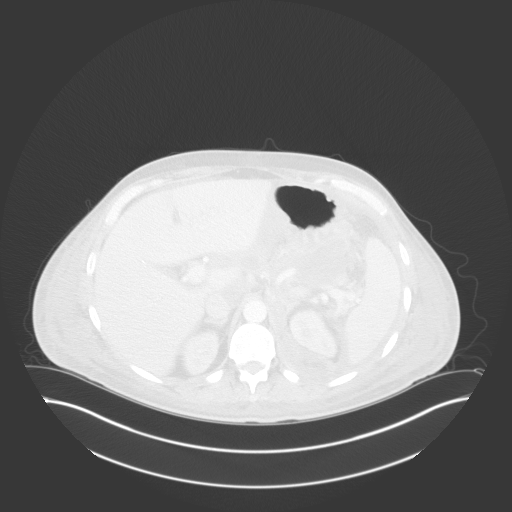
[im 95/142  lung]
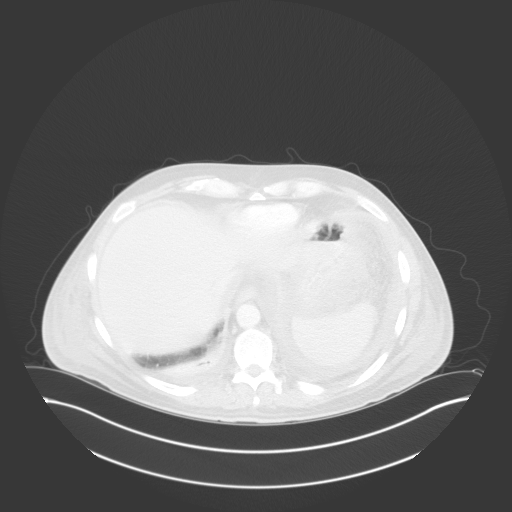
[im 118/142  lung]
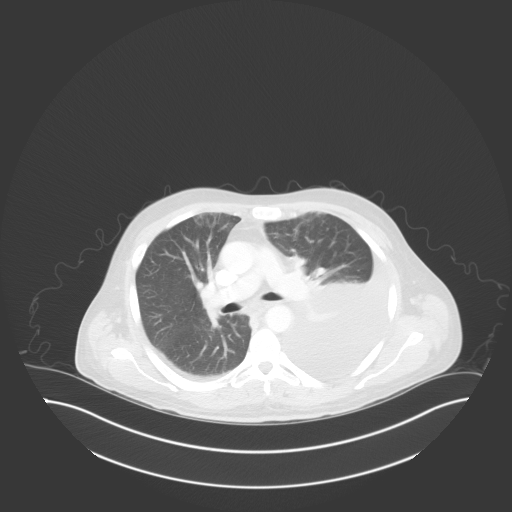
[im 130/142  mediastinal]
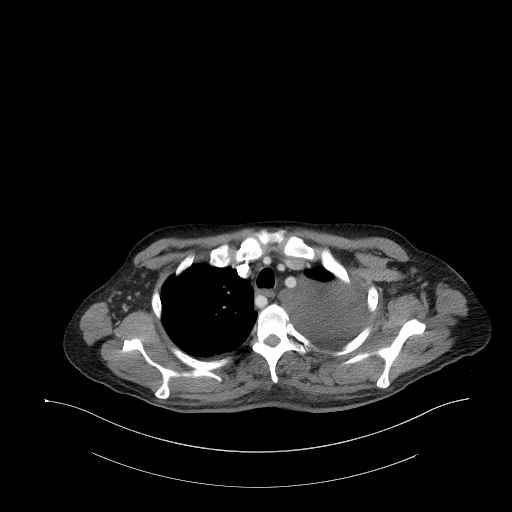
[im 130/142  lung]
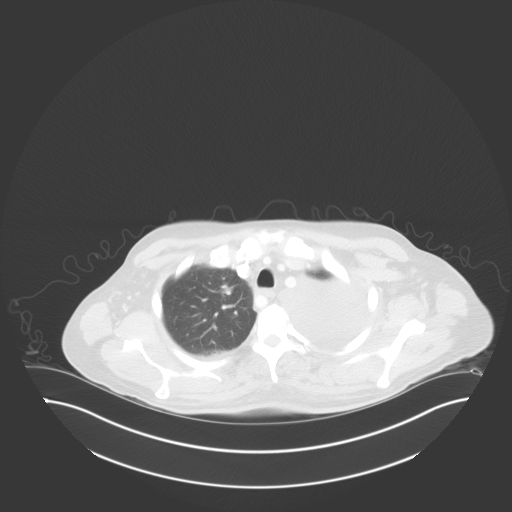

[Series 8: cap with 3mm st cor · coronal · 0.96mm/px · 3 of 155 slices shown]
[im 31/155  lung]
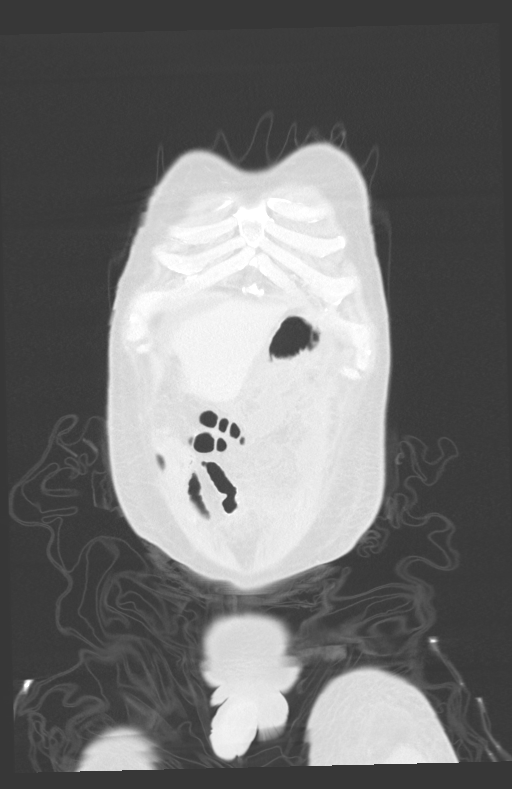
[im 62/155  lung]
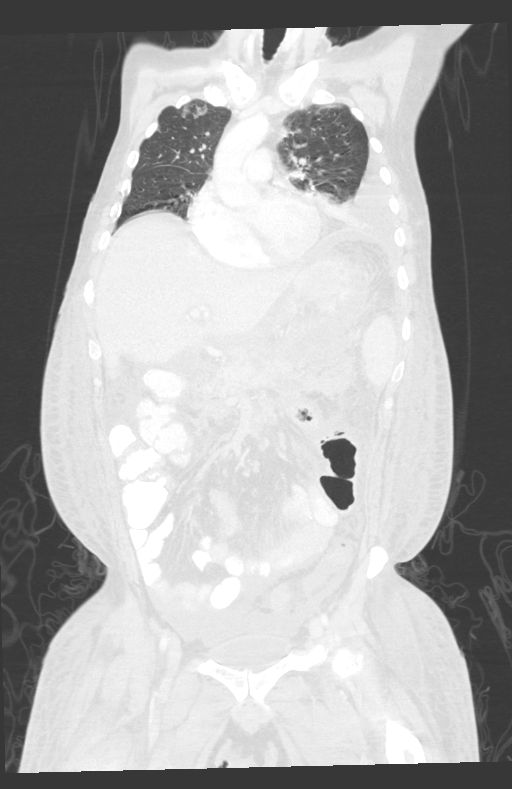
[im 93/155  lung]
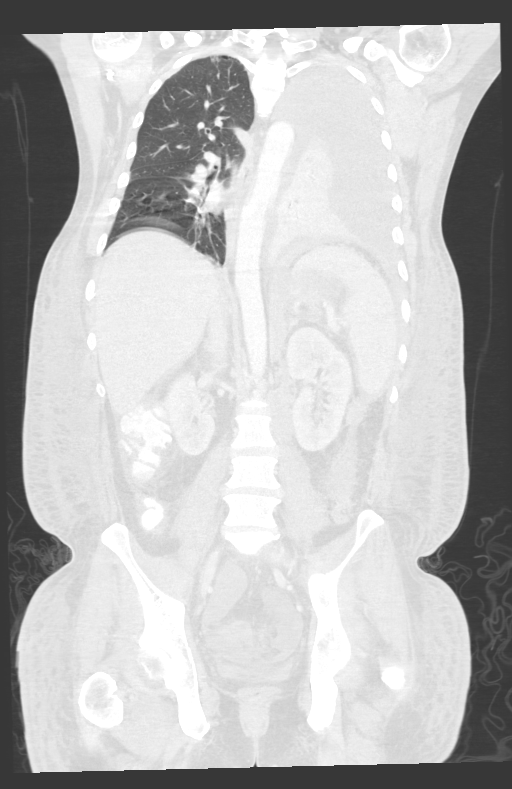

[12 of 36 positions shown; findings below may reference images not displayed]

FINDINGS: CT CHEST FINDINGS

Cardiovascular: Incidental note of aberrant retroesophageal origin
of the right subclavian artery. Normal heart size. No pericardial
effusion.

Mediastinum/Nodes: No enlarged mediastinal, hilar, or axillary lymph
nodes. Thyroid gland, trachea, and esophagus demonstrate no
significant findings.

Lungs/Pleura: There is a large left pleural effusion with associated
atelectasis or consolidation, enlarged compared to prior
examination. There is a new, trace right pleural effusion. There are
scattered subpleural ground-glass pulmonary opacities, most
conspicuous in the right upper lobe.

Musculoskeletal: No chest wall mass or suspicious bone lesions
identified.

CT ABDOMEN PELVIS FINDINGS

Hepatobiliary: No focal liver abnormality is seen. Status post
cholecystectomy. No biliary dilatation.

Pancreas: Redemonstrated extensive retroperitoneal inflammation and
multiple peripancreatic fluid collections. There is a new discrete
fluid collection or component adjacent to the greater curvature of
the stomach and spleen measuring approximately 6.2 x 2.7 cm (series
3, image 52). There is an additional new fluid collection within or
adjacent to the omentum measuring 4.7 x 2.5 cm (series 3, image 75).
There is an additional new fluid collection within the small bowel
mesentery measuring 5.0 x 3.2 cm (series 3, image 86). Other fluid
collections are not significantly changed, for example anterior to
the pancreatic neck (series 3, image 67), adjacent to the porta
hepatis (series 3, image 67), adjacent to the left kidney and
splenic flexure (series 3, image 73), and posterior to the left
kidney (series 3, image 65).

Spleen: Splenomegaly, maximum span 14.4 cm.

Adrenals/Urinary Tract: Adrenal glands are unremarkable. Kidneys are
normal, without renal calculi, solid lesion, or hydronephrosis.
Bladder is unremarkable.

Stomach/Bowel: Stomach is within normal limits. Appendix appears
normal. Inflammatory thickening of the transverse colon (series 3,
image 81), similar to prior examination, and of the mid small bowel,
particularly a segment in the anterior abdomen (series 3, image 95).

Vascular/Lymphatic: Aortic atherosclerosis. No enlarged abdominal or
pelvic lymph nodes.

Reproductive: No mass or other abnormality.

Other: Extensive anasarca. Small volume ascites, similar to prior
examination.

Musculoskeletal: No acute or significant osseous findings.
IMPRESSION: 1. There is a large left pleural effusion with associated
atelectasis or consolidation, enlarged compared to prior
examination. There is a new, trace right pleural effusion.

2. There are scattered subpleural ground-glass pulmonary opacities,
most conspicuous in the right upper lobe. These are nonspecific and
infectious or inflammatory.

3. Redemonstrated extensive retroperitoneal inflammation and
multiple peripancreatic fluid collections. There is a new discrete
fluid collection or component adjacent to the greater curvature of
the stomach and spleen measuring approximately 6.2 x 2.7 cm (series
3, image 52). There is an additional new fluid collection within or
adjacent to the omentum measuring 4.7 x 2.5 cm (series 3, image 75).
There is an additional new fluid collection within the small bowel
mesentery measuring 5.0 x 3.2 cm (series 3, image 86).

4. Other fluid collections are not significantly changed, for
example anterior to the pancreatic neck (series 3, image 67),
adjacent to the porta hepatis (series 3, image 67), adjacent to the
left kidney and splenic flexure (series 3, image 73), and posterior
to the left kidney (series 3, image 65).

5. Inflammatory thickening of the transverse colon (series 3, image
81), similar to prior examination, and of the mid small bowel,
particularly a segment in the anterior abdomen (series 3, image 95).

6. Extensive anasarca. Small volume ascites, similar to prior
examination.

## 2021-03-23 IMAGING — DX CHEST  1 VIEW
1 series · 1 of 1 positions shown · non-contrast
Comparison: 06/24/2019

CLINICAL DATA: Status post thoracentesis.

EXAM:
CHEST  1 VIEW

[chest ap]
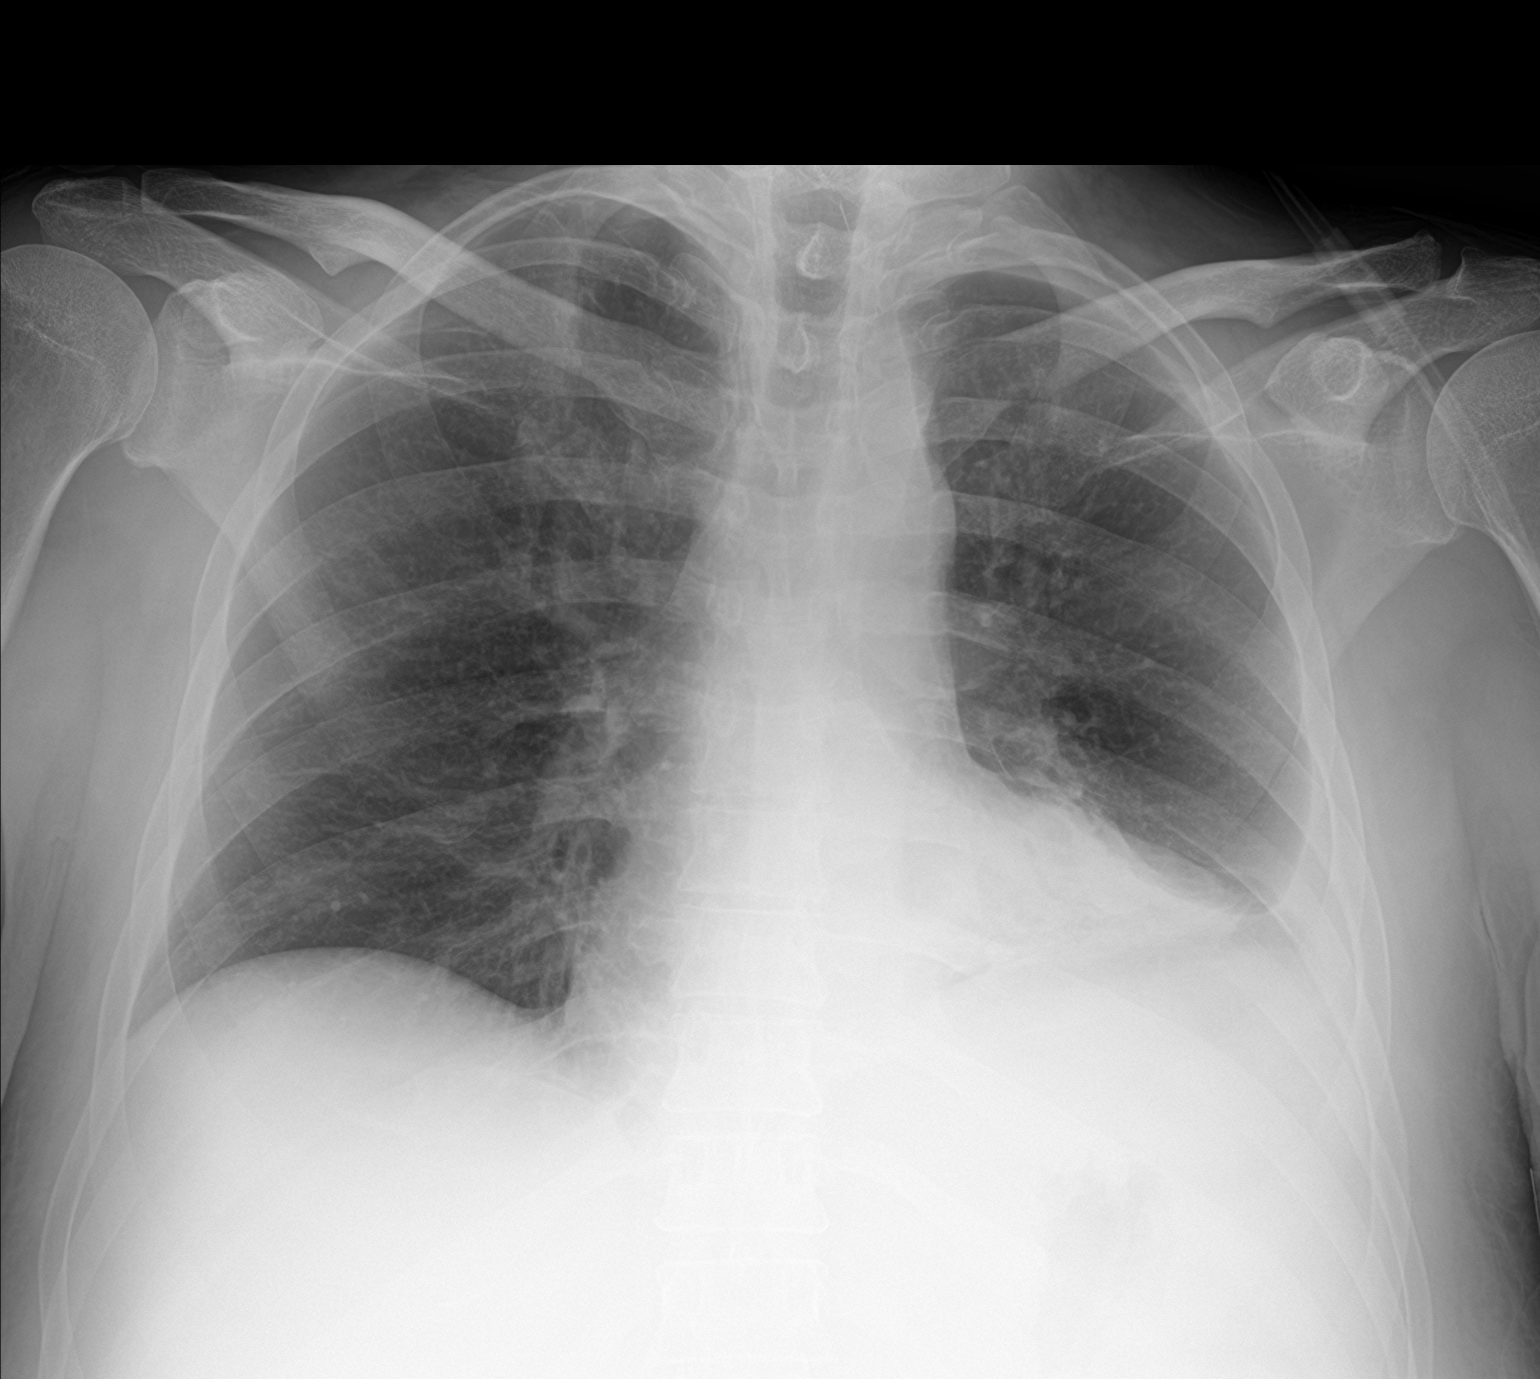

[1 of 1 positions shown; findings below may reference images not displayed]

FINDINGS: Interval near complete evacuation of the left pleural fluid
collection. A small residual effusion remains. No evidence of a
postprocedural pneumothorax. Left basilar atelectasis is noted. The
right lung is clear. No right-sided effusion.
IMPRESSION: Interval near complete evacuation of the left pleural fluid
collection with left lower lobe atelectasis.

No postprocedural pneumothorax.
